# Patient Record
Sex: Male | Born: 1955
Health system: Southern US, Community
[De-identification: ages and names within clinical notes are randomized; demographics above are authoritative.]

## PROBLEM LIST (undated history)

## (undated) DIAGNOSIS — K269 Duodenal ulcer, unspecified as acute or chronic, without hemorrhage or perforation: Principal | ICD-10-CM

## (undated) DIAGNOSIS — H269 Unspecified cataract: Secondary | ICD-10-CM

## (undated) DIAGNOSIS — I2510 Atherosclerotic heart disease of native coronary artery without angina pectoris: Secondary | ICD-10-CM

## (undated) DIAGNOSIS — D126 Benign neoplasm of colon, unspecified: Secondary | ICD-10-CM

## (undated) DIAGNOSIS — K259 Gastric ulcer, unspecified as acute or chronic, without hemorrhage or perforation: Secondary | ICD-10-CM

## (undated) DIAGNOSIS — Z86010 Personal history of colonic polyps: Secondary | ICD-10-CM

## (undated) DIAGNOSIS — B9681 Helicobacter pylori [H. pylori] as the cause of diseases classified elsewhere: Secondary | ICD-10-CM

## (undated) DIAGNOSIS — B360 Pityriasis versicolor: Secondary | ICD-10-CM

## (undated) DIAGNOSIS — E785 Hyperlipidemia, unspecified: Secondary | ICD-10-CM

## (undated) DIAGNOSIS — I219 Acute myocardial infarction, unspecified: Secondary | ICD-10-CM

## (undated) HISTORY — DX: Gastric ulcer, unspecified as acute or chronic, without hemorrhage or perforation: K25.9

## (undated) HISTORY — DX: Personal history of colonic polyps: Z86.010

## (undated) HISTORY — DX: Benign neoplasm of colon, unspecified: D12.6

## (undated) HISTORY — PX: COLONOSCOPY W/ POLYPECTOMY: SHX1380

## (undated) HISTORY — PX: POLYPECTOMY: SHX149

## (undated) HISTORY — DX: Unspecified cataract: H26.9

## (undated) HISTORY — DX: Pityriasis versicolor: B36.0

## (undated) HISTORY — DX: Hyperlipidemia, unspecified: E78.5

## (undated) HISTORY — PX: COLONOSCOPY: SHX174

## (undated) HISTORY — DX: Duodenal ulcer, unspecified as acute or chronic, without hemorrhage or perforation: K26.9

## (undated) HISTORY — PX: CORONARY STENT PLACEMENT: SHX1402

## (undated) HISTORY — DX: Helicobacter pylori (H. pylori) as the cause of diseases classified elsewhere: B96.81

## (undated) HISTORY — DX: Atherosclerotic heart disease of native coronary artery without angina pectoris: I25.10

## (undated) HISTORY — PX: OTHER SURGICAL HISTORY: SHX169

---
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
## 2007-11-05 ENCOUNTER — Ambulatory Visit: Payer: Self-pay | Admitting: Internal Medicine

## 2007-11-05 DIAGNOSIS — B360 Pityriasis versicolor: Secondary | ICD-10-CM | POA: Insufficient documentation

## 2007-11-05 DIAGNOSIS — E785 Hyperlipidemia, unspecified: Secondary | ICD-10-CM

## 2007-11-08 LAB — CONVERTED CEMR LAB
Cholesterol: 238 mg/dL (ref 0–200)
HDL: 31.6 mg/dL — ABNORMAL LOW (ref >39.0)
Total CHOL/HDL Ratio: 7.5
Triglycerides: 355 mg/dL (ref 0–149)

## 2008-08-03 ENCOUNTER — Ambulatory Visit: Payer: Self-pay | Admitting: Family Medicine

## 2008-08-09 ENCOUNTER — Ambulatory Visit: Payer: Self-pay | Admitting: Family Medicine

## 2008-11-24 DIAGNOSIS — D126 Benign neoplasm of colon, unspecified: Secondary | ICD-10-CM

## 2008-11-24 HISTORY — DX: Benign neoplasm of colon, unspecified: D12.6

## 2008-12-22 ENCOUNTER — Ambulatory Visit: Payer: Self-pay | Admitting: Internal Medicine

## 2008-12-28 ENCOUNTER — Ambulatory Visit: Payer: Self-pay | Admitting: Internal Medicine

## 2008-12-28 LAB — CONVERTED CEMR LAB: Fecal Occult Bld: POSITIVE — AB

## 2009-01-15 ENCOUNTER — Ambulatory Visit: Payer: Self-pay | Admitting: Gastroenterology

## 2009-01-26 ENCOUNTER — Encounter: Payer: Self-pay | Admitting: Gastroenterology

## 2009-01-26 ENCOUNTER — Ambulatory Visit: Payer: Self-pay | Admitting: Gastroenterology

## 2009-01-26 DIAGNOSIS — Z86010 Personal history of colonic polyps: Secondary | ICD-10-CM

## 2009-01-26 DIAGNOSIS — Z860101 Personal history of adenomatous and serrated colon polyps: Secondary | ICD-10-CM | POA: Insufficient documentation

## 2009-01-26 HISTORY — DX: Personal history of adenomatous and serrated colon polyps: Z86.0101

## 2009-01-26 HISTORY — DX: Personal history of colonic polyps: Z86.010

## 2009-01-26 LAB — HM COLONOSCOPY

## 2009-02-01 ENCOUNTER — Encounter: Payer: Self-pay | Admitting: Gastroenterology

## 2009-08-03 ENCOUNTER — Telehealth: Payer: Self-pay | Admitting: Internal Medicine

## 2009-11-02 ENCOUNTER — Ambulatory Visit: Payer: Self-pay | Admitting: Family Medicine

## 2009-11-12 ENCOUNTER — Ambulatory Visit: Payer: Self-pay | Admitting: Family Medicine

## 2010-01-14 ENCOUNTER — Ambulatory Visit: Payer: Self-pay | Admitting: Internal Medicine

## 2010-01-16 LAB — CONVERTED CEMR LAB: Glucose, Bld: 80 mg/dL (ref 70–99)

## 2010-03-05 ENCOUNTER — Telehealth: Payer: Self-pay | Admitting: Internal Medicine

## 2010-03-05 ENCOUNTER — Encounter: Payer: Self-pay | Admitting: Internal Medicine

## 2010-03-11 ENCOUNTER — Ambulatory Visit: Payer: Self-pay | Admitting: Internal Medicine

## 2010-03-11 ENCOUNTER — Encounter (INDEPENDENT_AMBULATORY_CARE_PROVIDER_SITE_OTHER): Payer: Self-pay | Admitting: *Deleted

## 2010-03-12 ENCOUNTER — Encounter: Payer: Self-pay | Admitting: Internal Medicine

## 2010-03-12 LAB — CONVERTED CEMR LAB: Fecal Occult Bld: NEGATIVE

## 2010-08-28 ENCOUNTER — Telehealth: Payer: Self-pay | Admitting: Gastroenterology

## 2010-12-05 ENCOUNTER — Encounter (INDEPENDENT_AMBULATORY_CARE_PROVIDER_SITE_OTHER): Payer: Self-pay | Admitting: *Deleted

## 2010-12-17 ENCOUNTER — Encounter (INDEPENDENT_AMBULATORY_CARE_PROVIDER_SITE_OTHER): Payer: Self-pay | Admitting: *Deleted

## 2010-12-19 ENCOUNTER — Ambulatory Visit
Admission: RE | Admit: 2010-12-19 | Discharge: 2010-12-19 | Payer: Self-pay | Source: Home / Self Care | Attending: Gastroenterology | Admitting: Gastroenterology

## 2010-12-24 NOTE — Letter (Signed)
Summary: Results Follow up Letter  [place] at [facility]  [address] [place], [place] [address]   Phone: [phone]  Fax: [phone]    03/12/2010 MRN: [id number]  James [name] [address] [place], [place]  [address]  Dear Mr. ATTIG,  The following are the results of your recent test(s):  Test         Result    Pap Smear:        Normal _____  Not Normal _____ Comments: ______________________________________________________ Cholesterol: LDL(Bad cholesterol):         Your goal is less than:         HDL (Good cholesterol):       Your goal is more than: Comments:  ______________________________________________________ Mammogram:        Normal _____  Not Normal _____ Comments:  ___________________________________________________________________ Hemoccult:        Normal __X___  Not normal _______ Comments: stool test negative for blood, will plan to repeat this next year   _____________________________________________________________________ Other Tests:    We routinely do not discuss normal results over the telephone.  If you desire a copy of the results, or you have any questions about this information we can discuss them at your next office visit.   Sincerely,      Tillman Abide, MD

## 2010-12-24 NOTE — Progress Notes (Signed)
Summary: Stool test request  Phone Note Call from Patient Call back at Home Phone [phone]   Caller: Spouse Call For: [name] MD Summary of Call: Dr. Alphonsus Sias:  My husband, James [name], has yet to make an appointment for his second colonoscopy that was recommended by his GI doctor.  It took him taking that screening test last year and finding blood for him to have the first one.  So could you order a screening test for him again, so we can see if he is still passing blood.  Because if he is, this will move him to have the second colonoscopy.  Thank you in advance for all your help. Initial call taken by: Carin Primrose,  March 05, 2010 10:49 AM Caller: Carin Primrose  Follow-up for Phone Call        order done Follow-up by: [name] MD,  March 05, 2010 11:06 AM

## 2010-12-24 NOTE — Progress Notes (Signed)
Summary: Schedule Colonoscopy  Phone Note Outgoing Call Call back at [facility] Phone [phone]   Call placed by: Harlow Mares CMA Duncan Dull),  August 28, 2010 11:22 AM Call placed to: Patient Summary of Call: Left a message on patients machine to call back. patient is due for her colonoscopy Initial call taken by: Harlow Mares CMA Duncan Dull),  August 28, 2010 11:22 AM  Follow-up for Phone Call        Left a message on the patient machine to call back and schedule a previsit and procedure with our office. A letter will be mailed to the patient.   Follow-up by: Harlow Mares CMA Duncan Dull),  September 06, 2010 11:23 AM

## 2010-12-24 NOTE — Letter (Signed)
Summary: [facility]: Immunoassay Fecal Occult Blood (iFOB) Order Form  [place] at [facility]  [address] [place], [place] [address]   Phone: [phone]  Fax: [phone]      [facility]: Immunoassay Fecal Occult Blood (iFOB) Order Form   March 05, 2010 MRN: [id number]   BLAYDE BACIGALUPI 10-15-1956   Physicican Name:________[place]_________________  Diagnosis Code:________V76.51__________________      [name] MD

## 2010-12-24 NOTE — Assessment & Plan Note (Signed)
Summary: CPX / LFW   Vital Signs:  Patient profile:   55 year old male Weight:      200 pounds BMI:     28.39 Temp:     98.3 degrees F oral Pulse rate:   72 / minute Pulse rhythm:   regular BP sitting:   120 / 70  (left arm) Cuff size:   large  Vitals Entered By: Mervin Hack CMA Duncan Dull) (January 14, 2010 3:03 PM) CC: adult physical   History of Present Illness: doing okay Due for another colonoscopy this year due to multiple polyps  Allergies: No Known Drug Allergies  Past History:  Past medical, surgical, family and social histories (including risk factors) reviewed for relevance to current acute and chronic problems.  Past Medical History: Reviewed history from 12/22/2008 and no changes required. Hyperlipidemia Tinea Versicolor  Past Surgical History: Reviewed history from 11/05/2007 and no changes required. 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 Mom and Dad in good health 4 sisters No CAD, HTN, DM No colon or prostate cancer  Social History: Reviewed history from 11/05/2007 and no changes required. 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 Eyes:  Denies double vision, eye irritation, and vision loss-1 eye; stye did resolve. ENT:  Denies decreased hearing and ringing in ears; teeth okay--regular with dentist. CV:  Denies chest pain or discomfort, difficulty breathing at night, difficulty breathing while lying down, fainting, lightheadness, palpitations, and shortness of breath with exertion. Resp:  Denies cough and shortness of breath. GI:  Complains of change in bowel habits; denies abdominal pain, bloody stools, dark tarry stools, indigestion,  nausea, and vomiting; bowels took a long time to come back to normal after the colonoscopy. GU:  Complains of nocturia; denies erectile dysfunction, urinary frequency, and urinary hesitancy; only occ nocturia. MS:  Denies joint pain and joint swelling; some knee cracking  over the last 3 years . Derm:  Denies lesion(s) and rash; lump on left calf a while ago--then it resolved. Neuro:  Denies headaches, numbness, tingling, and weakness. Psych:  Denies anxiety and depression. Heme:  Denies abnormal bruising and enlarge lymph nodes. Allergy:  Denies seasonal allergies and sneezing; occ congestion since moving to [place].  Physical Exam  General:  alert and normal appearance.   Eyes:  pupils equal, pupils round, pupils reactive to light, and no optic disk abnormalities.   Ears:  R ear normal and L ear normal.   Mouth:  no erythema and no lesions.   Neck:  supple, no masses, no thyromegaly, no carotid bruits, and no cervical lymphadenopathy.   Lungs:  normal respiratory effort and normal breath sounds.   Heart:  normal rate, regular rhythm, no murmur, and no gallop.   Abdomen:  soft, non-tender, and no masses.   Rectal:  deferred--due for repeat colonoscopy Msk:  no joint tenderness and no joint swelling.   Pulses:  1+ in feet Extremities:  no edema Neurologic:  alert & oriented X3, strength normal in all extremities, and gait normal.   Skin:  no rashes and no suspicious lesions.   Axillary Nodes:  No palpable lymphadenopathy Psych:  normally interactive, good eye contact, not anxious appearing, and not depressed appearing.     Impression & Recommendations:  Problem #  1:  EXAMINATION, ROUTINE MEDICAL (ICD-V70.0) Assessment Comment Only  doing well discussed fitness will check PSA and glucose  Orders: TLB-Glucose, QUANT (82947-GLU)  Problem # 2:  HYPERLIPIDEMIA (ICD-272.4) Assessment: Comment Only discussed lifestyle measures no meds for now    HDL:31.6 (11/05/2007)   LDL:DEL (11/05/2007)  Chol:238 (11/05/2007)  Trig:355 (11/05/2007)  Other Orders: Venipuncture ([address]) TLB-PSA (Prostate Specific Antigen) (84153-PSA)  Patient Instructions: 1)  Please schedule a follow-up appointment in 1-2  years.   Prior Medications: Current Allergies (reviewed today): No known allergies

## 2010-12-24 NOTE — Letter (Signed)
Summary: Colonoscopy Letter  [facility]  [address] [place], [place] [address]   Phone: [phone]  Fax: [phone]      March 11, 2010 MRN: [id number]   James [name] [address] [place], [place]  [address]   Dear [name],   According to your medical record, it is time for you to schedule a Colonoscopy. The American Cancer Society recommends this procedure as a method to detect early colon cancer. Patients with a family history of colon cancer, or a personal history of colon polyps or inflammatory bowel disease are at increased risk.  This letter has beeen generated based on the recommendations made at the time of your procedure. If you feel that in your particular situation this may no longer apply, please contact our office.  Please call our office at [phone] to schedule this appointment or to update your records at your earliest convenience.  Thank you for cooperating with [place] to provide you with the very best care possible.   Sincerely,  Barbette Hair. Arlyce Dice, M.D.  [facility] [phone]

## 2010-12-26 ENCOUNTER — Encounter: Payer: Self-pay | Admitting: Internal Medicine

## 2010-12-26 NOTE — Letter (Signed)
Summary: Pre Visit Letter Revised  [facility]  [address] [place], [place] [address]   Phone: [phone]  Fax: [phone]        12/05/2010 MRN: [id number] THEORDORE CISNERO [address] [place], [place]  [address]             Procedure Date:  01-02-11   Welcome to the Gastroenterology Division at [facility].    You are scheduled to see a nurse for your pre-procedure visit on 12-19-10 at 8:00a.m. on the 3rd floor at [facility], [address]  We ask that you try to arrive at our office 15 minutes prior to your appointment time to allow for check-in.  Please take a minute to review the attached form.  If you answer "Yes" to one or more of the questions on the first page, we ask that you call the person listed at your earliest opportunity.  If you answer "No" to all of the questions, please complete the rest of the form and bring it to your appointment.    Your nurse visit will consist of discussing your medical and surgical history, your immediate family medical history, and your medications.   If you are unable to list all of your medications on the form, please bring the medication bottles to your appointment and we will list them.  We will need to be aware of both prescribed and over the counter drugs.  We will need to know exact dosage information as well.    Please be prepared to read and sign documents such as consent forms, a financial agreement, and acknowledgement forms.  If necessary, and with your consent, a friend or relative is welcome to sit-in on the nurse visit with you.  Please bring your insurance card so that we may make a copy of it.  If your insurance requires a referral to see a specialist, please bring your referral form from your primary care physician.  No co-pay is required for this nurse visit.     If you cannot keep your appointment, please call [phone] to cancel or reschedule prior to your appointment date.  This allows  [place] the opportunity to schedule an appointment for another patient in need of care.    Thank you for choosing [facility] for your medical needs.  We appreciate the opportunity to care for you.  Please visit [place] at our website  to learn more about our practice.  Sincerely, The Gastroenterology Division

## 2010-12-26 NOTE — Letter (Signed)
Summary: [facility] Instructions  [facility]  [address] [place], [place] [address]   Phone: [phone]  Fax: [phone]       James [name]    [birth year]    MRN: [id number]        Procedure Day /Date:  Thursday 01/02/2011      Arrival Time:  9:00 am      Procedure Time: 10:00 am     Location of Procedure:                    _x _  [facility] (4th Floor)                        PREPARATION FOR COLONOSCOPY WITH MOVIPREP   Starting 5 days prior to your procedure Saturday 2/4 do not eat nuts, seeds, popcorn, corn, beans, peas,  salads, or any raw vegetables.  Do not take any fiber supplements (e.g. Metamucil, Citrucel, and Benefiber).  THE DAY BEFORE YOUR PROCEDURE         DATE: Wednesday 2/8  1.  Drink clear liquids the entire day-NO SOLID FOOD  2.  Do not drink anything colored red or purple.  Avoid juices with pulp.  No orange juice.  3.  Drink at least 64 oz. (8 glasses) of fluid/clear liquids during the day to prevent dehydration and help the prep work efficiently.  CLEAR LIQUIDS INCLUDE: Water Jello Ice Popsicles Tea (sugar ok, no milk/cream) Powdered fruit flavored drinks Coffee (sugar ok, no milk/cream) Gatorade Juice: apple, white grape, white cranberry  Lemonade Clear bullion, consomm, broth Carbonated beverages (any kind) Strained chicken noodle soup Hard Candy                             4.  In the morning, mix first dose of MoviPrep solution:    Empty 1 Pouch A and 1 Pouch B into the disposable container    Add lukewarm drinking water to the top line of the container. Mix to dissolve    Refrigerate (mixed solution should be used within 24 hrs)  5.  Begin drinking the prep at 5:00 p.m. The MoviPrep container is divided by 4 marks.   Every 15 minutes drink the solution down to the next mark (approximately 8 oz) until the full liter is complete.   6.  Follow completed prep with 16 oz of clear liquid of your choice  (Nothing red or purple).  Continue to drink clear liquids until bedtime.  7.  Before going to bed, mix second dose of MoviPrep solution:    Empty 1 Pouch A and 1 Pouch B into the disposable container    Add lukewarm drinking water to the top line of the container. Mix to dissolve    Refrigerate  THE DAY OF YOUR PROCEDURE      DATE: Wednesday 2/8  Beginning at 5:00 a.m. (5 hours before procedure):         1. Every 15 minutes, drink the solution down to the next mark (approx 8 oz) until the full liter is complete.  2. Follow completed prep with 16 oz. of clear liquid of your choice.    3. You may drink clear liquids until 8:00 am (2 HOURS BEFORE PROCEDURE).   MEDICATION INSTRUCTIONS  Unless otherwise instructed, you should take regular prescription medications with a small sip of water   as early as possible the  morning of your procedure.           OTHER INSTRUCTIONS  You will need a responsible adult at least 57 years of age to accompany you and drive you home.   This person must remain in the waiting room during your procedure.  Wear loose fitting clothing that is easily removed.  Leave jewelry and other valuables at home.  However, you may wish to bring a book to read or  an iPod/MP3 player to listen to music as you wait for your procedure to start.  Remove all body piercing jewelry and leave at home.  Total time from sign-in until discharge is approximately 2-3 hours.  You should go home directly after your procedure and rest.  You can resume normal activities the  day after your procedure.  The day of your procedure you should not:   Drive   Make legal decisions   Operate machinery   Drink alcohol   Return to work  You will receive specific instructions about eating, activities and medications before you leave.    The above instructions have been reviewed and explained to me by   Ezra Sites RN  December 19, 2010 8:08 AM     I fully understand  and can verbalize these instructions _____________________________ Date _________

## 2010-12-26 NOTE — Miscellaneous (Signed)
Summary: [facility]  Clinical Lists Changes  Medications: Added new medication of MOVIPREP 100 GM  SOLR (PEG-KCL-NACL-NASULF-NA ASC-C) As per prep instructions. - Signed Rx of MOVIPREP 100 GM  SOLR (PEG-KCL-NACL-NASULF-NA ASC-C) As per prep instructions.;  #1 x 0;  Signed;  Entered by: Ezra Sites RN;  Authorized by: Louis Meckel MD;  Method used: Electronically to [facility] [address], [place], [place], [place]  [address], Ph: [phone], Fax: [phone] Observations: Added new observation of NKA: T (12/19/2010 7:55)    Prescriptions: MOVIPREP 100 GM  SOLR (PEG-KCL-NACL-NASULF-NA ASC-C) As per prep instructions.  #1 x 0   Entered by:   Ezra Sites RN   Authorized by:   Louis Meckel MD   Signed by:   Ezra Sites RN on 12/19/2010   Method used:   Electronically to        [facility] (retail)       [address], [address]       [place], [place]  [address]       Ph: [phone]       Fax: [phone]   RxID:   [id number]

## 2011-01-02 ENCOUNTER — Other Ambulatory Visit: Payer: Self-pay | Admitting: Gastroenterology

## 2011-01-02 ENCOUNTER — Other Ambulatory Visit (AMBULATORY_SURGERY_CENTER): Payer: BC Managed Care – PPO | Admitting: Gastroenterology

## 2011-01-02 DIAGNOSIS — D126 Benign neoplasm of colon, unspecified: Secondary | ICD-10-CM

## 2011-01-02 DIAGNOSIS — Z1211 Encounter for screening for malignant neoplasm of colon: Secondary | ICD-10-CM

## 2011-01-02 DIAGNOSIS — Z86010 Personal history of colonic polyps: Secondary | ICD-10-CM

## 2011-01-09 ENCOUNTER — Encounter: Payer: Self-pay | Admitting: Gastroenterology

## 2011-01-09 NOTE — Procedures (Addendum)
Summary: Colonoscopy  Patient: James [name] Note: All result statuses are Final unless otherwise noted.  Tests: (1) Colonoscopy (COL)   COL Colonoscopy           DONE     [facility]     [address]     [place], [place]  [address]           COLONOSCOPY PROCEDURE REPORT           PATIENT:  James [name]  MR#:  [id number]     BIRTHDATE:  1956/02/28, 54 yrs. old  GENDER:  male           ENDOSCOPIST:  Barbette Hair. Arlyce Dice, MD     Referred by:           PROCEDURE DATE:  01/02/2011     PROCEDURE:  Colonoscopy with polypectomy and submucosal injection,     Colonoscopy with snare polypectomy     ASA CLASS:  Class II     INDICATIONS:  1) screening  2) history of pre-cancerous     (adenomatous) colon polyps Index polypectomy 2010           MEDICATIONS:   Fentanyl 50 mcg IV, Versed 8 mg IV           DESCRIPTION OF PROCEDURE:   After the risks benefits and     alternatives of the procedure were thoroughly explained, informed     consent was obtained.  Digital rectal exam was performed and     revealed no abnormalities.   The LB 180AL [id number] endoscope was     introduced through the anus and advanced to the cecum, which was     identified by both the appendix and ileocecal valve, without     limitations.  The quality of the prep was Moviprep fair.  The     instrument was then slowly withdrawn as the colon was fully     examined.<<PROCEDUREIMAGES>>           FINDINGS:  A sessile polyp was found in the descending colon. It     was 2.5 cm in size. submucosal injection 8cc NS injected, followed     by piecemeal polypectomy with hot polypectomy snare (see image10     and image14).  A sessile polyp was found in the sigmoid colon. It     was 4 mm in size. It was found 15 cm from the point of entry.     Polyp was snared without cautery. Retrieval was successful (see     image18). snare polyp  This was otherwise a normal examination of     the colon (see image3, image5, image7,  image8, image17, and     image19).   Retroflexed views in the rectum revealed no     abnormalities.    The time to cecum =  6.50  minutes. The scope     was then withdrawn (time =  20.0  min) from the patient and the     procedure completed.           COMPLICATIONS:  None           ENDOSCOPIC IMPRESSION:     1) 2.5 cm sessile polyp in the descending colon     2) Otherwise normal examination     RECOMMENDATIONS:     1) If the polyp(s) removed today are proven to be adenomatous     (pre-cancerous) polyps, you will need a repeat colonoscopy in  3     years. Otherwise you should repeat colonoscopy in 7 years.           REPEAT EXAM:   You will receive a letter from Dr. Arlyce Dice in 1-2     weeks, after reviewing the final pathology, with followup     recommendations.           ______________________________     Barbette Hair Arlyce Dice, MD           CC:  Karie Schwalbe, MD           n.     Rosalie Doctor:   Barbette Hair. Kaplan at 01/02/2011 10:50 AM           Cornelious Bryant, [phone]  Note: An exclamation mark (!) indicates a result that was not dispersed into the flowsheet. Document Creation Date: 01/02/2011 10:50 AM _______________________________________________________________________  (1) Order result status: Final Collection or observation date-time: 01/02/2011 10:39 Requested date-time:  Receipt date-time:  Reported date-time:  Referring Physician:   Ordering Physician: Melvia Heaps [phone]) Specimen Source:  Source: Launa Grill Order Number: [id number] Lab site:   Appended Document: Colonoscopy     Procedures Next Due Date:    Colonoscopy: 12/2013

## 2011-01-15 NOTE — Letter (Signed)
Summary: Patient Notice- Polyp Results  [facility]  [address] [place], [place] [address]   Phone: [phone]  Fax: [phone]        January 09, 2011 MRN: [id number]    ZARION OLIFF [address] [place], [place]  [address]    Dear Mr. BASSO,  I am pleased to inform you that the colon polyp(s) removed during your recent colonoscopy was (were) found to be benign (no cancer detected) upon pathologic examination.  I recommend you have a repeat colonoscopy examination in 3_ years to look for recurrent polyps, as having colon polyps increases your risk for having recurrent polyps or even colon cancer in the future.  Should you develop new or worsening symptoms of abdominal pain, bowel habit changes or bleeding from the rectum or bowels, please schedule an evaluation with either your primary care physician or with me.  Additional information/recommendations:  __ No further action with gastroenterology is needed at this time. Please      follow-up with your primary care physician for your other healthcare      needs.  __ Please call [phone] to schedule a return visit to review your      situation.  __ Please keep your follow-up visit as already scheduled.  _x_ Continue treatment plan as outlined the day of your exam.  Please call us if you are having persistent problems or have questions about your condition that have not been fully answered at this time.  Sincerely,  Louis Meckel MD  This letter has been electronically signed by your physician.  Appended Document: Patient Notice- Polyp Results letter mailed

## 2011-02-12 ENCOUNTER — Encounter: Payer: Self-pay | Admitting: Internal Medicine

## 2011-03-14 ENCOUNTER — Other Ambulatory Visit: Payer: Self-pay | Admitting: Internal Medicine

## 2011-09-27 ENCOUNTER — Other Ambulatory Visit: Payer: Self-pay | Admitting: Internal Medicine

## 2012-07-29 ENCOUNTER — Encounter: Payer: BC Managed Care – PPO | Admitting: Internal Medicine

## 2012-08-03 ENCOUNTER — Encounter: Payer: Self-pay | Admitting: Internal Medicine

## 2012-08-03 ENCOUNTER — Ambulatory Visit (INDEPENDENT_AMBULATORY_CARE_PROVIDER_SITE_OTHER): Payer: BC Managed Care – PPO | Admitting: Internal Medicine

## 2012-08-03 VITALS — BP 102/68 | HR 57 | Temp 97.8°F | Ht 70.0 in | Wt 192.0 lb

## 2012-08-03 DIAGNOSIS — B360 Pityriasis versicolor: Secondary | ICD-10-CM

## 2012-08-03 DIAGNOSIS — Z0000 Encounter for general adult medical examination without abnormal findings: Secondary | ICD-10-CM | POA: Insufficient documentation

## 2012-08-03 DIAGNOSIS — E785 Hyperlipidemia, unspecified: Secondary | ICD-10-CM

## 2012-08-03 LAB — CBC WITH DIFFERENTIAL/PLATELET
Basophils Relative: 0.9 % (ref 0.0–3.0)
Eosinophils Absolute: 0.1 10*3/uL (ref 0.0–0.7)
Eosinophils Relative: 1.6 % (ref 0.0–5.0)
HCT: 45.9 % (ref 39.0–52.0)
Hemoglobin: 14.5 g/dL (ref 13.0–17.0)
MCHC: 31.7 g/dL (ref 30.0–36.0)
MCV: 90.2 fl (ref 78.0–100.0)
Monocytes Absolute: 0.5 10*3/uL (ref 0.1–1.0)
Neutro Abs: 1.7 10*3/uL (ref 1.4–7.7)
RBC: 5.08 Mil/uL (ref 4.22–5.81)
WBC: 4.4 10*3/uL — ABNORMAL LOW (ref 4.5–10.5)

## 2012-08-03 LAB — HEPATIC FUNCTION PANEL: Albumin: 4 g/dL (ref 3.5–5.2)

## 2012-08-03 LAB — LIPID PANEL
Cholesterol: 204 mg/dL — ABNORMAL HIGH (ref 0–200)
Total CHOL/HDL Ratio: 4

## 2012-08-03 LAB — BASIC METABOLIC PANEL
CO2: 27 mEq/L (ref 19–32)
Chloride: 108 mEq/L (ref 96–112)
Potassium: 4.3 mEq/L (ref 3.5–5.1)
Sodium: 142 mEq/L (ref 135–145)

## 2012-08-03 LAB — LDL CHOLESTEROL, DIRECT: Direct LDL: 138 mg/dL

## 2012-08-03 MED ORDER — SELENIUM SULFIDE 2.5 % EX LOTN: TOPICAL_LOTION | Freq: Every day | CUTANEOUS | Status: DC | PRN

## 2012-08-03 NOTE — Assessment & Plan Note (Signed)
Discussed primary prevention Will hold off 

## 2012-08-03 NOTE — Assessment & Plan Note (Signed)
Continue selenium prn

## 2012-08-03 NOTE — Progress Notes (Signed)
Subjective:    Patient ID: James [name], male    DOB: 1956/08/19, 56 y.o.   MRN: [id number]  HPI Here for physical Wife is here He is concerned about hearing--some muffling. Wouldn't consider hearing aides at this point. No problems at work.  No current outpatient prescriptions on file prior to visit.    No Known Allergies  Past Medical History  Diagnosis Date  . Hyperlipidemia   . Tinea versicolor     No past surgical history on file.  No family history on file.  History   Social History  . Marital Status: Married    Spouse Name: N/A    Number of Children: 2  . Years of Education: N/A   Occupational History  . Wellsite geologist    Social History Main Topics  . Smoking status: Former Smoker    Quit date: 11/24/1978  . Smokeless tobacco: Never Used  . Alcohol Use: Yes  . Drug Use: Not on file  . Sexually Active: Not on file   Other Topics Concern  . Not on file   Social History Narrative   Former minor league shortstop   Review of Systems  Constitutional: Negative for fatigue and unexpected weight change.       Has been working out some more Down 8# since last visit  HENT: Positive for hearing loss. Negative for congestion, rhinorrhea, dental problem and tinnitus.        Regular with dentist  Eyes: Negative for visual disturbance.       No diplopia or unilateral vision loss  Respiratory: Negative for cough, chest tightness and shortness of breath.   Cardiovascular: Negative for chest pain, palpitations and leg swelling.  Gastrointestinal: Positive for constipation. Negative for nausea, vomiting, abdominal pain and blood in stool.       No heartburn Got constipated after the colonoscopy---now back to normal again  Genitourinary: Negative for urgency, frequency and difficulty urinating.       No sexual problems  Musculoskeletal: Negative for back pain, joint swelling and arthralgias.  Skin: Positive for rash.       Same rash--uses selenium    Neurological: Negative for dizziness, syncope, weakness, light-headedness, numbness and headaches.  Hematological: Negative for adenopathy. Does not bruise/bleed easily.  Psychiatric/Behavioral: Negative for disturbed wake/sleep cycle and dysphoric mood. The patient is not nervous/anxious.        Objective:   Physical Exam  Constitutional: He is oriented to person, place, and time. He appears well-developed and well-nourished. No distress.  HENT:  Head: Normocephalic and atraumatic.  Right Ear: External ear normal.  Left Ear: External ear normal.  Mouth/Throat: Oropharynx is clear and moist. No oropharyngeal exudate.  Eyes: Conjunctivae and EOM are normal. Pupils are equal, round, and reactive to light.  Neck: Normal range of motion. Neck supple. No thyromegaly present.  Cardiovascular: Normal rate, regular rhythm, normal heart sounds and intact distal pulses.  Exam reveals no gallop.   No murmur heard. Pulmonary/Chest: Effort normal and breath sounds normal. No respiratory distress. He has no wheezes. He has no rales.  Abdominal: Soft. There is no tenderness.  Musculoskeletal: Normal range of motion. He exhibits no edema and no tenderness.  Lymphadenopathy:    He has no cervical adenopathy.  Neurological: He is alert and oriented to person, place, and time.  Skin: No rash noted. No erythema.  Psychiatric: He has a normal mood and affect. His behavior is normal. Thought content normal.  Assessment & Plan:

## 2012-08-03 NOTE — Assessment & Plan Note (Signed)
Healthy Discussed PSA---will check Monitor hearing---ENT referral if he worsens

## 2012-08-05 ENCOUNTER — Encounter: Payer: Self-pay | Admitting: *Deleted

## 2012-12-03 ENCOUNTER — Encounter: Payer: BC Managed Care – PPO | Admitting: Internal Medicine

## 2013-07-20 ENCOUNTER — Encounter: Payer: Self-pay | Admitting: Family Medicine

## 2013-07-20 ENCOUNTER — Ambulatory Visit (INDEPENDENT_AMBULATORY_CARE_PROVIDER_SITE_OTHER)
Admission: RE | Admit: 2013-07-20 | Discharge: 2013-07-20 | Disposition: A | Discharge status: Home / Self Care | Payer: BC Managed Care – PPO | Source: Ambulatory Visit | Attending: Family Medicine | Admitting: Family Medicine

## 2013-07-20 ENCOUNTER — Ambulatory Visit (INDEPENDENT_AMBULATORY_CARE_PROVIDER_SITE_OTHER): Payer: BC Managed Care – PPO | Admitting: Family Medicine

## 2013-07-20 VITALS — BP 118/70 | HR 84 | Temp 98.4°F | Wt 199.0 lb

## 2013-07-20 DIAGNOSIS — S6980XA Other specified injuries of unspecified wrist, hand and finger(s), initial encounter: Secondary | ICD-10-CM

## 2013-07-20 DIAGNOSIS — S6990XA Unspecified injury of unspecified wrist, hand and finger(s), initial encounter: Secondary | ICD-10-CM | POA: Insufficient documentation

## 2013-07-20 DIAGNOSIS — S6991XA Unspecified injury of right wrist, hand and finger(s), initial encounter: Secondary | ICD-10-CM

## 2013-07-20 NOTE — Patient Instructions (Addendum)
Keep the splint on and ask about seeing Shirlee Limerick about your referral before you leave today.

## 2013-07-20 NOTE — Assessment & Plan Note (Signed)
Likely extensor tendon deficit, no fx seen.   Splinted, advised to keep on all the time.   Tolerated well.  If has to remove, then keep finger in extension.  F/u with ortho.  He agrees.  No neurovascular deficit.

## 2013-07-20 NOTE — Progress Notes (Signed)
Jammed R 3rd finger today.  Puffy.  No AROM extension of the R 3rd DIP. No other finder pain.    Meds, vitals, and allergies reviewed.   ROS: See HPI.  Otherwise, noncontributory.  nad R hand with normal inspection except for distal swelling on the 3rd finger.   NV intact but unable to extend the R3rd DIP.  No tendon deficit o/w in the hand for flex or ext.   xrays reviewed.

## 2013-09-29 ENCOUNTER — Other Ambulatory Visit: Payer: Self-pay

## 2013-11-07 ENCOUNTER — Encounter: Payer: Self-pay | Admitting: *Deleted

## 2013-11-09 ENCOUNTER — Encounter: Payer: Self-pay | Admitting: Gastroenterology

## 2014-01-11 ENCOUNTER — Encounter: Payer: Self-pay | Admitting: Gastroenterology

## 2014-03-03 ENCOUNTER — Ambulatory Visit (AMBULATORY_SURGERY_CENTER): Payer: Self-pay | Admitting: *Deleted

## 2014-03-03 VITALS — Ht 70.0 in | Wt 207.0 lb

## 2014-03-03 DIAGNOSIS — Z86010 Personal history of colonic polyps: Secondary | ICD-10-CM

## 2014-03-03 MED ORDER — NA SULFATE-K SULFATE-MG SULF 17.5-3.13-1.6 GM/177ML PO SOLN: 1.0000 | Freq: Once | ORAL | Status: DC

## 2014-03-03 NOTE — Progress Notes (Signed)
No allergies to eggs or soy. No problems with anesthesia.  Pt given Emmi instructions for colonoscopy  

## 2014-03-17 ENCOUNTER — Encounter: Payer: Self-pay | Admitting: Gastroenterology

## 2014-03-17 ENCOUNTER — Ambulatory Visit (AMBULATORY_SURGERY_CENTER): Payer: BC Managed Care – PPO | Admitting: Gastroenterology

## 2014-03-17 VITALS — BP 126/76 | HR 46 | Temp 95.8°F | Resp 18 | Ht 70.0 in | Wt 207.0 lb

## 2014-03-17 DIAGNOSIS — Z86010 Personal history of colonic polyps: Secondary | ICD-10-CM

## 2014-03-17 DIAGNOSIS — K5730 Diverticulosis of large intestine without perforation or abscess without bleeding: Secondary | ICD-10-CM

## 2014-03-17 DIAGNOSIS — D126 Benign neoplasm of colon, unspecified: Secondary | ICD-10-CM

## 2014-03-17 MED ORDER — SODIUM CHLORIDE 0.9 % IV SOLN
500.0000 mL | INTRAVENOUS | Status: DC
Start: 2014-03-17 — End: 2014-03-20

## 2014-03-17 NOTE — Progress Notes (Signed)
Called to room to assist during endoscopic procedure.  Patient ID and intended procedure confirmed with present staff. Received instructions for my participation in the procedure from the performing physician.  

## 2014-03-17 NOTE — Progress Notes (Signed)
  [facility] Post-op Note  Patient: James [name]  Procedure(s) Performed: colonoscopy  Patient Location: [facility] - Recovery Area  Anesthesia Type: Deep Sedation/Propofol  Level of Consciousness: awake, oriented and patient cooperative  Airway and Oxygen Therapy: Patient Spontanous Breathing  Post-op Pain: none  Post-op Assessment:  Post-op Vital signs reviewed, Patient's Cardiovascular Status Stable, Respiratory Function Stable, Patent Airway, No signs of Nausea or vomiting and Pain level controlled  Post-op Vital Signs: Reviewed and stable  Complications: No apparent anesthesia complications  Trampas Stettner E Stryder Poitra 2:44 PM

## 2014-03-17 NOTE — Patient Instructions (Signed)
Information sheet given on polyps and diverticulosis.  YOU HAD AN ENDOSCOPIC PROCEDURE TODAY AT [facility]: Refer to the procedure report that was given to you for any specific questions about what was found during the examination.  If the procedure report does not answer your questions, please call your gastroenterologist to clarify.  If you requested that your care partner not be given the details of your procedure findings, then the procedure report has been included in a sealed envelope for you to review at your convenience later.  YOU SHOULD EXPECT: Some feelings of bloating in the abdomen. Passage of more gas than usual.  Walking can help get rid of the air that was put into your GI tract during the procedure and reduce the bloating. If you had a lower endoscopy (such as a colonoscopy or flexible sigmoidoscopy) you may notice spotting of blood in your stool or on the toilet paper. If you underwent a bowel prep for your procedure, then you may not have a normal bowel movement for a few days.  DIET: Your first meal following the procedure should be a light meal and then it is ok to progress to your normal diet.  A half-sandwich or bowl of soup is an example of a good first meal.  Heavy or fried foods are harder to digest and may make you feel nauseous or bloated.  Likewise meals heavy in dairy and vegetables can cause extra gas to form and this can also increase the bloating.  Drink plenty of fluids but you should avoid alcoholic beverages for 24 hours.  ACTIVITY: Your care partner should take you home directly after the procedure.  You should plan to take it easy, moving slowly for the rest of the day.  You can resume normal activity the day after the procedure however you should NOT DRIVE or use heavy machinery for 24 hours (because of the sedation medicines used during the test).    SYMPTOMS TO REPORT IMMEDIATELY: A gastroenterologist can be reached at any hour.  During normal  business hours, 8:30 AM to 5:00 PM Monday through Friday, call [phone].  After hours and on weekends, please call the GI answering service at [phone] who will take a message and have the physician on call contact you.   Following lower endoscopy (colonoscopy or flexible sigmoidoscopy):  Excessive amounts of blood in the stool  Significant tenderness or worsening of abdominal pains  Swelling of the abdomen that is new, acute   FOLLOW UP: If any biopsies were taken you will be contacted by phone or by letter within the next 1-3 weeks.  Call your gastroenterologist if you have not heard about the biopsies in 3 weeks.  Our staff will call the home number listed on your records the next business day following your procedure to check on you and address any questions or concerns that you may have at that time regarding the information given to you following your procedure. This is a courtesy call and so if there is no answer at the home number and we have not heard from you through the emergency physician on call, we will assume that you have returned to your regular daily activities without incident.  SIGNATURES/CONFIDENTIALITY: You and/or your care partner have signed paperwork which will be entered into your electronic medical record.  These signatures attest to the fact that that the information above on your After Visit Summary has been reviewed and is understood.  Full responsibility of the  confidentiality of this discharge information lies with you and/or your care-partner. 

## 2014-03-17 NOTE — Op Note (Signed)
[place]  [address] [place], [id number]   COLONOSCOPY PROCEDURE REPORT  PATIENT: Eugune, Sine  MR#: [id number] BIRTHDATE: 1956/01/01 , 19  yrs. old GENDER: Male ENDOSCOPIST: Inda Castle, MD REFERRED BY: PROCEDURE DATE:  03/17/2014 PROCEDURE:   Colonoscopy with snare polypectomy and Colonoscopy with cold biopsy polypectomy First Screening Colonoscopy - Avg.  risk and is 50 yrs.  old or older - No.  Prior Negative Screening - Now for repeat screening. N/A  History of Adenoma - Now for follow-up colonoscopy & has been > or = to 3 yrs.  Yes hx of adenoma.  Has been 3 or more years since last colonoscopy.  Polyps Removed Today? Yes. ASA CLASS:   Class II INDICATIONS:Patient's personal history of colon polyps.2010, 2012 MEDICATIONS: MAC sedation, administered by CRNA and propofol (Diprivan) 350mg  IV  DESCRIPTION OF PROCEDURE:   After the risks benefits and alternatives of the procedure were thoroughly explained, informed consent was obtained.  A digital rectal exam revealed no abnormalities of the rectum.   The LB [id number]  endoscope was introduced through the anus and advanced to the cecum, which was identified by both the appendix and ileocecal valve. No adverse events experienced.   The quality of the prep was Suprep good  The instrument was then slowly withdrawn as the colon was fully examined.      COLON FINDINGS: Two sessile polyps ranging between 5-64mm in size were found in the proximal transverse colon.  A polypectomy was performed.  A polypectomy was performed with a cold snare.  The resection was complete and the polyp tissue was completely retrieved.   A sessile polyp measuring 1 mm in size was found at the cecum.  A polypectomy was performed with cold forceps.   A sessile polyp measuring 5 mm in size was found in the proximal descending colon.  A polypectomy was performed with a cold snare. The resection was complete and the  polyp tissue was completely retrieved.   Moderate diverticulosis was noted in the sigmoid colon.  Retroflexed views revealed no abnormalities. The time to cecum=9 minutes 31 seconds.  Withdrawal time=14 minutes 15 seconds. The scope was withdrawn and the procedure completed. COMPLICATIONS: There were no complications.  ENDOSCOPIC IMPRESSION: 1.   Two sessile polyps ranging between 5-76mm in size were found in the proximal transverse colon; polypectomy was performed; polypectomy was performed with a cold snare 2.   Sessile polyp measuring 1 mm in size was found at the cecum; polypectomy was performed with cold forceps 3.   Sessile polyp measuring 5 mm in size was found in the proximal descending colon; polypectomy was performed with a cold snare 4.   Moderate diverticulosis was noted in the sigmoid colon  RECOMMENDATIONS: Colonoscopy 3 years   eSigned:  Inda Castle, MD 03/17/2014 2:47 PM   cc: Venia Carbon, MD and Marylynn Pearson MD   PATIENT NAME:  Lochlin, Eppinger MR#: [id number]

## 2014-03-20 ENCOUNTER — Telehealth: Payer: Self-pay

## 2014-03-20 NOTE — Telephone Encounter (Signed)
Left a message on recorder at [phone] for the pt to call if any questions or concerns. Maw

## 2014-03-22 ENCOUNTER — Encounter: Payer: Self-pay | Admitting: Gastroenterology

## 2014-03-24 ENCOUNTER — Encounter: Payer: Self-pay | Admitting: Internal Medicine

## 2014-03-24 ENCOUNTER — Ambulatory Visit (INDEPENDENT_AMBULATORY_CARE_PROVIDER_SITE_OTHER): Payer: BC Managed Care – PPO | Admitting: Internal Medicine

## 2014-03-24 VITALS — BP 108/70 | HR 61 | Temp 98.2°F | Wt 204.0 lb

## 2014-03-24 DIAGNOSIS — IMO0002 Reserved for concepts with insufficient information to code with codable children: Secondary | ICD-10-CM

## 2014-03-24 DIAGNOSIS — L988 Other specified disorders of the skin and subcutaneous tissue: Secondary | ICD-10-CM

## 2014-03-24 NOTE — Progress Notes (Signed)
Pre visit review using our clinic review tool, if applicable. No additional management support is needed unless otherwise documented below in the visit note. 

## 2014-03-24 NOTE — Assessment & Plan Note (Signed)
Not part of bone or on joint Reassured---seems benign Would send to hand surgeon for removal for symptoms

## 2014-03-24 NOTE — Progress Notes (Signed)
   Subjective:    Patient ID: James [name], male    DOB: 1956-10-25, 58 y.o.   MRN: [id number]  HPI Injured right middle finger-- ~2 months ago That improved   But then noticed a lump in extensor side of 4th middle phalanx No pain but irritating Usually can bend it fully  No current outpatient prescriptions on file prior to visit.   No current facility-administered medications on file prior to visit.    No Known Allergies  Past Medical History  Diagnosis Date  . Hyperlipidemia   . Tinea versicolor   . Colon polyps     Past Surgical History  Procedure Laterality Date  . Jaw tumor Left   . Tonsillectomy  1962  . Mouth tumor      Family History  Problem Relation Age of Onset  . Colon cancer Neg Hx   . Stomach cancer Neg Hx     History   Social History  . Marital Status: Married    Spouse Name: N/A    Number of Children: 2  . Years of Education: N/A   Occupational History  . Hotel manager    Social History Main Topics  . Smoking status: Former Smoker    Quit date: 11/24/1978  . Smokeless tobacco: Never Used  . Alcohol Use: Yes     Comment: 1 to 2 beers a month  . Drug Use: No  . Sexual Activity: Not on file   Other Topics Concern  . Not on file   Social History Narrative   Former minor league shortstop   Review of Systems No numbness or tingling No fever--feels well    Objective:   Physical Exam  Constitutional: He appears well-developed and well-nourished. No distress.  Musculoskeletal:  Moveable subcutaneous mass (~5-59mm) just distal to PIP in extensor right 4th finger No inflammation or redness Full ROM of finger          Assessment & Plan:

## 2014-04-10 ENCOUNTER — Encounter: Payer: Self-pay | Admitting: Internal Medicine

## 2014-04-10 ENCOUNTER — Ambulatory Visit (INDEPENDENT_AMBULATORY_CARE_PROVIDER_SITE_OTHER): Payer: BC Managed Care – PPO | Admitting: Internal Medicine

## 2014-04-10 VITALS — BP 100/62 | HR 67 | Temp 97.8°F | Wt 202.0 lb

## 2014-04-10 DIAGNOSIS — S83419A Sprain of medial collateral ligament of unspecified knee, initial encounter: Secondary | ICD-10-CM

## 2014-04-10 NOTE — Assessment & Plan Note (Signed)
No internal derangement of the knee No signs of arthritis  Suggested NSAIDs and elastic support Dr Lorelei Pont if persists

## 2014-04-10 NOTE — Patient Instructions (Signed)
Please try an elastic brace for your knee. Take aleve (naproxen 220mg ) 2 tabs twice a day for the next 1-2 weeks. If your knee is not any better, set up an appointment with Dr Lorelei Pont.

## 2014-04-10 NOTE — Progress Notes (Signed)
Pre visit review using our clinic review tool, if applicable. No additional management support is needed unless otherwise documented below in the visit note. 

## 2014-04-10 NOTE — Progress Notes (Signed)
   Subjective:    Patient ID: James [name], male    DOB: [birth year], 58 y.o.   MRN: [id number]  HPI Pain in right knee "crippling me when I walk" Occasionally hurts with standing or twisting the knee Pain over MCL or medial tibia Feels heavy there when he is walking  May have some swelling---not apparent  Started a couple of weeks but much worse in past couple of days Has been limping when first getting out of bed  Has tried massaging this--no help No meds  No injury  No current outpatient prescriptions on file prior to visit.   No current facility-administered medications on file prior to visit.    No Known Allergies  Past Medical History  Diagnosis Date  . Hyperlipidemia   . Tinea versicolor   . Colon polyps     Past Surgical History  Procedure Laterality Date  . Jaw tumor Left   . Tonsillectomy  1962  . Mouth tumor      Family History  Problem Relation Age of Onset  . Colon cancer Neg Hx   . Stomach cancer Neg Hx     History   Social History  . Marital Status: Married    Spouse Name: N/A    Number of Children: 2  . Years of Education: N/A   Occupational History  . Hotel manager    Social History Main Topics  . Smoking status: Former Smoker    Quit date: 11/24/1978  . Smokeless tobacco: Never Used  . Alcohol Use: Yes     Comment: 1 to 2 beers a month  . Drug Use: No  . Sexual Activity: Not on file   Other Topics Concern  . Not on file   Social History Narrative   Former minor league shortstop   Review of Systems No fever Has felt okay otherwise     Objective:   Physical Exam  Musculoskeletal: He exhibits no edema.  ?slight right knee effusion No ACL/PCL instability Macmurray's is negative Pain is reproduced with MCL stress Normal passive ROM          Assessment & Plan:

## 2014-07-03 ENCOUNTER — Encounter: Payer: Self-pay | Admitting: Family Medicine

## 2014-07-03 ENCOUNTER — Telehealth: Payer: Self-pay | Admitting: Internal Medicine

## 2014-07-03 ENCOUNTER — Ambulatory Visit (INDEPENDENT_AMBULATORY_CARE_PROVIDER_SITE_OTHER): Payer: BC Managed Care – PPO | Admitting: Family Medicine

## 2014-07-03 ENCOUNTER — Ambulatory Visit (INDEPENDENT_AMBULATORY_CARE_PROVIDER_SITE_OTHER)
Admission: RE | Admit: 2014-07-03 | Discharge: 2014-07-03 | Disposition: A | Discharge status: Home / Self Care | Payer: BC Managed Care – PPO | Source: Ambulatory Visit | Attending: Family Medicine | Admitting: Family Medicine

## 2014-07-03 VITALS — BP 100/68 | HR 65 | Temp 98.5°F | Ht 70.0 in | Wt 201.5 lb

## 2014-07-03 DIAGNOSIS — M25561 Pain in right knee: Secondary | ICD-10-CM

## 2014-07-03 DIAGNOSIS — S83411A Sprain of medial collateral ligament of right knee, initial encounter: Secondary | ICD-10-CM

## 2014-07-03 DIAGNOSIS — M25569 Pain in unspecified knee: Secondary | ICD-10-CM

## 2014-07-03 NOTE — Telephone Encounter (Signed)
Pt's spouse called, would like referral to [facility] orthopaedic for right knee pain prior to MRI.  Best number to call pt to schedule is [phone]

## 2014-07-03 NOTE — Progress Notes (Signed)
Pre visit review using our clinic review tool, if applicable. No additional management support is needed unless otherwise documented below in the visit note. 

## 2014-07-03 NOTE — Progress Notes (Signed)
[place] [address]                Phone: [phone]                  Fax: [phone] 07/03/2014  ID: James [name]   MRN: [id number]  DOB: 1956/01/20  Primary Physician:  Viviana Simpler, MD  Chief Complaint: Knee Pain  Subjective:   History of Present Illness:  This 58 y.o. male patient presents with 5 month h/o R sided knee pain after no clear injury. No audible pop was heard. The patient has not had an effusion. No symptomatic giving-way. No mechanical clicking. Joint has not locked up. Patient has been able to walk but is limping. The patient does have pain going up and down stairs or rising from a seated position.   Best night in 6 days, will stand up and get some pretty excruciating pain. Then will wax and feel normal. Medially jointly.   Pain location: "deep" and medial Current physical activity: active Prior Knee Surgery: none Current pain meds: rare nsaids Bracing: none  The PMH, PSH, Social History, Family History, Medications, and allergies have been reviewed in [facility], and have been updated if relevant.  ROS: no acute illness or fever MSK: above GI: tol po intake without nausea or vomitting. Neuro: no numbness, tingling, or radiculopathy O/w per hpi  Objective:   PHYSICAL EXAM  Blood pressure 100/68, pulse 65, temperature 98.5 F (36.9 C), temperature source Oral, height 5\' 10"  (1.778 m), weight 201 lb 8 oz (91.4 kg).  GEN: Well-developed,well-nourished,in no acute distress; alert,appropriate and cooperative throughout examination HEENT: Normocephalic and atraumatic without obvious abnormalities. Ears, externally no deformities PULM: Breathing comfortably in no respiratory distress EXT: No clubbing, cyanosis, or edema PSYCH: Normally interactive. Cooperative during the interview. Pleasant. Friendly and conversant. Not anxious or depressed appearing. Normal, full affect.  Gait: Normal heel toe pattern ROM:  0-110 Effusion: neg Echymosis or edema: none Patellar tendon NT Painful PLICA: neg Patellar grind: negative Medial and lateral patellar facet loading: negative medial and lateral joint lines: TTP medially Mcmurray's pos Flexion-pinch pos Varus and valgus stress: stable Lachman: neg Ant and Post drawer: neg Hip abduction, IR, ER: WNL Hip flexion str: 5/5 Hip abd: 5/5 Quad: 5/5 VMO atrophy:No Hamstring concentric and eccentric: 5/5  Radiology: Dg Knee Ap/lat W/sunrise Right  07/03/2014   CLINICAL DATA:  Knee pain for 1 week.  No known injury.  EXAM: DG KNEE - 3 VIEWS  COMPARISON:  None.  FINDINGS: The mineralization and alignment are normal. There is no evidence of acute fracture or dislocation. The joint spaces are relatively maintained. There is mild patellar spurring. The lateral view demonstrates a possible small joint effusion.  IMPRESSION: No acute osseous findings.  Possible small joint effusion.   Electronically Signed   By: Camie Patience M.D.   On: 07/03/2014 08:34    Assessment & Plan:   Right knee pain - Plan: DG Knee AP/LAT W/Sunrise Right, CANCELED: MR Knee Right Wo Contrast  >25 minutes spent in face to face time with patient, >50% spent in counselling or coordination of care: I suspect internal derangement, most likely meniscal tear. Failure 5 months of conservative management. Discussed potentially injecting knee and recheck in 3-4 weeks, patient with no interest.   Recommended MRI R knee to eval for meniscal pathology,  patient ultimately cancelled this order after leaving my office. I have given him names of Ortho Surgeons in the area, and he made his own appointment with Dr. Marlou Sa, which is certainly reasonable.   Orders Placed This Encounter  Procedures  . DG Knee AP/LAT W/Sunrise Right   Follow-up: No Follow-up on file. Unless noted above, the patient is to follow-up if symptoms worsen. Red flags were reviewed with the patient.  Signed,  [name]. Vonda Harth,  MD, [facility]  Current Medications at Discharge:   Medication List    Notice As of 07/03/2014 11:59 PM   You have not been prescribed any medications.

## 2014-07-03 NOTE — Patient Instructions (Addendum)
[facility]: Dr. Carter Kitten Dr. Nilsa Nutting  [facility]: Dr. Stefani Dama  [facility]: Dr. Hillery Aldo Dr. Netta Cedars  [facility]: Dr. Doristine Mango: [facility]: Dr. Thornton Park  [facility]): Dr. Nilsa Nutting

## 2014-07-24 ENCOUNTER — Other Ambulatory Visit: Payer: Self-pay | Admitting: Orthopedic Surgery

## 2014-07-24 DIAGNOSIS — M25561 Pain in right knee: Secondary | ICD-10-CM

## 2014-08-01 ENCOUNTER — Ambulatory Visit
Admission: RE | Admit: 2014-08-01 | Discharge: 2014-08-01 | Disposition: A | Discharge status: Home / Self Care | Payer: BC Managed Care – PPO | Source: Ambulatory Visit | Attending: Orthopedic Surgery | Admitting: Orthopedic Surgery

## 2014-08-01 DIAGNOSIS — M25561 Pain in right knee: Secondary | ICD-10-CM

## 2014-08-04 ENCOUNTER — Ambulatory Visit (INDEPENDENT_AMBULATORY_CARE_PROVIDER_SITE_OTHER): Payer: BC Managed Care – PPO | Admitting: Internal Medicine

## 2014-08-04 ENCOUNTER — Encounter: Payer: Self-pay | Admitting: Internal Medicine

## 2014-08-04 VITALS — BP 118/70 | HR 75 | Temp 97.9°F | Ht 70.0 in | Wt 200.0 lb

## 2014-08-04 DIAGNOSIS — Z0000 Encounter for general adult medical examination without abnormal findings: Secondary | ICD-10-CM

## 2014-08-04 DIAGNOSIS — R7989 Other specified abnormal findings of blood chemistry: Secondary | ICD-10-CM

## 2014-08-04 DIAGNOSIS — B360 Pityriasis versicolor: Secondary | ICD-10-CM

## 2014-08-04 DIAGNOSIS — Z125 Encounter for screening for malignant neoplasm of prostate: Secondary | ICD-10-CM

## 2014-08-04 DIAGNOSIS — E785 Hyperlipidemia, unspecified: Secondary | ICD-10-CM

## 2014-08-04 LAB — COMPREHENSIVE METABOLIC PANEL
ALK PHOS: 73 U/L (ref 39–117)
ALT: 20 U/L (ref 0–53)
AST: 22 U/L (ref 0–37)
Albumin: 3.9 g/dL (ref 3.5–5.2)
BILIRUBIN TOTAL: 0.6 mg/dL (ref 0.2–1.2)
BUN: 16 mg/dL (ref 6–23)
CO2: 26 mEq/L (ref 19–32)
CREATININE: 1.2 mg/dL (ref 0.4–1.5)
Calcium: 9.6 mg/dL (ref 8.4–10.5)
Chloride: 107 mEq/L (ref 96–112)
GFR: 80.14 mL/min (ref >60.00)
Glucose, Bld: 87 mg/dL (ref 70–99)
Potassium: 4.2 mEq/L (ref 3.5–5.1)
Sodium: 140 mEq/L (ref 135–145)
Total Protein: 6.6 g/dL (ref 6.0–8.3)

## 2014-08-04 LAB — LIPID PANEL
CHOL/HDL RATIO: 7
CHOLESTEROL: 206 mg/dL — AB (ref 0–200)
HDL: 30.1 mg/dL — ABNORMAL LOW (ref >39.00)
NonHDL: 175.9
Triglycerides: 203 mg/dL — ABNORMAL HIGH (ref 0.0–149.0)
VLDL: 40.6 mg/dL — AB (ref 0.0–40.0)

## 2014-08-04 LAB — CBC WITH DIFFERENTIAL/PLATELET
BASOS ABS: 0 10*3/uL (ref 0.0–0.1)
Basophils Relative: 0.9 % (ref 0.0–3.0)
EOS ABS: 0.1 10*3/uL (ref 0.0–0.7)
Eosinophils Relative: 1.5 % (ref 0.0–5.0)
HEMATOCRIT: 44.2 % (ref 39.0–52.0)
HEMOGLOBIN: 14.3 g/dL (ref 13.0–17.0)
LYMPHS ABS: 2.3 10*3/uL (ref 0.7–4.0)
Lymphocytes Relative: 44 % (ref 12.0–46.0)
MCHC: 32.5 g/dL (ref 30.0–36.0)
MCV: 88.7 fl (ref 78.0–100.0)
Monocytes Absolute: 0.6 10*3/uL (ref 0.1–1.0)
Monocytes Relative: 11.7 % (ref 3.0–12.0)
NEUTROS ABS: 2.2 10*3/uL (ref 1.4–7.7)
Neutrophils Relative %: 41.9 % — ABNORMAL LOW (ref 43.0–77.0)
Platelets: 228 10*3/uL (ref 150.0–400.0)
RBC: 4.98 Mil/uL (ref 4.22–5.81)
RDW: 13.3 % (ref 11.5–15.5)
WBC: 5.2 10*3/uL (ref 4.0–10.5)

## 2014-08-04 LAB — LDL CHOLESTEROL, DIRECT: Direct LDL: 136.7 mg/dL

## 2014-08-04 LAB — PSA: PSA: 1.13 ng/mL (ref 0.10–4.00)

## 2014-08-04 LAB — T4, FREE: Free T4: 0.78 ng/dL (ref 0.60–1.60)

## 2014-08-04 MED ORDER — ZOSTER VACCINE LIVE 19400 UNT/0.65ML ~~LOC~~ SOLR: 0.6500 mL | Freq: Once | SUBCUTANEOUS | Status: DC

## 2014-08-04 MED ORDER — FLUCONAZOLE 150 MG PO TABS: 300.0000 mg | ORAL_TABLET | Freq: Once | ORAL | Status: DC

## 2014-08-04 NOTE — Assessment & Plan Note (Signed)
Healthy Discussed improving fitness Doesn't want immunizations but he will check the zostavax No flu Check PSA after discussion

## 2014-08-04 NOTE — Assessment & Plan Note (Signed)
Will try oral Rx

## 2014-08-04 NOTE — Progress Notes (Signed)
Subjective:    Patient ID: James [name], male    DOB: 1956-03-17, 58 y.o.   MRN: [id number]  HPI Here for physical Wife is here  Ongoing knee problems Just had MRI--does have small meniscal tear  No other concerns, other than ongoing hearing problems Discussed reasons for further evaluation  No current outpatient prescriptions on file prior to visit.   No current facility-administered medications on file prior to visit.    No Known Allergies  Past Medical History  Diagnosis Date  . Hyperlipidemia   . Tinea versicolor   . Colon polyps     Past Surgical History  Procedure Laterality Date  . Jaw tumor Left   . Tonsillectomy  1962  . Mouth tumor      Family History  Problem Relation Age of Onset  . Colon cancer Neg Hx   . Stomach cancer Neg Hx     History   Social History  . Marital Status: Married    Spouse Name: N/A    Number of Children: 2  . Years of Education: N/A   Occupational History  . Hotel manager    Social History Main Topics  . Smoking status: Former Smoker    Quit date: 11/24/1978  . Smokeless tobacco: Never Used  . Alcohol Use: Yes     Comment: 1 to 2 beers a month  . Drug Use: No  . Sexual Activity: Not on file   Other Topics Concern  . Not on file   Social History Narrative   Former minor league shortstop   Review of Systems  Constitutional: Negative for fatigue and unexpected weight change.       Wears seat belt Some exercise--walking  HENT: Positive for hearing loss. Negative for dental problem and tinnitus.        Regular with dentist  Eyes: Negative for visual disturbance.       No diplopia or unilateral vision loss  Respiratory: Negative for cough, chest tightness and shortness of breath.   Cardiovascular: Negative for chest pain, palpitations and leg swelling.  Gastrointestinal: Negative for nausea, vomiting, abdominal pain, constipation and blood in stool.       No heartburn  Endocrine: Negative for  polydipsia and polyuria.  Genitourinary: Negative for urgency, frequency and difficulty urinating.       No sexual problems  Musculoskeletal: Positive for arthralgias. Negative for back pain and joint swelling.  Skin: Positive for rash.       Ongoing tinea versicolor--- still uses the selsun but is frustrated  Allergic/Immunologic: Negative for environmental allergies and immunocompromised state.  Neurological: Negative for dizziness, syncope, weakness, light-headedness, numbness and headaches.  Hematological: Negative for adenopathy. Does not bruise/bleed easily.  Psychiatric/Behavioral: Negative for sleep disturbance and dysphoric mood. The patient is not nervous/anxious.        Objective:   Physical Exam  Constitutional: He is oriented to person, place, and time. He appears well-developed and well-nourished. No distress.  HENT:  Head: Normocephalic and atraumatic.  Right Ear: External ear normal.  Left Ear: External ear normal.  Mouth/Throat: Oropharynx is clear and moist. No oropharyngeal exudate.  Eyes: Conjunctivae and EOM are normal. Pupils are equal, round, and reactive to light.  Neck: Normal range of motion. Neck supple. No thyromegaly present.  Cardiovascular: Normal rate, regular rhythm, normal heart sounds and intact distal pulses.  Exam reveals no gallop.   No murmur heard. Pulmonary/Chest: Effort normal and breath sounds normal. No respiratory distress. He has no wheezes.  He has no rales.  Abdominal: Soft. There is no tenderness.  Musculoskeletal: He exhibits no edema and no tenderness.  Lymphadenopathy:    He has no cervical adenopathy.  Neurological: He is alert and oriented to person, place, and time.  Skin: No erythema.  Psychiatric: He has a normal mood and affect. His behavior is normal.          Assessment & Plan:

## 2014-08-04 NOTE — Progress Notes (Signed)
Pre visit review using our clinic review tool, if applicable. No additional management support is needed unless otherwise documented below in the visit note. 

## 2014-08-04 NOTE — Assessment & Plan Note (Signed)
No primary prevention after discussion Will check levels

## 2015-01-04 ENCOUNTER — Ambulatory Visit: Payer: Self-pay | Admitting: Family Medicine

## 2015-01-04 ENCOUNTER — Ambulatory Visit (INDEPENDENT_AMBULATORY_CARE_PROVIDER_SITE_OTHER): Payer: BLUE CROSS/BLUE SHIELD | Admitting: Internal Medicine

## 2015-01-04 ENCOUNTER — Encounter: Payer: Self-pay | Admitting: Internal Medicine

## 2015-01-04 VITALS — BP 120/72 | HR 64 | Temp 98.4°F | Wt 199.8 lb

## 2015-01-04 DIAGNOSIS — H00036 Abscess of eyelid left eye, unspecified eyelid: Secondary | ICD-10-CM | POA: Insufficient documentation

## 2015-01-04 MED ORDER — CEPHALEXIN 500 MG PO CAPS: 500.0000 mg | ORAL_CAPSULE | Freq: Four times a day (QID) | ORAL | Status: DC

## 2015-01-04 NOTE — Progress Notes (Signed)
   Subjective:    Patient ID: James [name], male    DOB: 24-Oct-1956, 59 y.o.   MRN: [id number]  HPI Here due to left eye swelling  Noted some irritation in left eye about 5-6 days ago Then started swelling and got worse Tried OTC stye Rx---no help  Now having pain Some matter in eye No redness in eye  Some relief with warm compress  Current Outpatient Prescriptions on File Prior to Visit  Medication Sig Dispense Refill  . fluconazole (DIFLUCAN) 150 MG tablet Take 2 tablets (300 mg total) by mouth once. (Patient not taking: Reported on 01/04/2015) 2 tablet 1  . zoster vaccine live, PF, (ZOSTAVAX) 92426 UNT/0.65ML injection Inject 19,400 Units into the skin once. (Patient not taking: Reported on 01/04/2015) 1 each 0   No current facility-administered medications on file prior to visit.    No Known Allergies  Past Medical History  Diagnosis Date  . Hyperlipidemia   . Tinea versicolor   . Colon polyps     Past Surgical History  Procedure Laterality Date  . Jaw tumor Left   . Tonsillectomy  1962  . Mouth tumor      Family History  Problem Relation Age of Onset  . Colon cancer Neg Hx   . Stomach cancer Neg Hx     History   Social History  . Marital Status: Married    Spouse Name: N/A  . Number of Children: 2  . Years of Education: N/A   Occupational History  . Hotel manager    Social History Main Topics  . Smoking status: Former Smoker    Quit date: 11/24/1978  . Smokeless tobacco: Never Used  . Alcohol Use: Yes     Comment: 1 to 2 beers a month  . Drug Use: No  . Sexual Activity: Not on file   Other Topics Concern  . Not on file   Social History Narrative   Former minor league shortstop   Review of Systems No fever Doesn't feel sick No change in vision    Objective:   Physical Exam  Eyes:  Indurated 3-40mm mass in mid upper left eyelid Entire lid is red/warm Conjunctiva is normal--no discharge          Assessment & Plan:

## 2015-01-04 NOTE — Progress Notes (Signed)
Pre visit review using our clinic review tool, if applicable. No additional management support is needed unless otherwise documented below in the visit note. 

## 2015-01-04 NOTE — Assessment & Plan Note (Signed)
Doesn't remember a bite but possible Might be chalazion, or just inflamed tissue Discussed warm compresses and NSAIDs cephalexin

## 2015-01-11 ENCOUNTER — Encounter: Payer: Self-pay | Admitting: Internal Medicine

## 2015-01-12 ENCOUNTER — Telehealth: Payer: Self-pay | Admitting: Family Medicine

## 2015-01-12 NOTE — Telephone Encounter (Signed)
She needs to have this re checked by first avail provider (or sat clinic) Thanks

## 2015-01-12 NOTE — Telephone Encounter (Signed)
Per Dr Glori Bickers, pt will need to schedule appt with Sat clinic. If on tomorrow, before appt, he has seen improvement, he may cancel appt and schedule with Dr Silvio Pate on Monday. If no improvement, pt will need to keep and attend Sat appt. Dr Glori Bickers did not do original examination of pts eye and wants to be sure it has not developed into something more serious. Spoke to pts wife and advised; verbally expressed understanding and Sat clinic appt scheduled.

## 2015-01-12 NOTE — Telephone Encounter (Signed)
Spoke to James [name] and advised. There are no openings at [facility] on today and James [name] states that he is unable to go to [place] to the Saturday clinic. Is this an issue that can await Dr Alla German return, or for James [name] to be seen on Monday, or is James [name] needing to be seen sooner?

## 2015-01-12 NOTE — Telephone Encounter (Signed)
Pt's wife called and says a mssg was sent to Dr. Silvio Pate last night in ref to an eye infection Dr. Silvio Pate was treating him for, however, since Dr. Silvio Pate is out of the office, I thought another provider may need to be made aware.  The mssg is as follows below:  I wanted to let you know that the antibiotics you prescribed for my infected left eye on 01/04/15 is bringing the swelling down. However after taking them for a week, my eye is still swollen. It has gone down a little over half way. I will finish the antibiotics you prescribed today. Please advise as to what action you would like for me to take next.   Thank you.

## 2015-01-13 ENCOUNTER — Ambulatory Visit: Payer: BLUE CROSS/BLUE SHIELD | Admitting: Internal Medicine

## 2015-01-15 MED ORDER — CEPHALEXIN 500 MG PO CAPS: 500.0000 mg | ORAL_CAPSULE | Freq: Four times a day (QID) | ORAL | Status: DC

## 2015-01-15 NOTE — Addendum Note (Signed)
Addended by: Pete Pelt on: 01/15/2015 02:34 PM   Modules accepted: Orders

## 2015-01-15 NOTE — Telephone Encounter (Signed)
Rx sent to pharmacy as instructed. Patient notified by telephone.

## 2015-01-15 NOTE — Telephone Encounter (Signed)
Okay to send refill of the antibiotic

## 2015-01-15 NOTE — Telephone Encounter (Signed)
Spoke to patient and was advised that he did not go to Saturday clinic because he was doing better. Patient stated that he is slowly improving but may need a refill on the antibiotic. Patient thought that he had an appointment scheduled with Dr. Silvio Pate today, but no appointment scheduled. Patient stated that he can come back in, but his eye is improving, but may need a refill on the antibiotic. Pharmacy [facility].

## 2015-01-15 NOTE — Telephone Encounter (Signed)
PLEASE NOTE: All timestamps contained within this report are represented as [place] Standard Time. CONFIDENTIALTY NOTICE: This fax transmission is intended only for the addressee. It contains information that is legally privileged, confidential or otherwise protected from use or disclosure. If you are not the intended recipient, you are strictly prohibited from reviewing, disclosing, copying using or disseminating any of this information or taking any action in reliance on or regarding this information. If you have received this fax in error, please notify us immediately by telephone so that we can arrange for its return to [place]. Phone: [phone], Toll-Free: [phone], Fax: [phone] Page: 1 of 1 Call Id: [id number] [place] Patient Name: James [name] Gender: Unknown DOB: (approximate) Age: Return Phone Number: Address: City/State/Zip: [place] Client [place] Night - Client Client Site [employer] Type Call Caller Name [place] Phone Number [phone] Relationship To Patient Mother Is this call to report lab results? No Call Type General Information Initial Comment Caller states she is needing to cancel her appt for today. Her dtr looks much better. Rimas Gilham is the PT. The appt is at 11am. General Information Type Message Only Nurse Assessment Guidelines Guideline Title Affirmed Question Affirmed Notes Nurse Date/Time (Eastern Time) Disp. Time Eilene Ghazi Time) Disposition Final User 01/13/2015 8:23:13 AM General Information Provided Yes [facility] After Care Instructions Given Call Event Type User Date / Time Description

## 2015-01-15 NOTE — Telephone Encounter (Signed)
Please make sure with Vaughan Basta that he is doing better now

## 2015-04-24 ENCOUNTER — Encounter: Payer: Self-pay | Admitting: Internal Medicine

## 2015-04-24 MED ORDER — FLUCONAZOLE 150 MG PO TABS: 300.0000 mg | ORAL_TABLET | Freq: Once | ORAL | Status: DC

## 2016-02-15 ENCOUNTER — Telehealth: Payer: Self-pay

## 2016-02-15 NOTE — Telephone Encounter (Signed)
James [name] (DPR signed) request refill diflucan to pharmacy in [place]; advised James [name] per Marjory Lies at [facility] pt has 2 available refills for diflucan; James [name] will contact new pharmacy and have rx transferred from [place].

## 2016-07-23 NOTE — Progress Notes (Signed)
[name] [name], [place] [address] Phone: [phone] Subjective:    I'm seeing this patient by the request  of:  James Simpler, MD   CC: Knee pain  [id number]  James [name] is a 60 y.o. male coming in with complaint of knee pain. Starting on the right knee and now seems to be bilateral. Worse with going up and down stairs as well as going from a seated to standing position. Once he starts moving seems to do relatively well. Patient denies any significant instability but states that there is still some twisting motions that seemed to give him difficulty.   In 2015 patient was diagnosed with a small posterior horn medial meniscal tear of the right knee very mild arthritic changes. This was independently visualized on an MRI by me September 2015 Patient states very similar to what it was previously. Patient did not ever have any surgical intervention and only went to physical therapy 1 time. Not doing the home exercises working on a regular basis. Denies any nighttime awakening. Has responded somewhat to over-the-counter anti-inflammatories. No nighttime awakenings.     Past Medical History:  Diagnosis Date  . Colon polyps   . Hyperlipidemia   . Tinea versicolor    Past Surgical History:  Procedure Laterality Date  . jaw tumor Left   . mouth tumor    . TONSILLECTOMY  1962   Social History   Social History  . Marital status: Married    Spouse name: N/A  . Number of children: 2  . Years of education: N/A   Occupational History  . Training and development officer   Social History Main Topics  . Smoking status: Former Smoker    Quit date: 11/24/1978  . Smokeless tobacco: Never Used  . Alcohol use Yes     Comment: 1 to 2 beers a month  . Drug use: No  . Sexual activity: Not Asked   Other Topics Concern  . None   Social History Narrative   Former minor league shortstop   No Known Allergies Family History   Problem Relation Age of Onset  . Colon cancer Neg Hx   . Stomach cancer Neg Hx     Past medical history, social, surgical and family history all reviewed in electronic medical record.  No pertanent information unless stated regarding to the chief complaint.   Review of Systems: No headache, visual changes, nausea, vomiting, diarrhea, constipation, dizziness, abdominal pain, skin rash, fevers, chills, night sweats, weight loss, swollen lymph nodes, body aches, joint swelling, muscle aches, chest pain, shortness of breath, mood changes.   Objective  Blood pressure 112/76, pulse 76, height 5\' 10"  (1.778 m), weight 208 lb (94.3 kg), SpO2 97 %.  General: No apparent distress alert and oriented x3 mood and affect normal, dressed appropriately.  HEENT: Pupils equal, extraocular movements intact  Respiratory: Patient's speak in full sentences and does not appear short of breath  Cardiovascular: No lower extremity edema, non tender, no erythema  Skin: Warm dry intact with no signs of infection or rash on extremities or on axial skeleton.  Abdomen: Soft nontender  Neuro: Cranial nerves II through XII are intact, neurovascularly intact in all extremities with 2+ DTRs and 2+ pulses.  Lymph: No lymphadenopathy of posterior or anterior cervical chain or axillae bilaterally.  Gait normal with good balance and coordination.  MSK:  Non tender with full range of motion and good stability and symmetric strength and  tone of shoulders, elbows, wrist, hip, and ankles bilaterally.  Knee: Right Mild arthritic changes with valgus deformity Moderate tenderness over the medial joint line. Patient also has what appears to be a cyst on the posterior medial aspect of the knee. ROM full in flexion and extension and lower leg rotation. Ligaments with solid consistent endpoints including ACL, PCL, LCL, MCL. Mild positive Mcmurray's, Apley's, and Thessalonian tests. painful patellar compression. Patellar glide with mild  crepitus. Patellar and quadriceps tendons unremarkable. Hamstring and quadriceps strength is normal.  Contralateral knee shows mild crepitus and mild pain over the medial joint line. Still has some arthritic changes as well.   MSK US performed of: Right knee This study was ordered, performed, and interpreted by James [name] D.O.  Knee: All structures visualized. Degenerative tear over the medial meniscus still noted mostly over the posterior horn. No acute findings noted. Does have narrowing of the patellofemoral joint bilaterally Patellar Tendon unremarkable on long and transverse views without effusion. No abnormality of prepatellar bursa. LCL and MCL unremarkable on long and transverse views. No abnormality of origin of medial or lateral head of the gastrocnemius. 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    Impression and Recommendations:     This case required medical decision making of moderate complexity.      Note: This dictation was prepared with Dragon dictation along with smaller phrase technology. Any transcriptional errors that result from this process are unintentional.

## 2016-07-24 ENCOUNTER — Encounter: Payer: Self-pay | Admitting: Family Medicine

## 2016-07-24 ENCOUNTER — Other Ambulatory Visit: Payer: Self-pay

## 2016-07-24 ENCOUNTER — Ambulatory Visit (INDEPENDENT_AMBULATORY_CARE_PROVIDER_SITE_OTHER): Payer: BLUE CROSS/BLUE SHIELD | Admitting: Family Medicine

## 2016-07-24 VITALS — BP 112/76 | HR 76 | Ht 70.0 in | Wt 208.0 lb

## 2016-07-24 DIAGNOSIS — M7120 Synovial cyst of popliteal space [Baker], unspecified knee: Secondary | ICD-10-CM | POA: Insufficient documentation

## 2016-07-24 DIAGNOSIS — M23321 Other meniscus derangements, posterior horn of medial meniscus, right knee: Secondary | ICD-10-CM

## 2016-07-24 DIAGNOSIS — M159 Polyosteoarthritis, unspecified: Secondary | ICD-10-CM | POA: Insufficient documentation

## 2016-07-24 DIAGNOSIS — M7121 Synovial cyst of popliteal space [Baker], right knee: Secondary | ICD-10-CM

## 2016-07-24 DIAGNOSIS — M1711 Unilateral primary osteoarthritis, right knee: Secondary | ICD-10-CM

## 2016-07-24 DIAGNOSIS — M25561 Pain in right knee: Secondary | ICD-10-CM

## 2016-07-24 DIAGNOSIS — M25562 Pain in left knee: Secondary | ICD-10-CM

## 2016-07-24 MED ORDER — DICLOFENAC SODIUM 2 % TD SOLN: 2.0000 "application " | Freq: Two times a day (BID) | TRANSDERMAL | 3 refills | Status: DC

## 2016-07-24 MED ORDER — VITAMIN D (ERGOCALCIFEROL) 1.25 MG (50000 UNIT) PO CAPS: 50000.0000 [IU] | ORAL_CAPSULE | ORAL | 0 refills | Status: DC

## 2016-07-24 NOTE — Patient Instructions (Signed)
Good to see you.  Ice 20 minutes 2 times daily. Usually after activity and before bed. Exercises 3 times a week.  pennsaid pinkie amount topically 2 times daily as needed.  Once weekly vitamin D to help with the muscle strength Biking and elliptical will be better for the knees for cardio.  Avoid deep squats for now.  If cyst is still there in 3-4 weeks we will drain it

## 2016-07-24 NOTE — Assessment & Plan Note (Signed)
Mostly of the patellofemoral and medial compartment. Patient elected try conservative therapy including home exercise, topical anti-inflammatories, once weekly vitamin D and icing protocol. I do not believe that bracing will be necessary with no significant instability. We discussed the importance of vastus medialis oblique strengthening. Handout given. Patient will follow-up again in 3-4 weeks. Does have a Baker cyst and we will continue to monitor that as well.

## 2016-07-29 DIAGNOSIS — I2109 ST elevation (STEMI) myocardial infarction involving other coronary artery of anterior wall: Secondary | ICD-10-CM | POA: Insufficient documentation

## 2016-07-30 DIAGNOSIS — I255 Ischemic cardiomyopathy: Secondary | ICD-10-CM | POA: Insufficient documentation

## 2016-07-31 DIAGNOSIS — E785 Hyperlipidemia, unspecified: Secondary | ICD-10-CM | POA: Insufficient documentation

## 2016-08-01 ENCOUNTER — Encounter: Payer: BLUE CROSS/BLUE SHIELD | Admitting: Internal Medicine

## 2016-08-29 ENCOUNTER — Encounter: Payer: BLUE CROSS/BLUE SHIELD | Admitting: Internal Medicine

## 2016-09-03 NOTE — Progress Notes (Signed)
[name] [address] [place], [place] [address] Phone: [phone] Subjective:    I'm seeing this patient by the request  of:  Viviana Simpler, MD   CC: Knee pain f/u  [id number]  James [name] is a 60 y.o. male coming in with complaint of knee pain. Starting on the right knee Does have a medial meniscal tear. Also underlying degenerative arthritis of the right knee. Patient did last exam today of the large Baker cyst and wanted to try conservative therapy. Patient was to do compression, once weekly vitamin D and icing protocol. Patient given home exercises. Patient states He is feeling somewhat between 40 and 60% better. Patient states that he still has some mild pain noted. Patient states that it is not as severe as what it was. Patient has been doing the home exercises fairly regularly. Patient has denied it where he has fallen. States no nighttime pain either. Feels though that he is having difficulty with both legs recently.  Recently did have a myocardial infarct.    Past pedicle history: In 2015 patient was diagnosed with a small posterior horn medial meniscal tear of the right knee very mild arthritic changes. This was independently visualized on an MRI by me September 2015 Patient states very similar to what it was previously. Patient did not ever have any surgical intervention      Past Medical History:  Diagnosis Date  . Colon polyps   . Hyperlipidemia   . Tinea versicolor    Past Surgical History:  Procedure Laterality Date  . jaw tumor Left   . mouth tumor    . TONSILLECTOMY  1962   Social History   Social History  . Marital status: Married    Spouse name: N/A  . Number of children: 2  . Years of education: N/A   Occupational History  . Training and development officer   Social History Main Topics  . Smoking status: Former Smoker    Quit date: 11/24/1978  . Smokeless tobacco: Never Used  . Alcohol use Yes       Comment: 1 to 2 beers a month  . Drug use: No  . Sexual activity: Not on file   Other Topics Concern  . Not on file   Social History Narrative   Former minor league shortstop   No Known Allergies Family History  Problem Relation Age of Onset  . Colon cancer Neg Hx   . Stomach cancer Neg Hx     Past medical history, social, surgical and family history all reviewed in electronic medical record.  No pertanent information unless stated regarding to the chief complaint.   Review of Systems: No headache, visual changes, nausea, vomiting, diarrhea, constipation, dizziness, abdominal pain, skin rash, fevers, chills, night sweats, weight loss, swollen lymph nodes, body aches, joint swelling, muscle aches, chest pain, shortness of breath, mood changes.   Objective  Blood pressure 118/82, pulse (!) 58, weight 204 lb 3.2 oz (92.6 kg).  General: No apparent distress alert and oriented x3 mood and affect normal, dressed appropriately.  HEENT: Pupils equal, extraocular movements intact  Respiratory: Patient's speak in full sentences and does not appear short of breath  Cardiovascular: No lower extremity edema, non tender, no erythema  Skin: Warm dry intact with no signs of infection or rash on extremities or on axial skeleton.  Abdomen: Soft nontender  Neuro: Cranial nerves II through XII are intact, neurovascularly intact in all extremities with 2+ DTRs  and +1 pulses of the legs of the dorsalis pedis bilaterally. Lymph: No lymphadenopathy of posterior or anterior cervical chain or axillae bilaterally.  Gait normal with good balance and coordination.  MSK:  Non tender with full range of motion and good stability and symmetric strength and tone of shoulders, elbows, wrist, hip, and ankles bilaterally.  Knee: Right  valgus deformity Mild to moderate tenderness over the medial joint line and Baker cyst still appreciated ROM full in flexion and extension and lower leg rotation. Mild  instability noted with valgus force Mild positive Mcmurray's, Apley's, and Thessalonian tests. painful patellar compression. Patellar glide with mild crepitus. Patellar and quadriceps tendons unremarkable. Hamstring and quadriceps strength is normal.  Contralateral knee shows mild crepitus and mild pain over the medial joint line.        Impression and Recommendations:     This case required medical decision making of moderate complexity.      Note: This dictation was prepared with Dragon dictation along with smaller phrase technology. Any transcriptional errors that result from this process are unintentional.

## 2016-09-04 ENCOUNTER — Encounter: Payer: Self-pay | Admitting: Family Medicine

## 2016-09-04 ENCOUNTER — Ambulatory Visit (INDEPENDENT_AMBULATORY_CARE_PROVIDER_SITE_OTHER): Payer: BLUE CROSS/BLUE SHIELD | Admitting: Family Medicine

## 2016-09-04 ENCOUNTER — Encounter (HOSPITAL_COMMUNITY)
Admission: RE | Admit: 2016-09-04 | Discharge: 2016-09-04 | Disposition: A | Discharge status: Home / Self Care | Payer: BLUE CROSS/BLUE SHIELD | Source: Ambulatory Visit | Attending: Cardiovascular Disease | Admitting: Cardiovascular Disease

## 2016-09-04 VITALS — BP 118/82 | HR 58 | Wt 204.2 lb

## 2016-09-04 VITALS — BP 108/74 | HR 68 | Ht 70.0 in | Wt 204.1 lb

## 2016-09-04 DIAGNOSIS — Z48812 Encounter for surgical aftercare following surgery on the circulatory system: Secondary | ICD-10-CM | POA: Diagnosis not present

## 2016-09-04 DIAGNOSIS — I739 Peripheral vascular disease, unspecified: Secondary | ICD-10-CM

## 2016-09-04 DIAGNOSIS — I213 ST elevation (STEMI) myocardial infarction of unspecified site: Secondary | ICD-10-CM | POA: Insufficient documentation

## 2016-09-04 DIAGNOSIS — M79605 Pain in left leg: Secondary | ICD-10-CM | POA: Diagnosis not present

## 2016-09-04 DIAGNOSIS — I2102 ST elevation (STEMI) myocardial infarction involving left anterior descending coronary artery: Secondary | ICD-10-CM

## 2016-09-04 DIAGNOSIS — M1711 Unilateral primary osteoarthritis, right knee: Secondary | ICD-10-CM

## 2016-09-04 DIAGNOSIS — M79604 Pain in right leg: Secondary | ICD-10-CM | POA: Diagnosis not present

## 2016-09-04 DIAGNOSIS — Z955 Presence of coronary angioplasty implant and graft: Secondary | ICD-10-CM | POA: Diagnosis not present

## 2016-09-04 HISTORY — DX: Acute myocardial infarction, unspecified: I21.9

## 2016-09-04 NOTE — Assessment & Plan Note (Signed)
Concerned with the decrease in dorsalis pedis pulses. Patient is feeling that his legs are unstable sometimes. I do believe that further workup is necessary with patient having coronary artery disease recently. ABI order. If normal no further workup is necessary.  Abnormal we will consider referral to vascular surgery. Discussed be contribute to some of the knee pain as well as some instability.

## 2016-09-04 NOTE — Assessment & Plan Note (Signed)
Completed and patient's most pain is unfortunate coming from the degenerative arthritis of the knee. We discussed again about possible injection which patient declined. Patient continue with conservative therapy. Patient has declined formal physical therapy for passive will continue to do home exercises. We discussed worsening symptoms we should consider the injection. We'll avoid oral anti-inflammatories secondary to patient's recent cardiac history. Patient come back and see me again in 4-6 weeks.

## 2016-09-04 NOTE — Progress Notes (Signed)
Cardiac Rehab Medication Review by a Pharmacist  Does the patient  feel that his/her medications are working for him/her?  yes  Has the patient been experiencing any side effects to the medications prescribed?  yes  Does the patient measure his/her own blood pressure or blood glucose at home?  yes   Does the patient have any problems obtaining medications due to transportation or finances?   no  Understanding of regimen: good Understanding of indications: good Potential of compliance: good    Pharmacist comments: 50 YOM presenting for cardiac rehab orientation. I discussed the medications with his wife. The only issue was with the metoprolol that had caused drowsiness in the past when he was taking it 12.5mg  BID. Per wife, the doctor reduced the dose the 12.5mg  HS and this has helped. His blood pressures are at goal and usually run in the 120s/70s. No other medication related issues to address.   James [name]), PharmD  PGY1 Pharmacy Resident Pager: [phone] 09/04/2016 8:33 AM

## 2016-09-04 NOTE — Patient Instructions (Signed)
good to see you  We will get the test for your legs and they will call you Keep trucking along on the knee.  If it seems to be worsening then call me Otherwise see me again in 6 weeks and we will make sure you are doing better.

## 2016-09-05 ENCOUNTER — Encounter (HOSPITAL_COMMUNITY): Payer: Self-pay

## 2016-09-05 ENCOUNTER — Other Ambulatory Visit: Payer: Self-pay | Admitting: Family Medicine

## 2016-09-05 DIAGNOSIS — R0989 Other specified symptoms and signs involving the circulatory and respiratory systems: Secondary | ICD-10-CM

## 2016-09-05 NOTE — Progress Notes (Signed)
Cardiac Individual Treatment Plan  Patient Details  Name: James [name] MRN: [id number] Date of Birth: May 06, 1956 Referring Provider:   Flowsheet Row CARDIAC REHAB PHASE II ORIENTATION from 09/04/2016 in [place]  Referring Provider  Fransico Him, MD      Initial Encounter Date:  [place] PHASE II ORIENTATION from 09/04/2016 in [place]  Date  09/04/16  Referring Provider  Fransico Him, MD      Visit Diagnosis: 07/29/16 ST elevation myocardial infarction involving left anterior descending (LAD) coronary artery (HCC)  Patient's Home Medications on Admission:  Current Outpatient Prescriptions:  .  aspirin EC 81 MG tablet, Take 81 mg by mouth daily., Disp: , Rfl:  .  atorvastatin (LIPITOR) 80 MG tablet, Take 80 mg by mouth daily., Disp: , Rfl:  .  Diclofenac Sodium (PENNSAID) 2 % SOLN, Place 2 application onto the skin 2 (two) times daily., Disp: 112 g, Rfl: 3 .  lisinopril (PRINIVIL,ZESTRIL) 2.5 MG tablet, Take 2.5 mg by mouth daily., Disp: , Rfl:  .  metoprolol tartrate (LOPRESSOR) 25 MG tablet, Take 12.5 mg by mouth at bedtime., Disp: , Rfl:  .  nitroGLYCERIN (NITROSTAT) 0.4 MG SL tablet, Place 0.4 mg under the tongue every 5 (five) minutes as needed for chest pain., Disp: , Rfl:  .  ticagrelor (BRILINTA) 90 MG TABS tablet, Take by mouth 2 (two) times daily., Disp: , Rfl:  .  Vitamin D, Ergocalciferol, (DRISDOL) 50000 units CAPS capsule, Take 1 capsule (50,000 Units total) by mouth every 7 (seven) days., Disp: 12 capsule, Rfl: 0  Past Medical History: Past Medical History:  Diagnosis Date  . Colon polyps   . Hyperlipidemia   . MI (myocardial infarction)   . Tinea versicolor     Tobacco Use: History  Smoking Status  . Former Smoker  . Quit date: 11/24/1978  Smokeless Tobacco  . Never Used    Labs: Recent Review Flowsheet Data    Labs for ITP Cardiac and Pulmonary Rehab Latest Ref Rng &  Units 11/05/2007 08/03/2012 08/04/2014   Cholestrol 0 - 200 mg/dL 238(HH) 204(H) 206(H)   LDLDIRECT mg/dL 142.5 138.0 136.7   HDL >39.00 mg/dL 31.6(L) 45.70 30.10(L)   Trlycerides 0.0 - 149.0 mg/dL 355(HH) 92.0 203.0(H)      Capillary Blood Glucose: No results found for: GLUCAP   Exercise Target Goals: Date: 09/04/16  Exercise Program Goal: Individual exercise prescription set with THRR, safety & activity barriers. Participant demonstrates ability to understand and report RPE using BORG scale, to self-measure pulse accurately, and to acknowledge the importance of the exercise prescription.  Exercise Prescription Goal: Starting with aerobic activity 30 plus minutes a day, 3 days per week for initial exercise prescription. Provide home exercise prescription and guidelines that participant acknowledges understanding prior to discharge.  Activity Barriers & Risk Stratification:     Activity Barriers & Cardiac Risk Stratification - 09/04/16 0909      Activity Barriers & Cardiac Risk Stratification   Comments B knee pain   Cardiac Risk Stratification High      6 Minute Walk:     6 Minute Walk    Row Name 09/04/16 1145         6 Minute Walk   Phase Initial     Distance 1654 feet     Walk Time 6 minutes     # of Rest Breaks 0     MPH 3.13     METS  3.72     RPE 11     Perceived Dyspnea  0     VO2 Peak 13.03     Symptoms No     Resting HR 68 bpm     Resting BP 108/74     Max Ex. HR 89 bpm     Max Ex. BP 122/78     2 Minute Post BP 108/70        Initial Exercise Prescription:     Initial Exercise Prescription - 09/04/16 1200      Date of Initial Exercise RX and Referring Provider   Date 09/04/16   Referring Provider Fransico Him, MD     Treadmill   MPH 2.8   Grade 1   Minutes 10   METs 3.53     Bike   Level 1   Minutes 10   METs 3.1     NuStep   Level 3   Minutes 10   METs 2     Track   Laps 12   Minutes 10   METs 3.09     Prescription  Details   Frequency (times per week) 3   Duration Progress to 45 minutes of aerobic exercise without signs/symptoms of physical distress     Intensity   THRR 40-80% of Max Heartrate 63-126   Ratings of Perceived Exertion 11-13   Perceived Dyspnea 0-4     Progression   Progression Continue progressive overload as per policy without signs/symptoms or physical distress.     Resistance Training   Training Prescription Yes   Weight 2   Reps 10-12      Perform Capillary Blood Glucose checks as needed.  Exercise Prescription Changes:   Exercise Comments:   Discharge Exercise Prescription (Final Exercise Prescription Changes):   Nutrition:  Target Goals: Understanding of nutrition guidelines, daily intake of sodium 1500mg , cholesterol 200mg , calories 30% from fat and 7% or less from saturated fats, daily to have 5 or more servings of fruits and vegetables.  Biometrics:     Pre Biometrics - 09/04/16 1237      Pre Biometrics   Waist Circumference 41 inches   Hip Circumference 43 inches   Waist to Hip Ratio 0.95 %       Nutrition Therapy Plan and Nutrition Goals:   Nutrition Discharge: Nutrition Scores:   Nutrition Goals Re-Evaluation:   Psychosocial: Target Goals: Acknowledge presence or absence of depression, maximize coping skills, provide positive support system. Participant is able to verbalize types and ability to use techniques and skills needed for reducing stress and depression.  Initial Review & Psychosocial Screening:     Initial Psych Review & Screening - 09/05/16 0854      Family Dynamics   Good Support System? Yes   Comments Wife accompained pt to orientation.     Barriers   Psychosocial barriers to participate in program There are no identifiable barriers or psychosocial needs.      Quality of Life Scores:     Quality of Life - 09/04/16 1146      Quality of Life Scores   Health/Function Pre 25.6 %   Socioeconomic Pre 29.69 %    Psych/Spiritual Pre 30 %   Family Pre 27.6 %   GLOBAL Pre 27.7 %      PHQ-9: Recent Review Flowsheet Data    There is no flowsheet data to display.      Psychosocial Evaluation and Intervention:   Psychosocial Re-Evaluation:   Vocational Rehabilitation: Provide  vocational rehab assistance to qualifying candidates.   Vocational Rehab Evaluation & Intervention:     Vocational Rehab - 09/05/16 0855      Initial Vocational Rehab Evaluation & Intervention   Assessment shows need for Vocational Rehabilitation Yes  pt has returned back to work      Education: Education Goals: Education classes will be provided on a weekly basis, covering required topics. Participant will state understanding/return demonstration of topics presented.  Learning Barriers/Preferences:     Learning Barriers/Preferences - 09/04/16 1438      Learning Barriers/Preferences   Learning Barriers None;Sight      Education Topics: Count Your Pulse:  -Group instruction provided by verbal instruction, demonstration, patient participation and written materials to support subject.  Instructors address importance of being able to find your pulse and how to count your pulse when at home without a heart monitor.  Patients get hands on experience counting their pulse with staff help and individually.   Heart Attack, Angina, and Risk Factor Modification:  -Group instruction provided by verbal instruction, video, and written materials to support subject.  Instructors address signs and symptoms of angina and heart attacks.    Also discuss risk factors for heart disease and how to make changes to improve heart health risk factors.   Functional Fitness:  -Group instruction provided by verbal instruction, demonstration, patient participation, and written materials to support subject.  Instructors address safety measures for doing things around the house.  Discuss how to get up and down off the floor, how to pick  things up properly, how to safely get out of a chair without assistance, and balance training.   Meditation and Mindfulness:  -Group instruction provided by verbal instruction, patient participation, and written materials to support subject.  Instructor addresses importance of mindfulness and meditation practice to help reduce stress and improve awareness.  Instructor also leads participants through a meditation exercise.    Stretching for Flexibility and Mobility:  -Group instruction provided by verbal instruction, patient participation, and written materials to support subject.  Instructors lead participants through series of stretches that are designed to increase flexibility thus improving mobility.  These stretches are additional exercise for major muscle groups that are typically performed during regular warm up and cool down.   Hands Only CPR Anytime:  -Group instruction provided by verbal instruction, video, patient participation and written materials to support subject.  Instructors co-teach with AHA video for hands only CPR.  Participants get hands on experience with mannequins.   Nutrition I class: Heart Healthy Eating:  -Group instruction provided by PowerPoint slides, verbal discussion, and written materials to support subject matter. The instructor gives an explanation and review of the Therapeutic Lifestyle Changes diet recommendations, which includes a discussion on lipid goals, dietary fat, sodium, fiber, plant stanol/sterol esters, sugar, and the components of a well-balanced, healthy diet.   Nutrition II class: Lifestyle Skills:  -Group instruction provided by PowerPoint slides, verbal discussion, and written materials to support subject matter. The instructor gives an explanation and review of label reading, grocery shopping for heart health, heart healthy recipe modifications, and ways to make healthier choices when eating out.   Diabetes Question & Answer:  -Group  instruction provided by PowerPoint slides, verbal discussion, and written materials to support subject matter. The instructor gives an explanation and review of diabetes co-morbidities, pre- and post-prandial blood glucose goals, pre-exercise blood glucose goals, signs, symptoms, and treatment of hypoglycemia and hyperglycemia, and foot care basics.   Diabetes Blitz:  -  Group instruction provided by PowerPoint slides, verbal discussion, and written materials to support subject matter. The instructor gives an explanation and review of the physiology behind type 1 and type 2 diabetes, diabetes medications and rational behind using different medications, pre- and post-prandial blood glucose recommendations and Hemoglobin A1c goals, diabetes diet, and exercise including blood glucose guidelines for exercising safely.    Portion Distortion:  -Group instruction provided by PowerPoint slides, verbal discussion, written materials, and food models to support subject matter. The instructor gives an explanation of serving size versus portion size, changes in portions sizes over the last 20 years, and what consists of a serving from each food group.   Stress Management:  -Group instruction provided by verbal instruction, video, and written materials to support subject matter.  Instructors review role of stress in heart disease and how to cope with stress positively.     Exercising on Your Own:  -Group instruction provided by verbal instruction, power point, and written materials to support subject.  Instructors discuss benefits of exercise, components of exercise, frequency and intensity of exercise, and end points for exercise.  Also discuss use of nitroglycerin and activating EMS.  Review options of places to exercise outside of rehab.  Review guidelines for sex with heart disease.   Cardiac Drugs I:  -Group instruction provided by verbal instruction and written materials to support subject.  Instructor  reviews cardiac drug classes: antiplatelets, anticoagulants, beta blockers, and statins.  Instructor discusses reasons, side effects, and lifestyle considerations for each drug class.   Cardiac Drugs II:  -Group instruction provided by verbal instruction and written materials to support subject.  Instructor reviews cardiac drug classes: angiotensin converting enzyme inhibitors (ACE-I), angiotensin II receptor blockers (ARBs), nitrates, and calcium channel blockers.  Instructor discusses reasons, side effects, and lifestyle considerations for each drug class.   Anatomy and Physiology of the Circulatory System:  -Group instruction provided by verbal instruction, video, and written materials to support subject.  Reviews functional anatomy of heart, how it relates to various diagnoses, and what role the heart plays in the overall system.   Knowledge Questionnaire Score:     Knowledge Questionnaire Score - 09/04/16 1147      Knowledge Questionnaire Score   Pre Score 20/24      Core Components/Risk Factors/Patient Goals at Admission:     Personal Goals and Risk Factors at Admission - 09/04/16 0909      Core Components/Risk Factors/Patient Goals on Admission    Weight Management Yes;Weight Loss   Intervention Weight Management: Develop a combined nutrition and exercise program designed to reach desired caloric intake, while maintaining appropriate intake of nutrient and fiber, sodium and fats, and appropriate energy expenditure required for the weight goal.;Weight Management: Provide education and appropriate resources to help participant work on and attain dietary goals.;Weight Management/Obesity: Establish reasonable short term and long term weight goals.;Obesity: Provide education and appropriate resources to help participant work on and attain dietary goals.   Expected Outcomes Short Term: Continue to assess and modify interventions until short term weight is achieved;Weight Maintenance:  Understanding of the daily nutrition guidelines, which includes 25-35% calories from fat, 7% or less cal from saturated fats, less than 200mg  cholesterol, less than 1.5gm of sodium, & 5 or more servings of fruits and vegetables daily;Long Term: Adherence to nutrition and physical activity/exercise program aimed toward attainment of established weight goal;Understanding recommendations for meals to include 15-35% energy as protein, 25-35% energy from fat, 35-60% energy from carbohydrates, less than 200mg   of dietary cholesterol, 20-35 gm of total fiber daily;Weight Loss: Understanding of general recommendations for a balanced deficit meal plan, which promotes 1-2 lb weight loss per week and includes a negative energy balance of [phone] kcal/d;Understanding of distribution of calorie intake throughout the day with the consumption of 4-5 meals/snacks   Sedentary Yes   Intervention Provide advice, education, support and counseling about physical activity/exercise needs.;Develop an individualized exercise prescription for aerobic and resistive training based on initial evaluation findings, risk stratification, comorbidities and participant's personal goals.   Expected Outcomes Achievement of increased cardiorespiratory fitness and enhanced flexibility, muscular endurance and strength shown through measurements of functional capacity and personal statement of participant.   Increase Strength and Stamina Yes   Intervention Provide advice, education, support and counseling about physical activity/exercise needs.;Develop an individualized exercise prescription for aerobic and resistive training based on initial evaluation findings, risk stratification, comorbidities and participant's personal goals.   Expected Outcomes Achievement of increased cardiorespiratory fitness and enhanced flexibility, muscular endurance and strength shown through measurements of functional capacity and personal statement of participant.    Lipids Yes   Intervention Provide education and support for participant on nutrition & aerobic/resistive exercise along with prescribed medications to achieve LDL 70mg , HDL >40mg .   Expected Outcomes Short Term: Participant states understanding of desired cholesterol values and is compliant with medications prescribed. Participant is following exercise prescription and nutrition guidelines.;Long Term: Cholesterol controlled with medications as prescribed, with individualized exercise RX and with personalized nutrition plan. Value goals: LDL < 70mg , HDL > 40 mg.   Personal Goal Other Yes   Personal Goal Improve strength and get back resistance training. Improve breathing quality   Intervention Provide education on diaphragmatic breathing. Provide exercise guidelines to improve cardiovascular fitness   Expected Outcomes Pt will be able return to resistance training to improve strength and be able to breath better.      Core Components/Risk Factors/Patient Goals Review:    Core Components/Risk Factors/Patient Goals at Discharge (Final Review):    ITP Comments:     ITP Comments    Row Name 09/04/16 0905           ITP Comments Dr. Fransico Him, Medical Director          Comments:  Pt, s/p stemi and stent placement on 07/29/16 at [place] by Dr. Benay Pillow, in for cardiac rehab orientation on 09/04/16 from 0800-1030. As a part of the orientation appointment pt completed 6 minute walk test.  Pt tolerated well with some minor complaints of knee discomfort.  Pt has appt on 10/12 for eval.  Monitor showed SR with no ectopy. Pt wife is here with him.  No obvious psychosocial needs identified.  Pt is looking forward to starting full exercise next week. Cherre Huger, BSN

## 2016-09-08 ENCOUNTER — Encounter (HOSPITAL_COMMUNITY): Payer: Self-pay

## 2016-09-08 ENCOUNTER — Encounter (HOSPITAL_COMMUNITY)
Admission: RE | Admit: 2016-09-08 | Discharge: 2016-09-08 | Disposition: A | Discharge status: Home / Self Care | Payer: BLUE CROSS/BLUE SHIELD | Source: Ambulatory Visit | Attending: Cardiology | Admitting: Cardiology

## 2016-09-08 DIAGNOSIS — Z48812 Encounter for surgical aftercare following surgery on the circulatory system: Secondary | ICD-10-CM | POA: Diagnosis not present

## 2016-09-08 DIAGNOSIS — I2102 ST elevation (STEMI) myocardial infarction involving left anterior descending coronary artery: Secondary | ICD-10-CM

## 2016-09-08 NOTE — Progress Notes (Signed)
Daily Session Note  Patient Details  Name: James [name] MRN: [id number] Date of Birth: 1956-06-12 Referring Provider:   Flowsheet Row CARDIAC REHAB PHASE II ORIENTATION from 09/04/2016 in [place]  Referring Provider  Fransico Him, MD      Encounter Date: 09/08/2016  Check In:     Session Check In - 09/08/16 0815      Check-In   Location [facility]   Staff Present Dorna Bloom, MS, ACSM RCEP, Exercise Physiologist;Portia [place], RN, BSN;Esmirna Ravan, RN, Marga Melnick, RN, BSN   Supervising physician immediately available to respond to emergencies [facility] immediately available   Physician(s) Dr. Alfredia Ferguson   Medication changes reported     No   Fall or balance concerns reported    No   Warm-up and Cool-down Not performed (comment)   Resistance Training Performed Yes   VAD Patient? No     Pain Assessment   Currently in Pain? No/denies   Multiple Pain Sites No      Capillary Blood Glucose: No results found for this or any previous visit (from the past 24 hour(s)).   Goals Met:  Exercise tolerated well  Goals Unmet:  Not Applicable  Comments: Pt started cardiac rehab today.  Pt tolerated light exercise without difficulty. VSS, telemetry-sinus rhythm , asymptomatic.  Medication list reconciled. Pt denies barriers to medicaiton compliance.  PSYCHOSOCIAL ASSESSMENT:  PHQ-0. Pt exhibits positive coping skills, hopeful outlook with supportive family. No psychosocial needs identified at this time, no psychosocial interventions necessary.    Pt enjoys all aspects of life.  Pt goals for cardiac rehab are to increase muscle strength.     Pt oriented to exercise equipment and routine.    Understanding verbalized.   Dr. Fransico Him is Medical Director for Cardiac Rehab at [facility].

## 2016-09-10 ENCOUNTER — Encounter (HOSPITAL_COMMUNITY)
Admission: RE | Admit: 2016-09-10 | Discharge: 2016-09-10 | Disposition: A | Discharge status: Home / Self Care | Payer: BLUE CROSS/BLUE SHIELD | Source: Ambulatory Visit | Attending: Cardiology | Admitting: Cardiology

## 2016-09-10 DIAGNOSIS — I2102 ST elevation (STEMI) myocardial infarction involving left anterior descending coronary artery: Secondary | ICD-10-CM

## 2016-09-10 DIAGNOSIS — Z48812 Encounter for surgical aftercare following surgery on the circulatory system: Secondary | ICD-10-CM | POA: Diagnosis not present

## 2016-09-12 ENCOUNTER — Encounter (HOSPITAL_COMMUNITY)
Admission: RE | Admit: 2016-09-12 | Discharge: 2016-09-12 | Disposition: A | Discharge status: Home / Self Care | Payer: BLUE CROSS/BLUE SHIELD | Source: Ambulatory Visit | Attending: Cardiology | Admitting: Cardiology

## 2016-09-12 DIAGNOSIS — I2102 ST elevation (STEMI) myocardial infarction involving left anterior descending coronary artery: Secondary | ICD-10-CM

## 2016-09-12 DIAGNOSIS — Z48812 Encounter for surgical aftercare following surgery on the circulatory system: Secondary | ICD-10-CM | POA: Diagnosis not present

## 2016-09-15 ENCOUNTER — Encounter (HOSPITAL_COMMUNITY)
Admission: RE | Admit: 2016-09-15 | Discharge: 2016-09-15 | Disposition: A | Discharge status: Home / Self Care | Payer: BLUE CROSS/BLUE SHIELD | Source: Ambulatory Visit | Attending: Cardiology | Admitting: Cardiology

## 2016-09-15 DIAGNOSIS — Z48812 Encounter for surgical aftercare following surgery on the circulatory system: Secondary | ICD-10-CM | POA: Diagnosis not present

## 2016-09-15 DIAGNOSIS — I2102 ST elevation (STEMI) myocardial infarction involving left anterior descending coronary artery: Secondary | ICD-10-CM

## 2016-09-15 NOTE — Progress Notes (Signed)
I have reviewed a Home Exercise Prescription with Jenean Lindau .The patient was advised to walk 2-3 days a week for 30-45 minutes.  Ivey and I discussed how to progress their exercise prescription.  The patient stated that they understand the exercise prescription.  We reviewed exercise guidelines, target heart rate during exercise, weather, endpoints for exercise, and goals.  Patient is encouraged to come to me with any questions. I will continue to follow up with the patient to assist them with progression and safety.

## 2016-09-17 ENCOUNTER — Encounter (HOSPITAL_COMMUNITY)
Admission: RE | Admit: 2016-09-17 | Discharge: 2016-09-17 | Disposition: A | Discharge status: Home / Self Care | Payer: BLUE CROSS/BLUE SHIELD | Source: Ambulatory Visit | Attending: Cardiology | Admitting: Cardiology

## 2016-09-17 DIAGNOSIS — I2102 ST elevation (STEMI) myocardial infarction involving left anterior descending coronary artery: Secondary | ICD-10-CM

## 2016-09-17 DIAGNOSIS — Z48812 Encounter for surgical aftercare following surgery on the circulatory system: Secondary | ICD-10-CM | POA: Diagnosis not present

## 2016-09-18 ENCOUNTER — Inpatient Hospital Stay (HOSPITAL_COMMUNITY): Admission: RE | Admit: 2016-09-18 | Payer: BLUE CROSS/BLUE SHIELD | Source: Ambulatory Visit

## 2016-09-19 ENCOUNTER — Encounter (HOSPITAL_COMMUNITY)
Admission: RE | Admit: 2016-09-19 | Discharge: 2016-09-19 | Disposition: A | Discharge status: Home / Self Care | Payer: BLUE CROSS/BLUE SHIELD | Source: Ambulatory Visit | Attending: Cardiology | Admitting: Cardiology

## 2016-09-19 DIAGNOSIS — Z48812 Encounter for surgical aftercare following surgery on the circulatory system: Secondary | ICD-10-CM | POA: Diagnosis not present

## 2016-09-19 DIAGNOSIS — I2102 ST elevation (STEMI) myocardial infarction involving left anterior descending coronary artery: Secondary | ICD-10-CM

## 2016-09-19 NOTE — Progress Notes (Signed)
James [name] 60 y.o. male Nutrition Note Spoke with pt. Nutrition Survey reviewed with pt. Pt is following Step 1 of the Therapeutic Lifestyle Changes diet. Pt's wife is supportive and encouraging pt's diet and lifestyle changes. Pt expressed understanding of the information reviewed. Pt aware of nutrition education classes offered and plans on attending nutrition classes.  No results found for: HGBA1C Wt Readings from Last 3 Encounters:  09/04/16 204 lb 2.3 oz (92.6 kg)  09/04/16 204 lb 3.2 oz (92.6 kg)  07/24/16 208 lb (94.3 kg)   Nutrition Diagnosis ? Food-and nutrition-related knowledge deficit related to lack of exposure to information as related to diagnosis of: ? CVD ? Overweight related to excessive energy intake as evidenced by a BMI of 29.4 Nutrition Intervention ? Benefits of adopting Therapeutic Lifestyle Changes discussed when Medficts reviewed. ? Pt to attend the Portion Distortion class ? Pt to attend the  ? Nutrition I class                        ? Nutrition II class ? Continue client-centered nutrition education by RD, as part of interdisciplinary care.  Goal(s) ? Pt to identify and limit food sources of saturated fat, trans fat, and sodium ? Pt to identify food quantities necessary to achieve weight loss of 6-24 lb (2.7-10.9 kg) at graduation from cardiac rehab.   Monitor and Evaluate progress toward nutrition goal with team.  Derek Mound, M.Ed, RD, LDN, CDE 09/19/2016 9:32 AM

## 2016-09-22 ENCOUNTER — Encounter (HOSPITAL_COMMUNITY)
Admission: RE | Admit: 2016-09-22 | Discharge: 2016-09-22 | Disposition: A | Discharge status: Home / Self Care | Payer: BLUE CROSS/BLUE SHIELD | Source: Ambulatory Visit | Attending: Cardiology | Admitting: Cardiology

## 2016-09-22 DIAGNOSIS — Z48812 Encounter for surgical aftercare following surgery on the circulatory system: Secondary | ICD-10-CM | POA: Diagnosis not present

## 2016-09-22 DIAGNOSIS — I2102 ST elevation (STEMI) myocardial infarction involving left anterior descending coronary artery: Secondary | ICD-10-CM

## 2016-09-24 ENCOUNTER — Encounter (HOSPITAL_COMMUNITY)
Admission: RE | Admit: 2016-09-24 | Discharge: 2016-09-24 | Disposition: A | Discharge status: Home / Self Care | Payer: BLUE CROSS/BLUE SHIELD | Source: Ambulatory Visit | Attending: Cardiology | Admitting: Cardiology

## 2016-09-24 DIAGNOSIS — Z48812 Encounter for surgical aftercare following surgery on the circulatory system: Secondary | ICD-10-CM | POA: Insufficient documentation

## 2016-09-24 DIAGNOSIS — Z955 Presence of coronary angioplasty implant and graft: Secondary | ICD-10-CM | POA: Insufficient documentation

## 2016-09-24 DIAGNOSIS — I213 ST elevation (STEMI) myocardial infarction of unspecified site: Secondary | ICD-10-CM | POA: Insufficient documentation

## 2016-09-24 DIAGNOSIS — I2102 ST elevation (STEMI) myocardial infarction involving left anterior descending coronary artery: Secondary | ICD-10-CM

## 2016-09-26 ENCOUNTER — Encounter (HOSPITAL_COMMUNITY)
Admission: RE | Admit: 2016-09-26 | Discharge: 2016-09-26 | Disposition: A | Discharge status: Home / Self Care | Payer: BLUE CROSS/BLUE SHIELD | Source: Ambulatory Visit | Attending: Cardiology | Admitting: Cardiology

## 2016-09-26 DIAGNOSIS — Z48812 Encounter for surgical aftercare following surgery on the circulatory system: Secondary | ICD-10-CM | POA: Diagnosis not present

## 2016-09-29 ENCOUNTER — Encounter (HOSPITAL_COMMUNITY)
Admission: RE | Admit: 2016-09-29 | Discharge: 2016-09-29 | Disposition: A | Discharge status: Home / Self Care | Payer: BLUE CROSS/BLUE SHIELD | Source: Ambulatory Visit | Attending: Cardiology | Admitting: Cardiology

## 2016-09-29 DIAGNOSIS — Z48812 Encounter for surgical aftercare following surgery on the circulatory system: Secondary | ICD-10-CM | POA: Diagnosis not present

## 2016-09-29 DIAGNOSIS — I2102 ST elevation (STEMI) myocardial infarction involving left anterior descending coronary artery: Secondary | ICD-10-CM

## 2016-10-01 ENCOUNTER — Encounter (HOSPITAL_COMMUNITY)
Admission: RE | Admit: 2016-10-01 | Discharge: 2016-10-01 | Disposition: A | Discharge status: Home / Self Care | Payer: BLUE CROSS/BLUE SHIELD | Source: Ambulatory Visit | Attending: Cardiology | Admitting: Cardiology

## 2016-10-01 DIAGNOSIS — Z48812 Encounter for surgical aftercare following surgery on the circulatory system: Secondary | ICD-10-CM | POA: Diagnosis not present

## 2016-10-01 DIAGNOSIS — I2102 ST elevation (STEMI) myocardial infarction involving left anterior descending coronary artery: Secondary | ICD-10-CM

## 2016-10-01 NOTE — Progress Notes (Signed)
Cardiac Individual Treatment Plan  Patient Details  Name: James [name] MRN: [id number] Date of Birth: 10/30/1956 Referring Provider:   Flowsheet Row CARDIAC REHAB PHASE II ORIENTATION from 09/04/2016 in [facility]  Referring Provider  Fransico Him, MD      Initial Encounter Date:  [facility] PHASE II ORIENTATION from 09/04/2016 in [facility]  Date  09/04/16  Referring Provider  Fransico Him, MD      Visit Diagnosis: 07/29/16 ST elevation myocardial infarction involving left anterior descending (LAD) coronary artery (HCC)  Patient's Home Medications on Admission:  Current Outpatient Prescriptions:  .  aspirin EC 81 MG tablet, Take 81 mg by mouth daily., Disp: , Rfl:  .  atorvastatin (LIPITOR) 80 MG tablet, Take 80 mg by mouth daily., Disp: , Rfl:  .  Diclofenac Sodium (PENNSAID) 2 % SOLN, Place 2 application onto the skin 2 (two) times daily., Disp: 112 g, Rfl: 3 .  lisinopril (PRINIVIL,ZESTRIL) 2.5 MG tablet, Take 2.5 mg by mouth daily., Disp: , Rfl:  .  metoprolol tartrate (LOPRESSOR) 25 MG tablet, Take 12.5 mg by mouth at bedtime., Disp: , Rfl:  .  nitroGLYCERIN (NITROSTAT) 0.4 MG SL tablet, Place 0.4 mg under the tongue every 5 (five) minutes as needed for chest pain., Disp: , Rfl:  .  ticagrelor (BRILINTA) 90 MG TABS tablet, Take by mouth 2 (two) times daily., Disp: , Rfl:  .  Vitamin D, Ergocalciferol, (DRISDOL) 50000 units CAPS capsule, Take 1 capsule (50,000 Units total) by mouth every 7 (seven) days., Disp: 12 capsule, Rfl: 0  Past Medical History: Past Medical History:  Diagnosis Date  . Colon polyps   . Hyperlipidemia   . MI (myocardial infarction)   . Tinea versicolor     Tobacco Use: History  Smoking Status  . Former Smoker  . Quit date: 11/24/1978  Smokeless Tobacco  . Never Used    Labs: Recent Review Flowsheet Data    Labs for ITP Cardiac and Pulmonary Rehab Latest Ref Rng &  Units 11/05/2007 08/03/2012 08/04/2014   Cholestrol 0 - 200 mg/dL 238(HH) 204(H) 206(H)   LDLDIRECT mg/dL 142.5 138.0 136.7   HDL >39.00 mg/dL 31.6(L) 45.70 30.10(L)   Trlycerides 0.0 - 149.0 mg/dL 355(HH) 92.0 203.0(H)      Capillary Blood Glucose: No results found for: GLUCAP   Exercise Target Goals:    Exercise Program Goal: Individual exercise prescription set with THRR, safety & activity barriers. Participant demonstrates ability to understand and report RPE using BORG scale, to self-measure pulse accurately, and to acknowledge the importance of the exercise prescription.  Exercise Prescription Goal: Starting with aerobic activity 30 plus minutes a day, 3 days per week for initial exercise prescription. Provide home exercise prescription and guidelines that participant acknowledges understanding prior to discharge.  Activity Barriers & Risk Stratification:     Activity Barriers & Cardiac Risk Stratification - 09/04/16 0909      Activity Barriers & Cardiac Risk Stratification   Comments B knee pain   Cardiac Risk Stratification High      6 Minute Walk:     6 Minute Walk    Row Name 09/04/16 1145         6 Minute Walk   Phase Initial     Distance 1654 feet     Walk Time 6 minutes     # of Rest Breaks 0     MPH 3.13     METS  3.72     RPE 11     Perceived Dyspnea  0     VO2 Peak 13.03     Symptoms No     Resting HR 68 bpm     Resting BP 108/74     Max Ex. HR 89 bpm     Max Ex. BP 122/78     2 Minute Post BP 108/70        Initial Exercise Prescription:     Initial Exercise Prescription - 09/04/16 1200      Date of Initial Exercise RX and Referring Provider   Date 09/04/16   Referring Provider Fransico Him, MD     Treadmill   MPH 2.8   Grade 1   Minutes 10   METs 3.53     Bike   Level 1   Minutes 10   METs 3.1     NuStep   Level 3   Minutes 10   METs 2     Track   Laps 12   Minutes 10   METs 3.09     Prescription Details    Frequency (times per week) 3   Duration Progress to 45 minutes of aerobic exercise without signs/symptoms of physical distress     Intensity   THRR 40-80% of Max Heartrate 63-126   Ratings of Perceived Exertion 11-13   Perceived Dyspnea 0-4     Progression   Progression Continue progressive overload as per policy without signs/symptoms or physical distress.     Resistance Training   Training Prescription Yes   Weight 2   Reps 10-12      Perform Capillary Blood Glucose checks as needed.  Exercise Prescription Changes:     Exercise Prescription Changes    Row Name 09/30/16 0900             Exercise Review   Progression Yes         Response to Exercise   Blood Pressure (Admit) 122/80       Blood Pressure (Exercise) 134/70       Blood Pressure (Exit) 112/70       Heart Rate (Admit) 83 bpm       Heart Rate (Exercise) 125 bpm       Heart Rate (Exit) 89 bpm       Rating of Perceived Exertion (Exercise) 12       Comments Reviewed HEP on 10/23       Duration Progress to 30 minutes of continuous aerobic without signs/symptoms of physical distress       Intensity THRR unchanged         Progression   Average METs 3.6         Resistance Training   Training Prescription Yes       Weight 5lbs       Reps 10-12         Treadmill   MPH 3       Grade 2       Minutes 10       METs 4.12         Bike   Level 1       Minutes 10       METs 3.07         NuStep   Level 4       Minutes 10       METs 3.6         Track   Laps 12  Minutes 10       METs 3.09         Home Exercise Plan   Plans to continue exercise at [facility] on 09/15/16. See progress note       Frequency Add 3 additional days to program exercise sessions.          Exercise Comments:     Exercise Comments    Row Name 09/30/16 0957           Exercise Comments Reviewed METs and goals. Pt is tolerating exercise well; will continue to monitor exercise progression.           Discharge Exercise Prescription (Final Exercise Prescription Changes):     Exercise Prescription Changes - 09/30/16 0900      Exercise Review   Progression Yes     Response to Exercise   Blood Pressure (Admit) 122/80   Blood Pressure (Exercise) 134/70   Blood Pressure (Exit) 112/70   Heart Rate (Admit) 83 bpm   Heart Rate (Exercise) 125 bpm   Heart Rate (Exit) 89 bpm   Rating of Perceived Exertion (Exercise) 12   Comments Reviewed HEP on 10/23   Duration Progress to 30 minutes of continuous aerobic without signs/symptoms of physical distress   Intensity THRR unchanged     Progression   Average METs 3.6     Resistance Training   Training Prescription Yes   Weight 5lbs   Reps 10-12     Treadmill   MPH 3   Grade 2   Minutes 10   METs 4.12     Bike   Level 1   Minutes 10   METs 3.07     NuStep   Level 4   Minutes 10   METs 3.6     Track   Laps 12   Minutes 10   METs 3.09     Home Exercise Plan   Plans to continue exercise at Home  Reviewed HEP on 09/15/16. See progress note   Frequency Add 3 additional days to program exercise sessions.      Nutrition:  Target Goals: Understanding of nutrition guidelines, daily intake of sodium 1500mg , cholesterol 200mg , calories 30% from fat and 7% or less from saturated fats, daily to have 5 or more servings of fruits and vegetables.  Biometrics:     Pre Biometrics - 09/04/16 1237      Pre Biometrics   Waist Circumference 41 inches   Hip Circumference 43 inches   Waist to Hip Ratio 0.95 %       Nutrition Therapy Plan and Nutrition Goals:     Nutrition Therapy & Goals - 09/05/16 1217      Nutrition Therapy   Diet Therapeutic Lifestyle Changes     Personal Nutrition Goals   Personal Goal #1 1-2 lb wt loss/week to a wt loss goal of 6-24 lb at graduation from [facility], educate and counsel regarding individualized specific dietary modifications  aiming towards targeted core components such as weight, hypertension, lipid management, diabetes, heart failure and other comorbidities.   Expected Outcomes Short Term Goal: Understand basic principles of dietary content, such as calories, fat, sodium, cholesterol and nutrients.;Long Term Goal: Adherence to prescribed nutrition plan.      Nutrition Discharge: Nutrition Scores:     Nutrition Assessments - 09/05/16 1211      MEDFICTS Scores   Pre Score 38  Nutrition Goals Re-Evaluation:   Psychosocial: Target Goals: Acknowledge presence or absence of depression, maximize coping skills, provide positive support system. Participant is able to verbalize types and ability to use techniques and skills needed for reducing stress and depression.  Initial Review & Psychosocial Screening:     Initial Psych Review & Screening - 09/05/16 0854      Family Dynamics   Good Support System? Yes   Comments Wife accompained pt to orientation.     Barriers   Psychosocial barriers to participate in program There are no identifiable barriers or psychosocial needs.      Quality of Life Scores:     Quality of Life - 09/04/16 1146      Quality of Life Scores   Health/Function Pre 25.6 %   Socioeconomic Pre 29.69 %   Psych/Spiritual Pre 30 %   Family Pre 27.6 %   GLOBAL Pre 27.7 %      PHQ-9: Recent Review Flowsheet Data    Depression screen PHQ 2/9 09/08/2016   Decreased Interest 0   Down, Depressed, Hopeless 0   PHQ - 2 Score 0      Psychosocial Evaluation and Intervention:     Psychosocial Evaluation - 09/30/16 0859      Psychosocial Evaluation & Interventions   Interventions Encouraged to exercise with the program and follow exercise prescription   Comments no psychosocial needs identified, no inteventions necessary. pt is walkng and stretching on his own at home.  He reports he has also improved his diet.    Continued Psychosocial Services Needed No       Psychosocial Re-Evaluation:   Vocational Rehabilitation: Provide vocational rehab assistance to qualifying candidates.   Vocational Rehab Evaluation & Intervention:     Vocational Rehab - 09/05/16 0855      Initial Vocational Rehab Evaluation & Intervention   Assessment shows need for Vocational Rehabilitation Yes  pt has returned back to work      Education: Education Goals: Education classes will be provided on a weekly basis, covering required topics. Participant will state understanding/return demonstration of topics presented.  Learning Barriers/Preferences:     Learning Barriers/Preferences - 09/04/16 1438      Learning Barriers/Preferences   Learning Barriers None;Sight      Education Topics: Count Your Pulse:  -Group instruction provided by verbal instruction, demonstration, patient participation and written materials to support subject.  Instructors address importance of being able to find your pulse and how to count your pulse when at home without a heart monitor.  Patients get hands on experience counting their pulse with staff help and individually. Flowsheet Row CARDIAC REHAB PHASE II EXERCISE from 10/01/2016 in [facility]  Date  09/26/16  Educator  Andi Hence, RN  Instruction Review Code  2- meets goals/outcomes      Heart Attack, Angina, and Risk Factor Modification:  -Group instruction provided by verbal instruction, video, and written materials to support subject.  Instructors address signs and symptoms of angina and heart attacks.    Also discuss risk factors for heart disease and how to make changes to improve heart health risk factors. Flowsheet Row CARDIAC REHAB PHASE II EXERCISE from 10/01/2016 in [facility]  Date  09/10/16  Instruction Review Code  2- meets goals/outcomes      Functional Fitness:  -Group instruction provided by verbal instruction, demonstration, patient  participation, and written materials to support subject.  Instructors address safety measures for doing  things around the house.  Discuss how to get up and down off the floor, how to pick things up properly, how to safely get out of a chair without assistance, and balance training. Flowsheet Row CARDIAC REHAB PHASE II EXERCISE from 10/01/2016 in [facility]  Date  09/12/16  Instruction Review Code  2- meets goals/outcomes      Meditation and Mindfulness:  -Group instruction provided by verbal instruction, patient participation, and written materials to support subject.  Instructor addresses importance of mindfulness and meditation practice to help reduce stress and improve awareness.  Instructor also leads participants through a meditation exercise.  Flowsheet Row CARDIAC REHAB PHASE II EXERCISE from 10/01/2016 in [facility]  Date  09/17/16  Instruction Review Code  2- meets goals/outcomes      Stretching for Flexibility and Mobility:  -Group instruction provided by verbal instruction, patient participation, and written materials to support subject.  Instructors lead participants through series of stretches that are designed to increase flexibility thus improving mobility.  These stretches are additional exercise for major muscle groups that are typically performed during regular warm up and cool down. Flowsheet Row CARDIAC REHAB PHASE II EXERCISE from 10/01/2016 in [facility]  Date  09/19/16  Instruction Review Code  2- meets goals/outcomes      Hands Only CPR Anytime:  -Group instruction provided by verbal instruction, video, patient participation and written materials to support subject.  Instructors co-teach with AHA video for hands only CPR.  Participants get hands on experience with mannequins.   Nutrition I class: Heart Healthy Eating:  -Group instruction provided by PowerPoint slides,  verbal discussion, and written materials to support subject matter. The instructor gives an explanation and review of the Therapeutic Lifestyle Changes diet recommendations, which includes a discussion on lipid goals, dietary fat, sodium, fiber, plant stanol/sterol esters, sugar, and the components of a well-balanced, healthy diet. Flowsheet Row CARDIAC REHAB PHASE II EXERCISE from 10/01/2016 in [facility]  Date  09/30/16  Educator  RD  Instruction Review Code  2- meets goals/outcomes      Nutrition II class: Lifestyle Skills:  -Group instruction provided by PowerPoint slides, verbal discussion, and written materials to support subject matter. The instructor gives an explanation and review of label reading, grocery shopping for heart health, heart healthy recipe modifications, and ways to make healthier choices when eating out.   Diabetes Question & Answer:  -Group instruction provided by PowerPoint slides, verbal discussion, and written materials to support subject matter. The instructor gives an explanation and review of diabetes co-morbidities, pre- and post-prandial blood glucose goals, pre-exercise blood glucose goals, signs, symptoms, and treatment of hypoglycemia and hyperglycemia, and foot care basics.   Diabetes Blitz:  -Group instruction provided by PowerPoint slides, verbal discussion, and written materials to support subject matter. The instructor gives an explanation and review of the physiology behind type 1 and type 2 diabetes, diabetes medications and rational behind using different medications, pre- and post-prandial blood glucose recommendations and Hemoglobin A1c goals, diabetes diet, and exercise including blood glucose guidelines for exercising safely.    Portion Distortion:  -Group instruction provided by PowerPoint slides, verbal discussion, written materials, and food models to support subject matter. The instructor gives an explanation of  serving size versus portion size, changes in portions sizes over the last 20 years, and what consists of a serving from each food group.   Stress Management:  -  Group instruction provided by verbal instruction, video, and written materials to support subject matter.  Instructors review role of stress in heart disease and how to cope with stress positively.     Exercising on Your Own:  -Group instruction provided by verbal instruction, power point, and written materials to support subject.  Instructors discuss benefits of exercise, components of exercise, frequency and intensity of exercise, and end points for exercise.  Also discuss use of nitroglycerin and activating EMS.  Review options of places to exercise outside of rehab.  Review guidelines for sex with heart disease.   Cardiac Drugs I:  -Group instruction provided by verbal instruction and written materials to support subject.  Instructor reviews cardiac drug classes: antiplatelets, anticoagulants, beta blockers, and statins.  Instructor discusses reasons, side effects, and lifestyle considerations for each drug class.   Cardiac Drugs II:  -Group instruction provided by verbal instruction and written materials to support subject.  Instructor reviews cardiac drug classes: angiotensin converting enzyme inhibitors (ACE-I), angiotensin II receptor blockers (ARBs), nitrates, and calcium channel blockers.  Instructor discusses reasons, side effects, and lifestyle considerations for each drug class. Flowsheet Row CARDIAC REHAB PHASE II EXERCISE from 10/01/2016 in [facility]  Date  10/01/16  Instruction Review Code  2- meets goals/outcomes      Anatomy and Physiology of the Circulatory System:  -Group instruction provided by verbal instruction, video, and written materials to support subject.  Reviews functional anatomy of heart, how it relates to various diagnoses, and what role the heart plays in the overall  system. Flowsheet Row CARDIAC REHAB PHASE II EXERCISE from 10/01/2016 in [facility]  Date  09/24/16  Instruction Review Code  2- meets goals/outcomes      Knowledge Questionnaire Score:     Knowledge Questionnaire Score - 09/04/16 1147      Knowledge Questionnaire Score   Pre Score 20/24      Core Components/Risk Factors/Patient Goals at Admission:     Personal Goals and Risk Factors at Admission - 09/04/16 0909      Core Components/Risk Factors/Patient Goals on Admission    Weight Management Yes;Weight Loss   Intervention Weight Management: Develop a combined nutrition and exercise program designed to reach desired caloric intake, while maintaining appropriate intake of nutrient and fiber, sodium and fats, and appropriate energy expenditure required for the weight goal.;Weight Management: Provide education and appropriate resources to help participant work on and attain dietary goals.;Weight Management/Obesity: Establish reasonable short term and long term weight goals.;Obesity: Provide education and appropriate resources to help participant work on and attain dietary goals.   Expected Outcomes Short Term: Continue to assess and modify interventions until short term weight is achieved;Weight Maintenance: Understanding of the daily nutrition guidelines, which includes 25-35% calories from fat, 7% or less cal from saturated fats, less than 200mg  cholesterol, less than 1.5gm of sodium, & 5 or more servings of fruits and vegetables daily;Long Term: Adherence to nutrition and physical activity/exercise program aimed toward attainment of established weight goal;Understanding recommendations for meals to include 15-35% energy as protein, 25-35% energy from fat, 35-60% energy from carbohydrates, less than 200mg  of dietary cholesterol, 20-35 gm of total fiber daily;Weight Loss: Understanding of general recommendations for a balanced deficit meal plan, which promotes  1-2 lb weight loss per week and includes a negative energy balance of [phone] kcal/d;Understanding of distribution of calorie intake throughout the day with the consumption of 4-5 meals/snacks   Sedentary Yes  Intervention Provide advice, education, support and counseling about physical activity/exercise needs.;Develop an individualized exercise prescription for aerobic and resistive training based on initial evaluation findings, risk stratification, comorbidities and participant's personal goals.   Expected Outcomes Achievement of increased cardiorespiratory fitness and enhanced flexibility, muscular endurance and strength shown through measurements of functional capacity and personal statement of participant.   Increase Strength and Stamina Yes   Intervention Provide advice, education, support and counseling about physical activity/exercise needs.;Develop an individualized exercise prescription for aerobic and resistive training based on initial evaluation findings, risk stratification, comorbidities and participant's personal goals.   Expected Outcomes Achievement of increased cardiorespiratory fitness and enhanced flexibility, muscular endurance and strength shown through measurements of functional capacity and personal statement of participant.   Lipids Yes   Intervention Provide education and support for participant on nutrition & aerobic/resistive exercise along with prescribed medications to achieve LDL 70mg , HDL >40mg .   Expected Outcomes Short Term: Participant states understanding of desired cholesterol values and is compliant with medications prescribed. Participant is following exercise prescription and nutrition guidelines.;Long Term: Cholesterol controlled with medications as prescribed, with individualized exercise RX and with personalized nutrition plan. Value goals: LDL < 70mg , HDL > 40 mg.   Personal Goal Other Yes   Personal Goal Improve strength and get back resistance training.  Improve breathing quality   Intervention Provide education on diaphragmatic breathing. Provide exercise guidelines to improve cardiovascular fitness   Expected Outcomes Pt will be able return to resistance training to improve strength and be able to breath better.      Core Components/Risk Factors/Patient Goals Review:      Goals and Risk Factor Review    Row Name 09/30/16 0957             Core Components/Risk Factors/Patient Goals Review   Personal Goals Review Other;Increase Strength and Stamina       Review Pt has increase size in handheld weights and feels as though getting stronger. Pt breathing has also improved       Expected Outcomes Pt will continue to improve in breathing/aerobic capacity and build in strength.          Core Components/Risk Factors/Patient Goals at Discharge (Final Review):      Goals and Risk Factor Review - 09/30/16 0957      Core Components/Risk Factors/Patient Goals Review   Personal Goals Review Other;Increase Strength and Stamina   Review Pt has increase size in handheld weights and feels as though getting stronger. Pt breathing has also improved   Expected Outcomes Pt will continue to improve in breathing/aerobic capacity and build in strength.      ITP Comments:     ITP Comments    Row Name 09/04/16 0905           ITP Comments Dr. Fransico Him, Medical Director          Comments: Pt is making expected progress toward personal goals after completing 10 sessions. Recommend continued exercise and life style modification education including  stress management and relaxation techniques to decrease cardiac risk profile.

## 2016-10-02 ENCOUNTER — Ambulatory Visit (HOSPITAL_COMMUNITY)
Admission: RE | Admit: 2016-10-02 | Discharge: 2016-10-02 | Disposition: A | Discharge status: Home / Self Care | Payer: BLUE CROSS/BLUE SHIELD | Source: Ambulatory Visit | Attending: Cardiology | Admitting: Cardiology

## 2016-10-02 DIAGNOSIS — R0989 Other specified symptoms and signs involving the circulatory and respiratory systems: Secondary | ICD-10-CM

## 2016-10-02 DIAGNOSIS — M79605 Pain in left leg: Secondary | ICD-10-CM | POA: Diagnosis present

## 2016-10-02 DIAGNOSIS — M79604 Pain in right leg: Secondary | ICD-10-CM | POA: Insufficient documentation

## 2016-10-02 DIAGNOSIS — I739 Peripheral vascular disease, unspecified: Secondary | ICD-10-CM | POA: Diagnosis not present

## 2016-10-03 ENCOUNTER — Encounter (HOSPITAL_COMMUNITY)
Admission: RE | Admit: 2016-10-03 | Discharge: 2016-10-03 | Disposition: A | Discharge status: Home / Self Care | Payer: BLUE CROSS/BLUE SHIELD | Source: Ambulatory Visit | Attending: Cardiology | Admitting: Cardiology

## 2016-10-03 DIAGNOSIS — Z48812 Encounter for surgical aftercare following surgery on the circulatory system: Secondary | ICD-10-CM | POA: Diagnosis not present

## 2016-10-03 DIAGNOSIS — I2102 ST elevation (STEMI) myocardial infarction involving left anterior descending coronary artery: Secondary | ICD-10-CM

## 2016-10-06 ENCOUNTER — Encounter (HOSPITAL_COMMUNITY)
Admission: RE | Admit: 2016-10-06 | Discharge: 2016-10-06 | Disposition: A | Discharge status: Home / Self Care | Payer: BLUE CROSS/BLUE SHIELD | Source: Ambulatory Visit | Attending: Cardiology | Admitting: Cardiology

## 2016-10-06 DIAGNOSIS — Z48812 Encounter for surgical aftercare following surgery on the circulatory system: Secondary | ICD-10-CM | POA: Diagnosis not present

## 2016-10-06 DIAGNOSIS — I2102 ST elevation (STEMI) myocardial infarction involving left anterior descending coronary artery: Secondary | ICD-10-CM

## 2016-10-08 ENCOUNTER — Encounter (HOSPITAL_COMMUNITY)
Admission: RE | Admit: 2016-10-08 | Discharge: 2016-10-08 | Disposition: A | Discharge status: Home / Self Care | Payer: BLUE CROSS/BLUE SHIELD | Source: Ambulatory Visit | Attending: Cardiology | Admitting: Cardiology

## 2016-10-08 DIAGNOSIS — Z48812 Encounter for surgical aftercare following surgery on the circulatory system: Secondary | ICD-10-CM | POA: Diagnosis not present

## 2016-10-08 DIAGNOSIS — I2102 ST elevation (STEMI) myocardial infarction involving left anterior descending coronary artery: Secondary | ICD-10-CM

## 2016-10-10 ENCOUNTER — Encounter (HOSPITAL_COMMUNITY)
Admission: RE | Admit: 2016-10-10 | Discharge: 2016-10-10 | Disposition: A | Discharge status: Home / Self Care | Payer: BLUE CROSS/BLUE SHIELD | Source: Ambulatory Visit | Attending: Cardiology | Admitting: Cardiology

## 2016-10-10 DIAGNOSIS — Z48812 Encounter for surgical aftercare following surgery on the circulatory system: Secondary | ICD-10-CM | POA: Diagnosis not present

## 2016-10-10 DIAGNOSIS — I2102 ST elevation (STEMI) myocardial infarction involving left anterior descending coronary artery: Secondary | ICD-10-CM

## 2016-10-13 ENCOUNTER — Encounter (HOSPITAL_COMMUNITY)
Admission: RE | Admit: 2016-10-13 | Discharge: 2016-10-13 | Disposition: A | Discharge status: Home / Self Care | Payer: BLUE CROSS/BLUE SHIELD | Source: Ambulatory Visit | Attending: Cardiology | Admitting: Cardiology

## 2016-10-13 DIAGNOSIS — I2102 ST elevation (STEMI) myocardial infarction involving left anterior descending coronary artery: Secondary | ICD-10-CM

## 2016-10-13 DIAGNOSIS — Z48812 Encounter for surgical aftercare following surgery on the circulatory system: Secondary | ICD-10-CM | POA: Diagnosis not present

## 2016-10-15 ENCOUNTER — Encounter (HOSPITAL_COMMUNITY)
Admission: RE | Admit: 2016-10-15 | Discharge: 2016-10-15 | Disposition: A | Discharge status: Home / Self Care | Payer: BLUE CROSS/BLUE SHIELD | Source: Ambulatory Visit | Attending: Cardiology | Admitting: Cardiology

## 2016-10-15 DIAGNOSIS — Z48812 Encounter for surgical aftercare following surgery on the circulatory system: Secondary | ICD-10-CM | POA: Diagnosis not present

## 2016-10-15 DIAGNOSIS — I2102 ST elevation (STEMI) myocardial infarction involving left anterior descending coronary artery: Secondary | ICD-10-CM

## 2016-10-20 ENCOUNTER — Encounter (HOSPITAL_COMMUNITY)
Admission: RE | Admit: 2016-10-20 | Discharge: 2016-10-20 | Disposition: A | Discharge status: Home / Self Care | Payer: BLUE CROSS/BLUE SHIELD | Source: Ambulatory Visit | Attending: Cardiology | Admitting: Cardiology

## 2016-10-20 DIAGNOSIS — Z48812 Encounter for surgical aftercare following surgery on the circulatory system: Secondary | ICD-10-CM | POA: Diagnosis not present

## 2016-10-20 DIAGNOSIS — I2102 ST elevation (STEMI) myocardial infarction involving left anterior descending coronary artery: Secondary | ICD-10-CM

## 2016-10-22 ENCOUNTER — Encounter (HOSPITAL_COMMUNITY)
Admission: RE | Admit: 2016-10-22 | Discharge: 2016-10-22 | Disposition: A | Discharge status: Home / Self Care | Payer: BLUE CROSS/BLUE SHIELD | Source: Ambulatory Visit | Attending: Cardiology | Admitting: Cardiology

## 2016-10-22 DIAGNOSIS — I2102 ST elevation (STEMI) myocardial infarction involving left anterior descending coronary artery: Secondary | ICD-10-CM

## 2016-10-22 DIAGNOSIS — Z48812 Encounter for surgical aftercare following surgery on the circulatory system: Secondary | ICD-10-CM | POA: Diagnosis not present

## 2016-10-24 ENCOUNTER — Encounter (HOSPITAL_COMMUNITY)
Admission: RE | Admit: 2016-10-24 | Discharge: 2016-10-24 | Disposition: A | Discharge status: Home / Self Care | Payer: BLUE CROSS/BLUE SHIELD | Source: Ambulatory Visit | Attending: Cardiology | Admitting: Cardiology

## 2016-10-24 DIAGNOSIS — Z48812 Encounter for surgical aftercare following surgery on the circulatory system: Secondary | ICD-10-CM | POA: Insufficient documentation

## 2016-10-24 DIAGNOSIS — I213 ST elevation (STEMI) myocardial infarction of unspecified site: Secondary | ICD-10-CM | POA: Insufficient documentation

## 2016-10-24 DIAGNOSIS — Z955 Presence of coronary angioplasty implant and graft: Secondary | ICD-10-CM | POA: Insufficient documentation

## 2016-10-27 ENCOUNTER — Encounter (HOSPITAL_COMMUNITY)
Admission: RE | Admit: 2016-10-27 | Discharge: 2016-10-27 | Disposition: A | Discharge status: Home / Self Care | Payer: BLUE CROSS/BLUE SHIELD | Source: Ambulatory Visit | Attending: Cardiology | Admitting: Cardiology

## 2016-10-27 DIAGNOSIS — I213 ST elevation (STEMI) myocardial infarction of unspecified site: Secondary | ICD-10-CM | POA: Diagnosis not present

## 2016-10-27 DIAGNOSIS — I2102 ST elevation (STEMI) myocardial infarction involving left anterior descending coronary artery: Secondary | ICD-10-CM

## 2016-10-27 DIAGNOSIS — Z48812 Encounter for surgical aftercare following surgery on the circulatory system: Secondary | ICD-10-CM | POA: Diagnosis not present

## 2016-10-27 DIAGNOSIS — Z955 Presence of coronary angioplasty implant and graft: Secondary | ICD-10-CM | POA: Diagnosis not present

## 2016-10-29 ENCOUNTER — Encounter (HOSPITAL_COMMUNITY)
Admission: RE | Admit: 2016-10-29 | Discharge: 2016-10-29 | Disposition: A | Discharge status: Home / Self Care | Payer: BLUE CROSS/BLUE SHIELD | Source: Ambulatory Visit | Attending: Cardiology | Admitting: Cardiology

## 2016-10-29 DIAGNOSIS — Z48812 Encounter for surgical aftercare following surgery on the circulatory system: Secondary | ICD-10-CM | POA: Diagnosis not present

## 2016-10-29 DIAGNOSIS — I2102 ST elevation (STEMI) myocardial infarction involving left anterior descending coronary artery: Secondary | ICD-10-CM

## 2016-10-29 NOTE — Progress Notes (Signed)
Cardiac Individual Treatment Plan  Patient Details  Name: James [name] MRN: [id number] Date of Birth: July 08, 1956 Referring Provider:   Flowsheet Row CARDIAC REHAB PHASE II ORIENTATION from 09/04/2016 in [facility]  Referring Provider  Fransico Him, MD      Initial Encounter Date:  [place] PHASE II ORIENTATION from 09/04/2016 in [facility]  Date  09/04/16  Referring Provider  Fransico Him, MD      Visit Diagnosis: 07/29/16 ST elevation myocardial infarction involving left anterior descending (LAD) coronary artery (HCC)  Patient's Home Medications on Admission:  Current Outpatient Prescriptions:  .  aspirin EC 81 MG tablet, Take 81 mg by mouth daily., Disp: , Rfl:  .  atorvastatin (LIPITOR) 80 MG tablet, Take 80 mg by mouth daily., Disp: , Rfl:  .  Diclofenac Sodium (PENNSAID) 2 % SOLN, Place 2 application onto the skin 2 (two) times daily., Disp: 112 g, Rfl: 3 .  lisinopril (PRINIVIL,ZESTRIL) 2.5 MG tablet, Take 2.5 mg by mouth daily., Disp: , Rfl:  .  metoprolol tartrate (LOPRESSOR) 25 MG tablet, Take 12.5 mg by mouth at bedtime., Disp: , Rfl:  .  nitroGLYCERIN (NITROSTAT) 0.4 MG SL tablet, Place 0.4 mg under the tongue every 5 (five) minutes as needed for chest pain., Disp: , Rfl:  .  ticagrelor (BRILINTA) 90 MG TABS tablet, Take by mouth 2 (two) times daily., Disp: , Rfl:  .  Vitamin D, Ergocalciferol, (DRISDOL) 50000 units CAPS capsule, Take 1 capsule (50,000 Units total) by mouth every 7 (seven) days., Disp: 12 capsule, Rfl: 0  Past Medical History: Past Medical History:  Diagnosis Date  . Colon polyps   . Hyperlipidemia   . MI (myocardial infarction)   . Tinea versicolor     Tobacco Use: History  Smoking Status  . Former Smoker  . Quit date: 11/24/1978  Smokeless Tobacco  . Never Used    Labs: Recent Review Flowsheet Data    Labs for ITP Cardiac and Pulmonary Rehab Latest Ref Rng &  Units 11/05/2007 08/03/2012 08/04/2014   Cholestrol 0 - 200 mg/dL 238(HH) 204(H) 206(H)   LDLDIRECT mg/dL 142.5 138.0 136.7   HDL >39.00 mg/dL 31.6(L) 45.70 30.10(L)   Trlycerides 0.0 - 149.0 mg/dL 355(HH) 92.0 203.0(H)      Capillary Blood Glucose: No results found for: GLUCAP   Exercise Target Goals:    Exercise Program Goal: Individual exercise prescription set with THRR, safety & activity barriers. Participant demonstrates ability to understand and report RPE using BORG scale, to self-measure pulse accurately, and to acknowledge the importance of the exercise prescription.  Exercise Prescription Goal: Starting with aerobic activity 30 plus minutes a day, 3 days per week for initial exercise prescription. Provide home exercise prescription and guidelines that participant acknowledges understanding prior to discharge.  Activity Barriers & Risk Stratification:     Activity Barriers & Cardiac Risk Stratification - 09/04/16 0909      Activity Barriers & Cardiac Risk Stratification   Comments B knee pain   Cardiac Risk Stratification High      6 Minute Walk:     6 Minute Walk    Row Name 09/04/16 1145         6 Minute Walk   Phase Initial     Distance 1654 feet     Walk Time 6 minutes     # of Rest Breaks 0     MPH 3.13     METS  3.72     RPE 11     Perceived Dyspnea  0     VO2 Peak 13.03     Symptoms No     Resting HR 68 bpm     Resting BP 108/74     Max Ex. HR 89 bpm     Max Ex. BP 122/78     2 Minute Post BP 108/70        Initial Exercise Prescription:     Initial Exercise Prescription - 09/04/16 1200      Date of Initial Exercise RX and Referring Provider   Date 09/04/16   Referring Provider Fransico Him, MD     Treadmill   MPH 2.8   Grade 1   Minutes 10   METs 3.53     Bike   Level 1   Minutes 10   METs 3.1     NuStep   Level 3   Minutes 10   METs 2     Track   Laps 12   Minutes 10   METs 3.09     Prescription Details    Frequency (times per week) 3   Duration Progress to 45 minutes of aerobic exercise without signs/symptoms of physical distress     Intensity   THRR 40-80% of Max Heartrate 63-126   Ratings of Perceived Exertion 11-13   Perceived Dyspnea 0-4     Progression   Progression Continue progressive overload as per policy without signs/symptoms or physical distress.     Resistance Training   Training Prescription Yes   Weight 2   Reps 10-12      Perform Capillary Blood Glucose checks as needed.  Exercise Prescription Changes:     Exercise Prescription Changes    Row Name 09/30/16 0900 10/28/16 0900           Exercise Review   Progression Yes Yes        Response to Exercise   Blood Pressure (Admit) 122/80 104/68      Blood Pressure (Exercise) 134/70 160/80      Blood Pressure (Exit) 112/70 104/64      Heart Rate (Admit) 83 bpm 84 bpm      Heart Rate (Exercise) 125 bpm 118 bpm      Heart Rate (Exit) 89 bpm 84 bpm      Rating of Perceived Exertion (Exercise) 12 12      Symptoms  - none      Comments Reviewed HEP on 10/23 Reviewed HEP on 10/23      Duration Progress to 30 minutes of continuous aerobic without signs/symptoms of physical distress Progress to 30 minutes of continuous aerobic without signs/symptoms of physical distress      Intensity THRR unchanged THRR unchanged        Progression   Average METs 3.6 4.2        Resistance Training   Training Prescription Yes Yes      Weight 5lbs 5lbs      Reps 10-12 10-12        Treadmill   MPH 3 3      Grade 2 2      Minutes 10 10      METs 4.12 4.12        Bike   Level 1 1.5      Minutes 10 10      METs 3.07 4.14        NuStep   Level 4 5  Minutes 10 10      METs 3.6 4.2        Track   Laps 12 12      Minutes 10 10      METs 3.09 3.09        Home Exercise Plan   Plans to continue exercise at [facility] on 09/15/16. See progress note Home  Reviewed HEP on 09/15/16. See progress note       Frequency Add 3 additional days to program exercise sessions. Add 3 additional days to program exercise sessions.         Exercise Comments:     Exercise Comments    Row Name 09/30/16 0957 10/24/16 0811 10/28/16 0953       Exercise Comments Reviewed METs and goals. Pt is tolerating exercise well; will continue to monitor exercise progression. Reviewed METs and goals. Pt is tolerating exercise well; will continue to monitor exercise progression. Reviewed METs and goals. Pt is tolerating exercise well; will continue to monitor exercise progression.        Discharge Exercise Prescription (Final Exercise Prescription Changes):     Exercise Prescription Changes - 10/28/16 0900      Exercise Review   Progression Yes     Response to Exercise   Blood Pressure (Admit) 104/68   Blood Pressure (Exercise) 160/80   Blood Pressure (Exit) 104/64   Heart Rate (Admit) 84 bpm   Heart Rate (Exercise) 118 bpm   Heart Rate (Exit) 84 bpm   Rating of Perceived Exertion (Exercise) 12   Symptoms none   Comments Reviewed HEP on 10/23   Duration Progress to 30 minutes of continuous aerobic without signs/symptoms of physical distress   Intensity THRR unchanged     Progression   Average METs 4.2     Resistance Training   Training Prescription Yes   Weight 5lbs   Reps 10-12     Treadmill   MPH 3   Grade 2   Minutes 10   METs 4.12     Bike   Level 1.5   Minutes 10   METs 4.14     NuStep   Level 5   Minutes 10   METs 4.2     Track   Laps 12   Minutes 10   METs 3.09     Home Exercise Plan   Plans to continue exercise at Home  Reviewed HEP on 09/15/16. See progress note   Frequency Add 3 additional days to program exercise sessions.      Nutrition:  Target Goals: Understanding of nutrition guidelines, daily intake of sodium 1500mg , cholesterol 200mg , calories 30% from fat and 7% or less from saturated fats, daily to have 5 or more servings of fruits and  vegetables.  Biometrics:     Pre Biometrics - 09/04/16 1237      Pre Biometrics   Waist Circumference 41 inches   Hip Circumference 43 inches   Waist to Hip Ratio 0.95 %       Nutrition Therapy Plan and Nutrition Goals:     Nutrition Therapy & Goals - 09/05/16 1217      Nutrition Therapy   Diet Therapeutic Lifestyle Changes     Personal Nutrition Goals   Personal Goal #1 1-2 lb wt loss/week to a wt loss goal of 6-24 lb at graduation from [facility], educate and counsel regarding individualized specific dietary modifications aiming towards targeted  core components such as weight, hypertension, lipid management, diabetes, heart failure and other comorbidities.   Expected Outcomes Short Term Goal: Understand basic principles of dietary content, such as calories, fat, sodium, cholesterol and nutrients.;Long Term Goal: Adherence to prescribed nutrition plan.      Nutrition Discharge: Nutrition Scores:     Nutrition Assessments - 09/05/16 1211      MEDFICTS Scores   Pre Score 38      Nutrition Goals Re-Evaluation:   Psychosocial: Target Goals: Acknowledge presence or absence of depression, maximize coping skills, provide positive support system. Participant is able to verbalize types and ability to use techniques and skills needed for reducing stress and depression.  Initial Review & Psychosocial Screening:     Initial Psych Review & Screening - 09/05/16 0854      Family Dynamics   Good Support System? Yes   Comments Wife accompained pt to orientation.     Barriers   Psychosocial barriers to participate in program There are no identifiable barriers or psychosocial needs.      Quality of Life Scores:     Quality of Life - 09/04/16 1146      Quality of Life Scores   Health/Function Pre 25.6 %   Socioeconomic Pre 29.69 %   Psych/Spiritual Pre 30 %   Family Pre 27.6 %   GLOBAL Pre 27.7 %       PHQ-9: Recent Review Flowsheet Data    Depression screen PHQ 2/9 09/08/2016   Decreased Interest 0   Down, Depressed, Hopeless 0   PHQ - 2 Score 0      Psychosocial Evaluation and Intervention:     Psychosocial Evaluation - 09/30/16 0859      Psychosocial Evaluation & Interventions   Interventions Encouraged to exercise with the program and follow exercise prescription   Comments no psychosocial needs identified, no inteventions necessary. pt is walkng and stretching on his own at home.  He reports he has also improved his diet.    Continued Psychosocial Services Needed No      Psychosocial Re-Evaluation:     Psychosocial Re-Evaluation    [place] Name 10/27/16 [phone]             Psychosocial Re-Evaluation   Interventions Encouraged to attend Cardiac Rehabilitation for the exercise       Comments no psychosocial needs identified, no interventions necessary . pt is walking and stretching at home on his own.  pt is looking forward to being able to bowl and play golf again soon.       Continued Psychosocial Services Needed No          Vocational Rehabilitation: Provide vocational rehab assistance to qualifying candidates.   Vocational Rehab Evaluation & Intervention:     Vocational Rehab - 09/05/16 0855      Initial Vocational Rehab Evaluation & Intervention   Assessment shows need for Vocational Rehabilitation Yes  pt has returned back to work      Education: Education Goals: Education classes will be provided on a weekly basis, covering required topics. Participant will state understanding/return demonstration of topics presented.  Learning Barriers/Preferences:     Learning Barriers/Preferences - 09/04/16 1438      Learning Barriers/Preferences   Learning Barriers None;Sight      Education Topics: Count Your Pulse:  -Group instruction provided by verbal instruction, demonstration, patient participation and written materials to support subject.   Instructors address importance of being able to find your pulse and how to count  your pulse when at home without a heart monitor.  Patients get hands on experience counting their pulse with staff help and individually. Flowsheet Row CARDIAC REHAB PHASE II EXERCISE from 10/29/2016 in [facility]  Date  09/26/16  Educator  Andi Hence, RN  Instruction Review Code  2- meets goals/outcomes      Heart Attack, Angina, and Risk Factor Modification:  -Group instruction provided by verbal instruction, video, and written materials to support subject.  Instructors address signs and symptoms of angina and heart attacks.    Also discuss risk factors for heart disease and how to make changes to improve heart health risk factors. Flowsheet Row CARDIAC REHAB PHASE II EXERCISE from 10/29/2016 in [facility]  Date  09/10/16  Instruction Review Code  2- meets goals/outcomes      Functional Fitness:  -Group instruction provided by verbal instruction, demonstration, patient participation, and written materials to support subject.  Instructors address safety measures for doing things around the house.  Discuss how to get up and down off the floor, how to pick things up properly, how to safely get out of a chair without assistance, and balance training. Flowsheet Row CARDIAC REHAB PHASE II EXERCISE from 10/29/2016 in [facility]  Date  10/03/16  Instruction Review Code  2- meets goals/outcomes      Meditation and Mindfulness:  -Group instruction provided by verbal instruction, patient participation, and written materials to support subject.  Instructor addresses importance of mindfulness and meditation practice to help reduce stress and improve awareness.  Instructor also leads participants through a meditation exercise.  Flowsheet Row CARDIAC REHAB PHASE II EXERCISE from 10/29/2016 in [facility]  Date  09/17/16  Instruction Review Code  2- meets goals/outcomes      Stretching for Flexibility and Mobility:  -Group instruction provided by verbal instruction, patient participation, and written materials to support subject.  Instructors lead participants through series of stretches that are designed to increase flexibility thus improving mobility.  These stretches are additional exercise for major muscle groups that are typically performed during regular warm up and cool down. Flowsheet Row CARDIAC REHAB PHASE II EXERCISE from 10/29/2016 in [facility]  Date  10/29/16  Instruction Review Code  2- meets goals/outcomes      Hands Only CPR Anytime:  -Group instruction provided by verbal instruction, video, patient participation and written materials to support subject.  Instructors co-teach with [facility] video for hands only CPR.  Participants get hands on experience with mannequins.   Nutrition I class: Heart Healthy Eating:  -Group instruction provided by PowerPoint slides, verbal discussion, and written materials to support subject matter. The instructor gives an explanation and review of the Therapeutic Lifestyle Changes diet recommendations, which includes a discussion on lipid goals, dietary fat, sodium, fiber, plant stanol/sterol esters, sugar, and the components of a well-balanced, healthy diet. Flowsheet Row CARDIAC REHAB PHASE II EXERCISE from 10/29/2016 in [facility]  Date  09/30/16  Educator  RD  Instruction Review Code  2- meets goals/outcomes      Nutrition II class: Lifestyle Skills:  -Group instruction provided by PowerPoint slides, verbal discussion, and written materials to support subject matter. The instructor gives an explanation and review of label reading, grocery shopping for heart health, heart healthy recipe modifications, and ways to make healthier choices when eating out. [facility]  REHAB PHASE  II EXERCISE from 10/29/2016 in [facility]  Date  10/07/16  Educator  RD  Instruction Review Code  2- meets goals/outcomes      Diabetes Question & Answer:  -Group instruction provided by PowerPoint slides, verbal discussion, and written materials to support subject matter. The instructor gives an explanation and review of diabetes co-morbidities, pre- and post-prandial blood glucose goals, pre-exercise blood glucose goals, signs, symptoms, and treatment of hypoglycemia and hyperglycemia, and foot care basics.   Diabetes Blitz:  -Group instruction provided by PowerPoint slides, verbal discussion, and written materials to support subject matter. The instructor gives an explanation and review of the physiology behind type 1 and type 2 diabetes, diabetes medications and rational behind using different medications, pre- and post-prandial blood glucose recommendations and Hemoglobin A1c goals, diabetes diet, and exercise including blood glucose guidelines for exercising safely.    Portion Distortion:  -Group instruction provided by PowerPoint slides, verbal discussion, written materials, and food models to support subject matter. The instructor gives an explanation of serving size versus portion size, changes in portions sizes over the last 20 years, and what consists of a serving from each food group. Flowsheet Row CARDIAC REHAB PHASE II EXERCISE from 10/29/2016 in [facility]  Date  10/22/16 [Holiday Eating Survival Tips]  Educator  RD  Instruction Review Code  2- meets goals/outcomes      Stress Management:  -Group instruction provided by verbal instruction, video, and written materials to support subject matter.  Instructors review role of stress in heart disease and how to cope with stress positively.   Flowsheet Row CARDIAC REHAB PHASE II EXERCISE from 10/29/2016 in [facility]  Date   10/08/16  Instruction Review Code  2- meets goals/outcomes      Exercising on Your Own:  -Group instruction provided by verbal instruction, power point, and written materials to support subject.  Instructors discuss benefits of exercise, components of exercise, frequency and intensity of exercise, and end points for exercise.  Also discuss use of nitroglycerin and activating EMS.  Review options of places to exercise outside of rehab.  Review guidelines for sex with heart disease. Flowsheet Row CARDIAC REHAB PHASE II EXERCISE from 10/29/2016 in [facility]  Date  10/10/16  Instruction Review Code  2- meets goals/outcomes      Cardiac Drugs I:  -Group instruction provided by verbal instruction and written materials to support subject.  Instructor reviews cardiac drug classes: antiplatelets, anticoagulants, beta blockers, and statins.  Instructor discusses reasons, side effects, and lifestyle considerations for each drug class.   Cardiac Drugs II:  -Group instruction provided by verbal instruction and written materials to support subject.  Instructor reviews cardiac drug classes: angiotensin converting enzyme inhibitors (ACE-I), angiotensin II receptor blockers (ARBs), nitrates, and calcium channel blockers.  Instructor discusses reasons, side effects, and lifestyle considerations for each drug class. Flowsheet Row CARDIAC REHAB PHASE II EXERCISE from 10/29/2016 in [facility]  Date  10/01/16  Instruction Review Code  2- meets goals/outcomes      Anatomy and Physiology of the Circulatory System:  -Group instruction provided by verbal instruction, video, and written materials to support subject.  Reviews functional anatomy of heart, how it relates to various diagnoses, and what role the heart plays in the overall system. Flowsheet Row CARDIAC REHAB PHASE II EXERCISE from 10/29/2016 in [facility]  Date  09/24/16  Instruction Review Code  2- meets goals/outcomes      Knowledge Questionnaire Score:     Knowledge Questionnaire Score - 09/04/16 1147      Knowledge Questionnaire Score   Pre Score 20/24      Core Components/Risk Factors/Patient Goals at Admission:     Personal Goals and Risk Factors at Admission - 09/04/16 0909      Core Components/Risk Factors/Patient Goals on Admission    Weight Management Yes;Weight Loss   Intervention Weight Management: Develop a combined nutrition and exercise program designed to reach desired caloric intake, while maintaining appropriate intake of nutrient and fiber, sodium and fats, and appropriate energy expenditure required for the weight goal.;Weight Management: Provide education and appropriate resources to help participant work on and attain dietary goals.;Weight Management/Obesity: Establish reasonable short term and long term weight goals.;Obesity: Provide education and appropriate resources to help participant work on and attain dietary goals.   Expected Outcomes Short Term: Continue to assess and modify interventions until short term weight is achieved;Weight Maintenance: Understanding of the daily nutrition guidelines, which includes 25-35% calories from fat, 7% or less cal from saturated fats, less than 200mg  cholesterol, less than 1.5gm of sodium, & 5 or more servings of fruits and vegetables daily;Long Term: Adherence to nutrition and physical activity/exercise program aimed toward attainment of established weight goal;Understanding recommendations for meals to include 15-35% energy as protein, 25-35% energy from fat, 35-60% energy from carbohydrates, less than 200mg  of dietary cholesterol, 20-35 gm of total fiber daily;Weight Loss: Understanding of general recommendations for a balanced deficit meal plan, which promotes 1-2 lb weight loss per week and includes a negative energy balance of [phone] kcal/d;Understanding of distribution of calorie  intake throughout the day with the consumption of 4-5 meals/snacks   Sedentary Yes   Intervention Provide advice, education, support and counseling about physical activity/exercise needs.;Develop an individualized exercise prescription for aerobic and resistive training based on initial evaluation findings, risk stratification, comorbidities and participant's personal goals.   Expected Outcomes Achievement of increased cardiorespiratory fitness and enhanced flexibility, muscular endurance and strength shown through measurements of functional capacity and personal statement of participant.   Increase Strength and Stamina Yes   Intervention Provide advice, education, support and counseling about physical activity/exercise needs.;Develop an individualized exercise prescription for aerobic and resistive training based on initial evaluation findings, risk stratification, comorbidities and participant's personal goals.   Expected Outcomes Achievement of increased cardiorespiratory fitness and enhanced flexibility, muscular endurance and strength shown through measurements of functional capacity and personal statement of participant.   Lipids Yes   Intervention Provide education and support for participant on nutrition & aerobic/resistive exercise along with prescribed medications to achieve LDL 70mg , HDL >40mg .   Expected Outcomes Short Term: Participant states understanding of desired cholesterol values and is compliant with medications prescribed. Participant is following exercise prescription and nutrition guidelines.;Long Term: Cholesterol controlled with medications as prescribed, with individualized exercise RX and with personalized nutrition plan. Value goals: LDL < 70mg , HDL > 40 mg.   Personal Goal Other Yes   Personal Goal Improve strength and get back resistance training. Improve breathing quality   Intervention Provide education on diaphragmatic breathing. Provide exercise guidelines to improve  cardiovascular fitness   Expected Outcomes Pt will be able return to resistance training to improve strength and be able to breath better.      Core Components/Risk Factors/Patient Goals Review:      Goals and Risk Factor Review    Row Name 09/30/16  [id number] 10/23/16 1551           Core Components/Risk Factors/Patient Goals Review   Personal Goals Review Other;Increase Strength and Stamina Other;Increase Strength and Stamina      Review Pt has increase size in handheld weights and feels as though getting stronger. Pt breathing has also improved Pt is interested in strength training. A request was sent to cardiologist. received clearance and will initiate weight lifiting on Friday 10/25/16      Expected Outcomes Pt will continue to improve in breathing/aerobic capacity and build in strength. Pt will get stronger and build in lean muscle mass         Core Components/Risk Factors/Patient Goals at Discharge (Final Review):      Goals and Risk Factor Review - 10/23/16 1551      Core Components/Risk Factors/Patient Goals Review   Personal Goals Review Other;Increase Strength and Stamina   Review Pt is interested in strength training. A request was sent to cardiologist. received clearance and will initiate weight lifiting on Friday 10/25/16   Expected Outcomes Pt will get stronger and build in lean muscle mass      ITP Comments:     ITP Comments    Row Name 09/04/16 0905           ITP Comments Dr. Fransico Him, Medical Director          Comments: Pt is making expected progress toward personal goals after completing 22 sessions. Recommend continued exercise and life style modification education including  stress management and relaxation techniques to decrease cardiac risk profile.

## 2016-10-30 ENCOUNTER — Ambulatory Visit (INDEPENDENT_AMBULATORY_CARE_PROVIDER_SITE_OTHER): Payer: BLUE CROSS/BLUE SHIELD | Admitting: Internal Medicine

## 2016-10-30 ENCOUNTER — Encounter: Payer: Self-pay | Admitting: Internal Medicine

## 2016-10-30 VITALS — BP 104/62 | HR 55 | Temp 98.0°F | Ht 69.5 in | Wt 195.0 lb

## 2016-10-30 DIAGNOSIS — B360 Pityriasis versicolor: Secondary | ICD-10-CM

## 2016-10-30 DIAGNOSIS — Z0000 Encounter for general adult medical examination without abnormal findings: Secondary | ICD-10-CM

## 2016-10-30 DIAGNOSIS — I2510 Atherosclerotic heart disease of native coronary artery without angina pectoris: Secondary | ICD-10-CM | POA: Insufficient documentation

## 2016-10-30 LAB — CBC WITH DIFFERENTIAL/PLATELET
BASOS PCT: 0.6 % (ref 0.0–3.0)
Basophils Absolute: 0 10*3/uL (ref 0.0–0.1)
EOS ABS: 0.1 10*3/uL (ref 0.0–0.7)
Eosinophils Relative: 1.5 % (ref 0.0–5.0)
HEMATOCRIT: 41.4 % (ref 39.0–52.0)
Hemoglobin: 13.5 g/dL (ref 13.0–17.0)
LYMPHS ABS: 2.7 10*3/uL (ref 0.7–4.0)
LYMPHS PCT: 43.1 % (ref 12.0–46.0)
MCHC: 32.6 g/dL (ref 30.0–36.0)
MCV: 87.8 fl (ref 78.0–100.0)
MONOS PCT: 10.2 % (ref 3.0–12.0)
Monocytes Absolute: 0.6 10*3/uL (ref 0.1–1.0)
NEUTROS ABS: 2.8 10*3/uL (ref 1.4–7.7)
Neutrophils Relative %: 44.6 % (ref 43.0–77.0)
PLATELETS: 328 10*3/uL (ref 150.0–400.0)
RBC: 4.72 Mil/uL (ref 4.22–5.81)
RDW: 12.9 % (ref 11.5–15.5)
WBC: 6.3 10*3/uL (ref 4.0–10.5)

## 2016-10-30 LAB — LIPID PANEL
CHOL/HDL RATIO: 3
Cholesterol: 99 mg/dL (ref 0–200)
HDL: 31.9 mg/dL — ABNORMAL LOW (ref >39.00)
LDL CALC: 48 mg/dL (ref 0–99)
NonHDL: 66.65
Triglycerides: 91 mg/dL (ref 0.0–149.0)
VLDL: 18.2 mg/dL (ref 0.0–40.0)

## 2016-10-30 LAB — COMPREHENSIVE METABOLIC PANEL
ALT: 27 U/L (ref 0–53)
AST: 24 U/L (ref 0–37)
Albumin: 4.2 g/dL (ref 3.5–5.2)
Alkaline Phosphatase: 94 U/L (ref 39–117)
BUN: 14 mg/dL (ref 6–23)
CALCIUM: 9.7 mg/dL (ref 8.4–10.5)
CHLORIDE: 105 meq/L (ref 96–112)
CO2: 30 meq/L (ref 19–32)
CREATININE: 1.14 mg/dL (ref 0.40–1.50)
GFR: 83.58 mL/min (ref >60.00)
GLUCOSE: 90 mg/dL (ref 70–99)
Potassium: 4.1 mEq/L (ref 3.5–5.1)
SODIUM: 142 meq/L (ref 135–145)
Total Bilirubin: 0.5 mg/dL (ref 0.2–1.2)
Total Protein: 7 g/dL (ref 6.0–8.3)

## 2016-10-30 LAB — T4, FREE: Free T4: 0.83 ng/dL (ref 0.60–1.60)

## 2016-10-30 MED ORDER — FLUCONAZOLE 150 MG PO TABS: 300.0000 mg | ORAL_TABLET | Freq: Once | ORAL | 3 refills | Status: AC

## 2016-10-30 NOTE — Assessment & Plan Note (Signed)
Healthy but recent MI Discussed fitness and lifestyle Colon due 4/18---may want to defer for about 6 months (due to brilinta) Prefers no flu shot or prevnar, etc

## 2016-10-30 NOTE — Assessment & Plan Note (Signed)
Will treat with intermittent fluconazole

## 2016-10-30 NOTE — Assessment & Plan Note (Signed)
Recent MI and angioplasty Doing well now Still in cardiac rehab

## 2016-10-30 NOTE — Progress Notes (Signed)
Pre visit review using our clinic review tool, if applicable. No additional management support is needed unless otherwise documented below in the visit note. 

## 2016-10-30 NOTE — Progress Notes (Signed)
Subjective:    Patient ID: James [name], male    DOB: 1956-09-10, 60 y.o.   MRN: [id number]  HPI Here with wife for physical Reviewed MI, etc Still at cardiac rehab--now started with weights Has lost 5#  No chest pain No SOB No dizziness or syncope  Has recurrence of chest rash Both topical and oral have helped  Current Outpatient Prescriptions on File Prior to Visit  Medication Sig Dispense Refill  . aspirin EC 81 MG tablet Take 81 mg by mouth daily.    Marland Kitchen atorvastatin (LIPITOR) 80 MG tablet Take 80 mg by mouth daily.    . Diclofenac Sodium (PENNSAID) 2 % SOLN Place 2 application onto the skin 2 (two) times daily. 112 g 3  . lisinopril (PRINIVIL,ZESTRIL) 2.5 MG tablet Take 2.5 mg by mouth daily.    . metoprolol tartrate (LOPRESSOR) 25 MG tablet Take 12.5 mg by mouth at bedtime.    . nitroGLYCERIN (NITROSTAT) 0.4 MG SL tablet Place 0.4 mg under the tongue every 5 (five) minutes as needed for chest pain.    . ticagrelor (BRILINTA) 90 MG TABS tablet Take by mouth 2 (two) times daily.     No current facility-administered medications on file prior to visit.     No Known Allergies  Past Medical History:  Diagnosis Date  . Colon polyps   . Hyperlipidemia   . MI (myocardial infarction)   . Tinea versicolor     Past Surgical History:  Procedure Laterality Date  . CORONARY STENT PLACEMENT     at [facility]  . jaw tumor Left   . mouth tumor    . TONSILLECTOMY  1962    Family History  Problem Relation Age of Onset  . Colon cancer Neg Hx   . Stomach cancer Neg Hx     Social History   Social History  . Marital status: Married    Spouse name: N/A  . Number of children: 2  . Years of education: N/A   Occupational History  . Training and development officer   Social History Main Topics  . Smoking status: Former Smoker    Quit date: 11/24/1978  . Smokeless tobacco: Never Used  . Alcohol use Yes     Comment: 1 to 2 beers a month  . Drug use: No    . Sexual activity: Not on file   Other Topics Concern  . Not on file   Social History Narrative   Former minor league shortstop   Review of Systems  Constitutional: Negative for fatigue.       Wears seat belt  HENT: Negative for dental problem, hearing loss and tinnitus.        Keeps up with dentist  Eyes: Negative for visual disturbance.       No diplopia or unilateral vision loss  Respiratory: Negative for cough, chest tightness and shortness of breath.   Cardiovascular: Negative for chest pain, palpitations and leg swelling.  Gastrointestinal: Negative for abdominal pain, blood in stool, constipation, nausea and vomiting.  Endocrine: Negative for polydipsia and polyuria.  Genitourinary: Negative for difficulty urinating, frequency and urgency.       No sexual problems  Musculoskeletal: Negative for arthralgias, back pain and joint swelling.  Skin: Positive for rash.       No suspicious lesions  Allergic/Immunologic: Negative for environmental allergies and immunocompromised state.  Neurological: Negative for dizziness, syncope, weakness, light-headedness and headaches.  Hematological: Negative for adenopathy. Does not bruise/bleed easily.  Psychiatric/Behavioral: Negative for dysphoric mood and sleep disturbance. The patient is not nervous/anxious.        Objective:   Physical Exam  Constitutional: He is oriented to person, place, and time. He appears well-developed and well-nourished. No distress.  HENT:  Head: Normocephalic and atraumatic.  Right Ear: External ear normal.  Left Ear: External ear normal.  Mouth/Throat: Oropharynx is clear and moist. No oropharyngeal exudate.  Eyes: Conjunctivae are normal. Pupils are equal, round, and reactive to light.  Neck: Normal range of motion. Neck supple. No thyromegaly present.  Cardiovascular: Normal rate, regular rhythm and normal heart sounds.  Exam reveals no gallop.   No murmur heard. Faint pedal pulses   Pulmonary/Chest: Effort normal and breath sounds normal. No respiratory distress. He has no wheezes. He has no rales.  Abdominal: Soft. There is no tenderness.  Musculoskeletal: He exhibits no edema or tenderness.  Lymphadenopathy:    He has no cervical adenopathy.  Neurological: He is alert and oriented to person, place, and time.  Skin:  Rash on trunk  Psychiatric: He has a normal mood and affect. His behavior is normal.          Assessment & Plan:

## 2016-10-31 ENCOUNTER — Encounter (HOSPITAL_COMMUNITY)
Admission: RE | Admit: 2016-10-31 | Discharge: 2016-10-31 | Disposition: A | Discharge status: Home / Self Care | Payer: BLUE CROSS/BLUE SHIELD | Source: Ambulatory Visit | Attending: Cardiology | Admitting: Cardiology

## 2016-10-31 DIAGNOSIS — I2102 ST elevation (STEMI) myocardial infarction involving left anterior descending coronary artery: Secondary | ICD-10-CM

## 2016-10-31 DIAGNOSIS — Z48812 Encounter for surgical aftercare following surgery on the circulatory system: Secondary | ICD-10-CM | POA: Diagnosis not present

## 2016-11-03 ENCOUNTER — Encounter (HOSPITAL_COMMUNITY)
Admission: RE | Admit: 2016-11-03 | Discharge: 2016-11-03 | Disposition: A | Discharge status: Home / Self Care | Payer: BLUE CROSS/BLUE SHIELD | Source: Ambulatory Visit | Attending: Cardiology | Admitting: Cardiology

## 2016-11-03 DIAGNOSIS — Z48812 Encounter for surgical aftercare following surgery on the circulatory system: Secondary | ICD-10-CM | POA: Diagnosis not present

## 2016-11-03 DIAGNOSIS — I2102 ST elevation (STEMI) myocardial infarction involving left anterior descending coronary artery: Secondary | ICD-10-CM

## 2016-11-05 ENCOUNTER — Encounter (HOSPITAL_COMMUNITY)
Admission: RE | Admit: 2016-11-05 | Discharge: 2016-11-05 | Disposition: A | Discharge status: Home / Self Care | Payer: BLUE CROSS/BLUE SHIELD | Source: Ambulatory Visit | Attending: Cardiology | Admitting: Cardiology

## 2016-11-05 DIAGNOSIS — Z48812 Encounter for surgical aftercare following surgery on the circulatory system: Secondary | ICD-10-CM | POA: Diagnosis not present

## 2016-11-05 DIAGNOSIS — I2102 ST elevation (STEMI) myocardial infarction involving left anterior descending coronary artery: Secondary | ICD-10-CM

## 2016-11-07 ENCOUNTER — Encounter (HOSPITAL_COMMUNITY)
Admission: RE | Admit: 2016-11-07 | Discharge: 2016-11-07 | Disposition: A | Discharge status: Home / Self Care | Payer: BLUE CROSS/BLUE SHIELD | Source: Ambulatory Visit | Attending: Cardiology | Admitting: Cardiology

## 2016-11-07 DIAGNOSIS — Z48812 Encounter for surgical aftercare following surgery on the circulatory system: Secondary | ICD-10-CM | POA: Diagnosis not present

## 2016-11-07 DIAGNOSIS — I2102 ST elevation (STEMI) myocardial infarction involving left anterior descending coronary artery: Secondary | ICD-10-CM

## 2016-11-10 ENCOUNTER — Encounter (HOSPITAL_COMMUNITY)
Admission: RE | Admit: 2016-11-10 | Discharge: 2016-11-10 | Disposition: A | Discharge status: Home / Self Care | Payer: BLUE CROSS/BLUE SHIELD | Source: Ambulatory Visit | Attending: Cardiology | Admitting: Cardiology

## 2016-11-10 DIAGNOSIS — I2102 ST elevation (STEMI) myocardial infarction involving left anterior descending coronary artery: Secondary | ICD-10-CM

## 2016-11-10 DIAGNOSIS — Z48812 Encounter for surgical aftercare following surgery on the circulatory system: Secondary | ICD-10-CM | POA: Diagnosis not present

## 2016-11-12 ENCOUNTER — Encounter (HOSPITAL_COMMUNITY)
Admission: RE | Admit: 2016-11-12 | Discharge: 2016-11-12 | Disposition: A | Discharge status: Home / Self Care | Payer: BLUE CROSS/BLUE SHIELD | Source: Ambulatory Visit | Attending: Cardiology | Admitting: Cardiology

## 2016-11-12 DIAGNOSIS — Z48812 Encounter for surgical aftercare following surgery on the circulatory system: Secondary | ICD-10-CM | POA: Diagnosis not present

## 2016-11-12 DIAGNOSIS — I2102 ST elevation (STEMI) myocardial infarction involving left anterior descending coronary artery: Secondary | ICD-10-CM

## 2016-11-14 ENCOUNTER — Encounter (HOSPITAL_COMMUNITY)
Admission: RE | Admit: 2016-11-14 | Discharge: 2016-11-14 | Disposition: A | Discharge status: Home / Self Care | Payer: BLUE CROSS/BLUE SHIELD | Source: Ambulatory Visit | Attending: Cardiology | Admitting: Cardiology

## 2016-11-14 DIAGNOSIS — Z48812 Encounter for surgical aftercare following surgery on the circulatory system: Secondary | ICD-10-CM | POA: Diagnosis not present

## 2016-11-14 DIAGNOSIS — I2102 ST elevation (STEMI) myocardial infarction involving left anterior descending coronary artery: Secondary | ICD-10-CM

## 2016-11-19 ENCOUNTER — Encounter (HOSPITAL_COMMUNITY)
Admission: RE | Admit: 2016-11-19 | Discharge: 2016-11-19 | Disposition: A | Discharge status: Home / Self Care | Payer: BLUE CROSS/BLUE SHIELD | Source: Ambulatory Visit | Attending: Cardiology | Admitting: Cardiology

## 2016-11-19 DIAGNOSIS — I2102 ST elevation (STEMI) myocardial infarction involving left anterior descending coronary artery: Secondary | ICD-10-CM

## 2016-11-19 DIAGNOSIS — Z48812 Encounter for surgical aftercare following surgery on the circulatory system: Secondary | ICD-10-CM | POA: Diagnosis not present

## 2016-11-21 ENCOUNTER — Encounter (HOSPITAL_COMMUNITY)
Admission: RE | Admit: 2016-11-21 | Discharge: 2016-11-21 | Disposition: A | Discharge status: Home / Self Care | Payer: BLUE CROSS/BLUE SHIELD | Source: Ambulatory Visit | Attending: Cardiology | Admitting: Cardiology

## 2016-11-21 DIAGNOSIS — I2102 ST elevation (STEMI) myocardial infarction involving left anterior descending coronary artery: Secondary | ICD-10-CM

## 2016-11-21 DIAGNOSIS — Z48812 Encounter for surgical aftercare following surgery on the circulatory system: Secondary | ICD-10-CM | POA: Diagnosis not present

## 2016-11-25 NOTE — Addendum Note (Signed)
Encounter addended by: Nathanyl Andujo D Kyrene Longan on: 11/25/2016 10:56 AM<BR>    Actions taken: Flowsheet data copied forward, Flowsheet accepted

## 2016-11-26 ENCOUNTER — Encounter (HOSPITAL_COMMUNITY)
Admission: RE | Admit: 2016-11-26 | Discharge: 2016-11-26 | Disposition: A | Discharge status: Home / Self Care | Payer: BLUE CROSS/BLUE SHIELD | Source: Ambulatory Visit | Attending: Cardiology | Admitting: Cardiology

## 2016-11-26 VITALS — Ht 70.0 in | Wt 197.8 lb

## 2016-11-26 DIAGNOSIS — I2102 ST elevation (STEMI) myocardial infarction involving left anterior descending coronary artery: Secondary | ICD-10-CM

## 2016-11-26 DIAGNOSIS — Z955 Presence of coronary angioplasty implant and graft: Secondary | ICD-10-CM | POA: Diagnosis not present

## 2016-11-26 DIAGNOSIS — Z48812 Encounter for surgical aftercare following surgery on the circulatory system: Secondary | ICD-10-CM | POA: Diagnosis not present

## 2016-11-26 DIAGNOSIS — I213 ST elevation (STEMI) myocardial infarction of unspecified site: Secondary | ICD-10-CM | POA: Diagnosis not present

## 2016-11-26 NOTE — Progress Notes (Signed)
Cardiac Individual Treatment Plan  Patient Details  Name: Javaun Dimperio MRN: [id number] Date of Birth: May 23, 1956 Referring Provider:   Flowsheet Row CARDIAC REHAB PHASE II ORIENTATION from 09/04/2016 in [place]  Referring Provider  Fransico Him, MD      Initial Encounter Date:  [place] PHASE II ORIENTATION from 09/04/2016 in [facility]  Date  09/04/16  Referring Provider  Fransico Him, MD      Visit Diagnosis: 07/29/16 ST elevation myocardial infarction involving left anterior descending (LAD) coronary artery (HCC)  Patient's Home Medications on Admission:  Current Outpatient Prescriptions:  .  aspirin EC 81 MG tablet, Take 81 mg by mouth daily., Disp: , Rfl:  .  atorvastatin (LIPITOR) 80 MG tablet, Take 80 mg by mouth daily., Disp: , Rfl:  .  Diclofenac Sodium (PENNSAID) 2 % SOLN, Place 2 application onto the skin 2 (two) times daily., Disp: 112 g, Rfl: 3 .  lisinopril (PRINIVIL,ZESTRIL) 2.5 MG tablet, Take 2.5 mg by mouth daily., Disp: , Rfl:  .  metoprolol tartrate (LOPRESSOR) 25 MG tablet, Take 12.5 mg by mouth at bedtime., Disp: , Rfl:  .  nitroGLYCERIN (NITROSTAT) 0.4 MG SL tablet, Place 0.4 mg under the tongue every 5 (five) minutes as needed for chest pain., Disp: , Rfl:  .  ticagrelor (BRILINTA) 90 MG TABS tablet, Take by mouth 2 (two) times daily., Disp: , Rfl:   Past Medical History: Past Medical History:  Diagnosis Date  . Colon polyps   . Hyperlipidemia   . MI (myocardial infarction)   . Tinea versicolor     Tobacco Use: History  Smoking Status  . Former Smoker  . Quit date: 11/24/1978  Smokeless Tobacco  . Never Used    Labs: Recent Review Flowsheet Data    Labs for ITP Cardiac and Pulmonary Rehab Latest Ref Rng & Units 11/05/2007 08/03/2012 08/04/2014 10/30/2016   Cholestrol 0 - 200 mg/dL 238(HH) 204(H) 206(H) 99   LDLCALC 0 - 99 mg/dL - - - 48   LDLDIRECT mg/dL 142.5  138.0 136.7 -   HDL >39.00 mg/dL 31.6(L) 45.70 30.10(L) 31.90(L)   Trlycerides 0.0 - 149.0 mg/dL 355(HH) 92.0 203.0(H) 91.0      Capillary Blood Glucose: No results found for: GLUCAP   Exercise Target Goals:    Exercise Program Goal: Individual exercise prescription set with THRR, safety & activity barriers. Participant demonstrates ability to understand and report RPE using BORG scale, to self-measure pulse accurately, and to acknowledge the importance of the exercise prescription.  Exercise Prescription Goal: Starting with aerobic activity 30 plus minutes a day, 3 days per week for initial exercise prescription. Provide home exercise prescription and guidelines that participant acknowledges understanding prior to discharge.  Activity Barriers & Risk Stratification:     Activity Barriers & Cardiac Risk Stratification - 09/04/16 0909      Activity Barriers & Cardiac Risk Stratification   Comments B knee pain   Cardiac Risk Stratification High      6 Minute Walk:     6 Minute Walk    Row Name 09/04/16 1145         6 Minute Walk   Phase Initial     Distance 1654 feet     Walk Time 6 minutes     # of Rest Breaks 0     MPH 3.13     METS 3.72     RPE 11  Perceived Dyspnea  0     VO2 Peak 13.03     Symptoms No     Resting HR 68 bpm     Resting BP 108/74     Max Ex. HR 89 bpm     Max Ex. BP 122/78     2 Minute Post BP 108/70        Initial Exercise Prescription:     Initial Exercise Prescription - 09/04/16 1200      Date of Initial Exercise RX and Referring Provider   Date 09/04/16   Referring Provider Fransico Him, MD     Treadmill   MPH 2.8   Grade 1   Minutes 10   METs 3.53     Bike   Level 1   Minutes 10   METs 3.1     NuStep   Level 3   Minutes 10   METs 2     Track   Laps 12   Minutes 10   METs 3.09     Prescription Details   Frequency (times per week) 3   Duration Progress to 45 minutes of aerobic exercise without  signs/symptoms of physical distress     Intensity   THRR 40-80% of Max Heartrate 63-126   Ratings of Perceived Exertion 11-13   Perceived Dyspnea 0-4     Progression   Progression Continue progressive overload as per policy without signs/symptoms or physical distress.     Resistance Training   Training Prescription Yes   Weight 2   Reps 10-12      Perform Capillary Blood Glucose checks as needed.  Exercise Prescription Changes:      Exercise Prescription Changes    Row Name 09/30/16 0900 10/28/16 0900 11/13/16 1400 11/25/16 1000 11/26/16 0900     Exercise Review   Progression _0      Response to Exercise   Blood Pressure (Admit) 122/80 104/68 104/64 104/70 104/70   Blood Pressure (Exercise) 134/70 160/80 138/70 162/82 162/82   Blood Pressure (Exit) 112/70 104/64 104/70 124/80 124/80   Heart Rate (Admit) 83 bpm 84 bpm 74 bpm 73 bpm 73 bpm   Heart Rate (Exercise) 125 bpm 118 bpm 136 bpm 131 bpm 131 bpm   Heart Rate (Exit) 89 bpm 84 bpm 73 bpm 86 bpm 86 bpm   Rating of Perceived Exertion (Exercise) _1 Symptoms  - none pt is doing weight column machine for 1 station at 10 minutes pt is doing weight column machine for 1 station at 10 minutes pt is doing weight column machine for 1 station at 10 minutes   Comments Reviewed HEP on 10/23 Reviewed HEP on 10/23 pt is doing cable column for additional weight training for 1 station  pt is doing cable column for additional weight training for 1 station  pt is doing cable column for additional weight training for 1 station    Duration Progress to 30 minutes of continuous aerobic without signs/symptoms of physical distress Progress to 30 minutes of continuous aerobic without signs/symptoms of physical distress Progress to 30 minutes of continuous aerobic without signs/symptoms of physical distress Progress to 30 minutes of continuous aerobic without signs/symptoms of physical distress Progress to 30 minutes of  continuous aerobic without signs/symptoms of physical distress   Intensity _2      Progression   Average METs 3.6 4.2 4.8 5 5  Resistance Training   Training Prescription _0    Weight 5lbs 5lbs 6lbs 8lbs 8lbs   Reps 10-12 10-12 10-12 10-12 10-12     Treadmill   MPH 3 3 3.4 3.4 3.4   Grade _1 Minutes _2 METs 4.12 4.12 5.48 5.48 5.48     Bike   Level 1 1.5  -  -  -   Minutes 10 10  -  -  -   METs 3.07 4.14  -  -  -     NuStep   Level _3 Minutes _4 METs 3.6 4.2 4.2 4.6 4.6     Track   Laps _5 -  -   Minutes _6 -  -   METs 3.09 3.09 3.09  -  -     Home Exercise Plan   Plans to continue exercise at [place] on 09/15/16. See progress note Home  Reviewed HEP on 09/15/16. See progress note Home  Reviewed HEP on 09/15/16. See progress note Home  Reviewed HEP on 09/15/16. See progress note Home  Reviewed HEP on 09/15/16. See progress note   Frequency Add 3 additional days to program exercise sessions. Add 3 additional days to program exercise sessions. Add 3 additional days to program exercise sessions. Add 3 additional days to program exercise sessions. Add 3 additional days to program exercise sessions.      Exercise Comments:      Exercise Comments    Row Name 09/30/16 0957 10/24/16 0811 10/28/16 0953 11/13/16 1500     Exercise Comments Reviewed METs and goals. Pt is tolerating exercise well; will continue to monitor exercise progression. Reviewed METs and goals. Pt is tolerating exercise well; will continue to monitor exercise progression. Reviewed METs and goals. Pt is tolerating exercise well; will continue to monitor exercise progression. Reviewed METs and goals. Pt is tolerating exercise well; will continue to monitor exercise progression.       Discharge Exercise Prescription (Final Exercise Prescription  Changes):     Exercise Prescription Changes - 11/26/16 0900      Exercise Review   Progression Yes     Response to Exercise   Blood Pressure (Admit) 104/70   Blood Pressure (Exercise) 162/82   Blood Pressure (Exit) 124/80   Heart Rate (Admit) 73 bpm   Heart Rate (Exercise) 131 bpm   Heart Rate (Exit) 86 bpm   Rating of Perceived Exertion (Exercise) 12   Symptoms pt is doing weight column machine for 1 station at 10 minutes   Comments pt is doing cable column for additional weight training for 1 station    Duration Progress to 30 minutes of continuous aerobic without signs/symptoms of physical distress   Intensity THRR unchanged     Progression   Average METs 5     Resistance Training   Training Prescription Yes   Weight 8lbs   Reps 10-12     Treadmill   MPH 3.4   Grade 4   Minutes 10   METs 5.48     NuStep   Level 5   Minutes 10   METs 4.6     Home Exercise Plan   Plans to continue exercise at Home  Reviewed HEP on 09/15/16. See progress note   Frequency Add 3 additional days to program  exercise sessions.      Nutrition:  Target Goals: Understanding of nutrition guidelines, daily intake of sodium <157m, cholesterol <2075m calories 30% from fat and 7% or less from saturated fats, daily to have 5 or more servings of fruits and vegetables.  Biometrics:     Pre Biometrics - 09/04/16 1237      Pre Biometrics   Waist Circumference 41 inches   Hip Circumference 43 inches   Waist to Hip Ratio 0.95 %       Nutrition Therapy Plan and Nutrition Goals:     Nutrition Therapy & Goals - 09/05/16 1217      Nutrition Therapy   Diet Therapeutic Lifestyle Changes     Personal Nutrition Goals   Personal Goal #1 1-2 lb wt loss/week to a wt loss goal of 6-24 lb at graduation from [facility] and counsel regarding individualized specific dietary modifications aiming towards targeted core components such  as weight, hypertension, lipid management, diabetes, heart failure and other comorbidities.   Expected Outcomes Short Term Goal: Understand basic principles of dietary content, such as calories, fat, sodium, cholesterol and nutrients.;Long Term Goal: Adherence to prescribed nutrition plan.      Nutrition Discharge: Nutrition Scores:     Nutrition Assessments - 09/05/16 1211      MEDFICTS Scores   Pre Score 38      Nutrition Goals Re-Evaluation:   Psychosocial: Target Goals: Acknowledge presence or absence of depression, maximize coping skills, provide positive support system. Participant is able to verbalize types and ability to use techniques and skills needed for reducing stress and depression.  Initial Review & Psychosocial Screening:     Initial Psych Review & Screening - 09/05/16 0854      Family Dynamics   Good Support System? Yes   Comments Wife accompained pt to orientation.     Barriers   Psychosocial barriers to participate in program There are no identifiable barriers or psychosocial needs.      Quality of Life Scores:     Quality of Life - 09/04/16 1146      Quality of Life Scores   Health/Function Pre 25.6 %   Socioeconomic Pre 29.69 %   Psych/Spiritual Pre 30 %   Family Pre 27.6 %   GLOBAL Pre 27.7 %      PHQ-9: Recent Review Flowsheet Data    Depression screen PHQ 2/9 09/08/2016   Decreased Interest 0   Down, Depressed, Hopeless 0   PHQ - 2 Score 0      Psychosocial Evaluation and Intervention:     Psychosocial Evaluation - 09/30/16 0859      Psychosocial Evaluation & Interventions   Interventions Encouraged to exercise with the program and follow exercise prescription   Comments no psychosocial needs identified, no inteventions necessary. pt is walkng and stretching on his own at home.  He reports he has also improved his diet.    Continued Psychosocial Services Needed No      Psychosocial Re-Evaluation:     Psychosocial  Re-Evaluation    [place] 10/27/16 0744 11/25/16 0810           Psychosocial Re-Evaluation   Interventions Encouraged to attend Cardiac Rehabilitation for the exercise Encouraged to attend Cardiac Rehabilitation for the exercise      Comments no psychosocial needs identified, no interventions necessary . pt is walking and stretching at home on his own.  pt is looking forward  to being able to bowl and play golf again soon. no psychosocial needs identified, no interventions necessary . pt is walking and stretching at home on his own.        Continued Psychosocial Services Needed No No         Vocational Rehabilitation: Provide vocational rehab assistance to qualifying candidates.   Vocational Rehab Evaluation & Intervention:     Vocational Rehab - 09/05/16 0855      Initial Vocational Rehab Evaluation & Intervention   Assessment shows need for Vocational Rehabilitation Yes  pt has returned back to work      Education: Education Goals: Education classes will be provided on a weekly basis, covering required topics. Participant will state understanding/return demonstration of topics presented.  Learning Barriers/Preferences:     Learning Barriers/Preferences - 09/04/16 1438      Learning Barriers/Preferences   Learning Barriers None;Sight      Education Topics: Count Your Pulse:  -Group instruction provided by verbal instruction, demonstration, patient participation and written materials to support subject.  Instructors address importance of being able to find your pulse and how to count your pulse when at home without a heart monitor.  Patients get hands on experience counting their pulse with staff help and individually. Flowsheet Row CARDIAC REHAB PHASE II EXERCISE from 11/21/2016 in [place] [facility]  Date  09/26/16  Educator  Andi Hence, RN  Instruction Review Code  2- meets goals/outcomes      Heart Attack, Angina, and Risk Factor  Modification:  -Group instruction provided by verbal instruction, video, and written materials to support subject.  Instructors address signs and symptoms of angina and heart attacks.    Also discuss risk factors for heart disease and how to make changes to improve heart health risk factors. Flowsheet Row CARDIAC REHAB PHASE II EXERCISE from 11/21/2016 in [place] [place]  Date  10/31/16  Instruction Review Code  2- meets goals/outcomes      Functional Fitness:  -Group instruction provided by verbal instruction, demonstration, patient participation, and written materials to support subject.  Instructors address safety measures for doing things around the house.  Discuss how to get up and down off the floor, how to pick things up properly, how to safely get out of a chair without assistance, and balance training. Flowsheet Row CARDIAC REHAB PHASE II EXERCISE from 11/21/2016 in [facility]  Date  11/14/16  Educator  Cleda Mccreedy, [place]  Instruction Review Code  R- Review/reinforce      Meditation and Mindfulness:  -Group instruction provided by verbal instruction, patient participation, and written materials to support subject.  Instructor addresses importance of mindfulness and meditation practice to help reduce stress and improve awareness.  Instructor also leads participants through a meditation exercise.  Flowsheet Row CARDIAC REHAB PHASE II EXERCISE from 11/21/2016 in [facility]  Date  11/19/16  Instruction Review Code  2- meets goals/outcomes      Stretching for Flexibility and Mobility:  -Group instruction provided by verbal instruction, patient participation, and written materials to support subject.  Instructors lead participants through series of stretches that are designed to increase flexibility thus improving mobility.  These stretches are additional exercise for major muscle groups that are  typically performed during regular warm up and cool down. Flowsheet Row CARDIAC REHAB PHASE II EXERCISE from 11/21/2016 in [facility]  Date  10/29/16  Instruction Review Code  2- meets goals/outcomes      Hands Only CPR Anytime:  -Group instruction provided by verbal instruction, video, patient participation and written materials to support subject.  Instructors co-teach with [facility] video for hands only CPR.  Participants get hands on experience with mannequins.   Nutrition I class: Heart Healthy Eating:  -Group instruction provided by PowerPoint slides, verbal discussion, and written materials to support subject matter. The instructor gives an explanation and review of the Therapeutic Lifestyle Changes diet recommendations, which includes a discussion on lipid goals, dietary fat, sodium, fiber, plant stanol/sterol esters, sugar, and the components of a well-balanced, healthy diet. Flowsheet Row CARDIAC REHAB PHASE II EXERCISE from 11/21/2016 in [facility]  Date  09/30/16  Educator  RD  Instruction Review Code  2- meets goals/outcomes      Nutrition II class: Lifestyle Skills:  -Group instruction provided by PowerPoint slides, verbal discussion, and written materials to support subject matter. The instructor gives an explanation and review of label reading, grocery shopping for heart health, heart healthy recipe modifications, and ways to make healthier choices when eating out. Flowsheet Row CARDIAC REHAB PHASE II EXERCISE from 11/21/2016 in [place]  Date  10/07/16  Educator  RD  Instruction Review Code  2- meets goals/outcomes      Diabetes Question & Answer:  -Group instruction provided by PowerPoint slides, verbal discussion, and written materials to support subject matter. The instructor gives an explanation and review of diabetes co-morbidities, pre- and post-prandial blood glucose  goals, pre-exercise blood glucose goals, signs, symptoms, and treatment of hypoglycemia and hyperglycemia, and foot care basics. Flowsheet Row CARDIAC REHAB PHASE II EXERCISE from 11/21/2016 in [facility]  Date  11/07/16  Educator  RD  Instruction Review Code  2- meets goals/outcomes      Diabetes Blitz:  -Group instruction provided by PowerPoint slides, verbal discussion, and written materials to support subject matter. The instructor gives an explanation and review of the physiology behind type 1 and type 2 diabetes, diabetes medications and rational behind using different medications, pre- and post-prandial blood glucose recommendations and Hemoglobin A1c goals, diabetes diet, and exercise including blood glucose guidelines for exercising safely.    Portion Distortion:  -Group instruction provided by PowerPoint slides, verbal discussion, written materials, and food models to support subject matter. The instructor gives an explanation of serving size versus portion size, changes in portions sizes over the last 20 years, and what consists of a serving from each food group. Flowsheet Row CARDIAC REHAB PHASE II EXERCISE from 11/21/2016 in [facility]  Date  10/22/16 [Holiday Eating Survival Tips]  Educator  RD  Instruction Review Code  2- meets goals/outcomes      Stress Management:  -Group instruction provided by verbal instruction, video, and written materials to support subject matter.  Instructors review role of stress in heart disease and how to cope with stress positively.   Flowsheet Row CARDIAC REHAB PHASE II EXERCISE from 11/21/2016 in [place]  Date  10/08/16  Instruction Review Code  2- meets goals/outcomes      Exercising on Your Own:  -Group instruction provided by verbal instruction, power point, and written materials to support subject.  Instructors discuss benefits of  exercise, components of exercise, frequency and intensity of exercise, and end points for exercise.  Also discuss use of nitroglycerin and activating EMS.  Review options of places to  exercise outside of rehab.  Review guidelines for sex with heart disease. Flowsheet Row CARDIAC REHAB PHASE II EXERCISE from 11/21/2016 in [facility] [facility]  Date  10/10/16  Instruction Review Code  2- meets goals/outcomes      Cardiac Drugs I:  -Group instruction provided by verbal instruction and written materials to support subject.  Instructor reviews cardiac drug classes: antiplatelets, anticoagulants, beta blockers, and statins.  Instructor discusses reasons, side effects, and lifestyle considerations for each drug class. Flowsheet Row CARDIAC REHAB PHASE II EXERCISE from 11/21/2016 in [place]  Date  11/05/16  Instruction Review Code  2- meets goals/outcomes      Cardiac Drugs II:  -Group instruction provided by verbal instruction and written materials to support subject.  Instructor reviews cardiac drug classes: angiotensin converting enzyme inhibitors (ACE-I), angiotensin II receptor blockers (ARBs), nitrates, and calcium channel blockers.  Instructor discusses reasons, side effects, and lifestyle considerations for each drug class. Flowsheet Row CARDIAC REHAB PHASE II EXERCISE from 11/21/2016 in [facility]  Date  10/01/16  Instruction Review Code  2- meets goals/outcomes      Anatomy and Physiology of the Circulatory System:  -Group instruction provided by verbal instruction, video, and written materials to support subject.  Reviews functional anatomy of heart, how it relates to various diagnoses, and what role the heart plays in the overall system. [place] PHASE II EXERCISE from 11/21/2016 in [place]  Date  11/12/16  Instruction Review Code  2- meets  goals/outcomes      Knowledge Questionnaire Score:     Knowledge Questionnaire Score - 09/04/16 1147      Knowledge Questionnaire Score   Pre Score 20/24      Core Components/Risk Factors/Patient Goals at Admission:     Personal Goals and Risk Factors at Admission - 09/04/16 0909      Core Components/Risk Factors/Patient Goals on Admission    Weight Management Yes;Weight Loss   Intervention Weight Management: Develop a combined nutrition and exercise program designed to reach desired caloric intake, while maintaining appropriate intake of nutrient and fiber, sodium and fats, and appropriate energy expenditure required for the weight goal.;Weight Management: Provide education and appropriate resources to help participant work on and attain dietary goals.;Weight Management/Obesity: Establish reasonable short term and long term weight goals.;Obesity: Provide education and appropriate resources to help participant work on and attain dietary goals.   Expected Outcomes Short Term: Continue to assess and modify interventions until short term weight is achieved;Weight Maintenance: Understanding of the daily nutrition guidelines, which includes 25-35% calories from fat, 7% or less cal from saturated fats, less than '200mg'$  cholesterol, less than 1.5gm of sodium, & 5 or more servings of fruits and vegetables daily;Long Term: Adherence to nutrition and physical activity/exercise program aimed toward attainment of established weight goal;Understanding recommendations for meals to include 15-35% energy as protein, 25-35% energy from fat, 35-60% energy from carbohydrates, less than '200mg'$  of dietary cholesterol, 20-35 gm of total fiber daily;Weight Loss: Understanding of general recommendations for a balanced deficit meal plan, which promotes 1-2 lb weight loss per week and includes a negative energy balance of [phone] kcal/d;Understanding of distribution of calorie intake throughout the day with the  consumption of 4-5 meals/snacks   Sedentary Yes   Intervention Provide advice, education, support and counseling about physical activity/exercise needs.;Develop an individualized exercise prescription for aerobic and resistive training based on initial evaluation findings,  risk stratification, comorbidities and participant's personal goals.   Expected Outcomes Achievement of increased cardiorespiratory fitness and enhanced flexibility, muscular endurance and strength shown through measurements of functional capacity and personal statement of participant.   Increase Strength and Stamina Yes   Intervention Provide advice, education, support and counseling about physical activity/exercise needs.;Develop an individualized exercise prescription for aerobic and resistive training based on initial evaluation findings, risk stratification, comorbidities and participant's personal goals.   Expected Outcomes Achievement of increased cardiorespiratory fitness and enhanced flexibility, muscular endurance and strength shown through measurements of functional capacity and personal statement of participant.   Lipids Yes   Intervention Provide education and support for participant on nutrition & aerobic/resistive exercise along with prescribed medications to achieve LDL <60m, HDL >433m   Expected Outcomes Short Term: Participant states understanding of desired cholesterol values and is compliant with medications prescribed. Participant is following exercise prescription and nutrition guidelines.;Long Term: Cholesterol controlled with medications as prescribed, with individualized exercise RX and with personalized nutrition plan. Value goals: LDL < 7052mHDL > 40 mg.   Personal Goal Other Yes   Personal Goal Improve strength and get back resistance training. Improve breathing quality   Intervention Provide education on diaphragmatic breathing. Provide exercise guidelines to improve cardiovascular fitness   Expected  Outcomes Pt will be able return to resistance training to improve strength and be able to breath better.      Core Components/Risk Factors/Patient Goals Review:      Goals and Risk Factor Review    Row Name 09/30/16 0957 10/23/16 1551 11/13/16 1500         Core Components/Risk Factors/Patient Goals Review   Personal Goals Review Other;Increase Strength and Stamina Other;Increase Strength and Stamina  -     Review Pt has increase size in handheld weights and feels as though getting stronger. Pt breathing has also improved Pt is interested in strength training. A request was sent to cardiologist. received clearance and will initiate weight lifiting on Friday 10/25/16 Pt is active in weight lifting in cardiac rehab. Pt increased weight load from 40lbs to 50lbs.  Pt has noticed an increase in muscle mass and breathing capacity      Expected Outcomes Pt will continue to improve in breathing/aerobic capacity and build in strength. Pt will get stronger and build in lean muscle mass Pt will continue to get stronger and increase lean muscle mass and breathing capacity.        Core Components/Risk Factors/Patient Goals at Discharge (Final Review):      Goals and Risk Factor Review - 11/13/16 1500      Core Components/Risk Factors/Patient Goals Review   Review Pt is active in weight lifting in cardiac rehab. Pt increased weight load from 40lbs to 50lbs.  Pt has noticed an increase in muscle mass and breathing capacity    Expected Outcomes Pt will continue to get stronger and increase lean muscle mass and breathing capacity.      ITP Comments:     ITP Comments    Row Name 09/04/16 0905 11/21/16 0816         ITP Comments Dr. TraFransico Himedical Director Attended CPR education class, Met outcomes/goals          Comments: Pt is making expected progress toward personal goals after completing 31sessions. Recommend continued exercise and life style modification education including  stress  management and relaxation techniques to decrease cardiac risk profile.

## 2016-11-27 DIAGNOSIS — I519 Heart disease, unspecified: Secondary | ICD-10-CM | POA: Diagnosis not present

## 2016-11-27 DIAGNOSIS — I340 Nonrheumatic mitral (valve) insufficiency: Secondary | ICD-10-CM | POA: Diagnosis not present

## 2016-11-28 ENCOUNTER — Encounter (HOSPITAL_COMMUNITY)
Admission: RE | Admit: 2016-11-28 | Discharge: 2016-11-28 | Disposition: A | Discharge status: Home / Self Care | Payer: BLUE CROSS/BLUE SHIELD | Source: Ambulatory Visit | Attending: Cardiology | Admitting: Cardiology

## 2016-11-28 DIAGNOSIS — Z48812 Encounter for surgical aftercare following surgery on the circulatory system: Secondary | ICD-10-CM | POA: Diagnosis not present

## 2016-11-28 DIAGNOSIS — I2102 ST elevation (STEMI) myocardial infarction involving left anterior descending coronary artery: Secondary | ICD-10-CM

## 2016-11-28 DIAGNOSIS — Z955 Presence of coronary angioplasty implant and graft: Secondary | ICD-10-CM | POA: Diagnosis not present

## 2016-11-28 DIAGNOSIS — I213 ST elevation (STEMI) myocardial infarction of unspecified site: Secondary | ICD-10-CM | POA: Diagnosis not present

## 2016-12-01 ENCOUNTER — Encounter (HOSPITAL_COMMUNITY)
Admission: RE | Admit: 2016-12-01 | Discharge: 2016-12-01 | Disposition: A | Discharge status: Home / Self Care | Payer: BLUE CROSS/BLUE SHIELD | Source: Ambulatory Visit | Attending: Cardiology | Admitting: Cardiology

## 2016-12-01 DIAGNOSIS — I2102 ST elevation (STEMI) myocardial infarction involving left anterior descending coronary artery: Secondary | ICD-10-CM

## 2016-12-01 DIAGNOSIS — Z955 Presence of coronary angioplasty implant and graft: Secondary | ICD-10-CM | POA: Diagnosis not present

## 2016-12-01 DIAGNOSIS — I213 ST elevation (STEMI) myocardial infarction of unspecified site: Secondary | ICD-10-CM | POA: Diagnosis not present

## 2016-12-01 DIAGNOSIS — Z48812 Encounter for surgical aftercare following surgery on the circulatory system: Secondary | ICD-10-CM | POA: Diagnosis not present

## 2016-12-03 ENCOUNTER — Encounter (HOSPITAL_COMMUNITY)
Admission: RE | Admit: 2016-12-03 | Discharge: 2016-12-03 | Disposition: A | Discharge status: Home / Self Care | Payer: BLUE CROSS/BLUE SHIELD | Source: Ambulatory Visit | Attending: Cardiology | Admitting: Cardiology

## 2016-12-03 ENCOUNTER — Encounter (HOSPITAL_COMMUNITY): Payer: Self-pay

## 2016-12-03 DIAGNOSIS — Z48812 Encounter for surgical aftercare following surgery on the circulatory system: Secondary | ICD-10-CM | POA: Diagnosis not present

## 2016-12-03 DIAGNOSIS — I2102 ST elevation (STEMI) myocardial infarction involving left anterior descending coronary artery: Secondary | ICD-10-CM

## 2016-12-03 DIAGNOSIS — Z955 Presence of coronary angioplasty implant and graft: Secondary | ICD-10-CM | POA: Diagnosis not present

## 2016-12-03 DIAGNOSIS — I213 ST elevation (STEMI) myocardial infarction of unspecified site: Secondary | ICD-10-CM | POA: Diagnosis not present

## 2016-12-03 NOTE — Progress Notes (Addendum)
Cardiac Individual Treatment Plan  Patient Details  Name: James [name] MRN: [id number] Date of Birth: 1956/06/13 Referring Provider:   Flowsheet Row CARDIAC REHAB PHASE II ORIENTATION from 09/04/2016 in [facility]  Referring Provider  Fransico Him, MD      Initial Encounter Date:  [place] PHASE II ORIENTATION from 09/04/2016 in [facility]  Date  09/04/16  Referring Provider  Fransico Him, MD      Visit Diagnosis: 07/29/16 ST elevation myocardial infarction involving left anterior descending (LAD) coronary artery (HCC)  Patient's Home Medications on Admission:  Current Outpatient Prescriptions:  .  aspirin EC 81 MG tablet, Take 81 mg by mouth daily., Disp: , Rfl:  .  atorvastatin (LIPITOR) 80 MG tablet, Take 80 mg by mouth daily., Disp: , Rfl:  .  Diclofenac Sodium (PENNSAID) 2 % SOLN, Place 2 application onto the skin 2 (two) times daily., Disp: 112 g, Rfl: 3 .  lisinopril (PRINIVIL,ZESTRIL) 2.5 MG tablet, Take 2.5 mg by mouth daily., Disp: , Rfl:  .  metoprolol tartrate (LOPRESSOR) 25 MG tablet, Take 12.5 mg by mouth at bedtime., Disp: , Rfl:  .  nitroGLYCERIN (NITROSTAT) 0.4 MG SL tablet, Place 0.4 mg under the tongue every 5 (five) minutes as needed for chest pain., Disp: , Rfl:  .  ticagrelor (BRILINTA) 90 MG TABS tablet, Take by mouth 2 (two) times daily., Disp: , Rfl:   Past Medical History: Past Medical History:  Diagnosis Date  . Colon polyps   . Hyperlipidemia   . MI (myocardial infarction)   . Tinea versicolor     Tobacco Use: History  Smoking Status  . Former Smoker  . Quit date: 11/24/1978  Smokeless Tobacco  . Never Used    Labs: Recent Review Flowsheet Data    Labs for ITP Cardiac and Pulmonary Rehab Latest Ref Rng & Units 11/05/2007 08/03/2012 08/04/2014 10/30/2016   Cholestrol 0 - 200 mg/dL 238(HH) 204(H) 206(H) 99   LDLCALC 0 - 99 mg/dL - - - 48   LDLDIRECT mg/dL 142.5  138.0 136.7 -   HDL >39.00 mg/dL 31.6(L) 45.70 30.10(L) 31.90(L)   Trlycerides 0.0 - 149.0 mg/dL 355(HH) 92.0 203.0(H) 91.0      Capillary Blood Glucose: No results found for: GLUCAP   Exercise Target Goals:    Exercise Program Goal: Individual exercise prescription set with THRR, safety & activity barriers. Participant demonstrates ability to understand and report RPE using BORG scale, to self-measure pulse accurately, and to acknowledge the importance of the exercise prescription.  Exercise Prescription Goal: Starting with aerobic activity 30 plus minutes a day, 3 days per week for initial exercise prescription. Provide home exercise prescription and guidelines that participant acknowledges understanding prior to discharge.  Activity Barriers & Risk Stratification:     Activity Barriers & Cardiac Risk Stratification - 09/04/16 0909      Activity Barriers & Cardiac Risk Stratification   Comments B knee pain   Cardiac Risk Stratification High      6 Minute Walk:     6 Minute Walk    Row Name 09/04/16 1145 11/26/16 1019       6 Minute Walk   Phase Initial Discharge    Distance 1654 feet 2200 feet    Distance % Change  - 33.01 %    Walk Time 6 minutes 6 minutes    # of Rest Breaks 0 0    MPH 3.13 4.2  METS 3.72 5.3    RPE 11 12    Perceived Dyspnea  0 0    VO2 Peak 13.03 18.56    Symptoms No No    Resting HR 68 bpm 73 bpm    Resting BP 108/74 100/64    Max Ex. HR 89 bpm 127 bpm    Max Ex. BP 122/78 146/78    2 Minute Post BP 108/70 104/70       Initial Exercise Prescription:     Initial Exercise Prescription - 09/04/16 1200      Date of Initial Exercise RX and Referring Provider   Date 09/04/16   Referring Provider Fransico Him, MD     Treadmill   MPH 2.8   Grade 1   Minutes 10   METs 3.53     Bike   Level 1   Minutes 10   METs 3.1     NuStep   Level 3   Minutes 10   METs 2     Track   Laps 12   Minutes 10   METs 3.09      Prescription Details   Frequency (times per week) 3   Duration Progress to 45 minutes of aerobic exercise without signs/symptoms of physical distress     Intensity   THRR 40-80% of Max Heartrate 63-126   Ratings of Perceived Exertion 11-13   Perceived Dyspnea 0-4     Progression   Progression Continue progressive overload as per policy without signs/symptoms or physical distress.     Resistance Training   Training Prescription Yes   Weight 2   Reps 10-12      Perform Capillary Blood Glucose checks as needed.  Exercise Prescription Changes:     Exercise Prescription Changes    Row Name 09/30/16 0900 10/28/16 0900 11/13/16 1400 11/25/16 1000 11/26/16 0900     Exercise Review   Progression _0      Response to Exercise   Blood Pressure (Admit) 122/80 104/68 104/64 104/70 104/70   Blood Pressure (Exercise) 134/70 160/80 138/70 162/82 162/82   Blood Pressure (Exit) 112/70 104/64 104/70 124/80 124/80   Heart Rate (Admit) 83 bpm 84 bpm 74 bpm 73 bpm 73 bpm   Heart Rate (Exercise) 125 bpm 118 bpm 136 bpm 131 bpm 131 bpm   Heart Rate (Exit) 89 bpm 84 bpm 73 bpm 86 bpm 86 bpm   Rating of Perceived Exertion (Exercise) _1 Symptoms  - none pt is doing weight column machine for 1 station at 10 minutes pt is doing weight column machine for 1 station at 10 minutes pt is doing weight column machine for 1 station at 10 minutes   Comments Reviewed HEP on 10/23 Reviewed HEP on 10/23 pt is doing cable column for additional weight training for 1 station  pt is doing cable column for additional weight training for 1 station  pt is doing cable column for additional weight training for 1 station    Duration Progress to 30 minutes of continuous aerobic without signs/symptoms of physical distress Progress to 30 minutes of continuous aerobic without signs/symptoms of physical distress Progress to 30 minutes of continuous aerobic without signs/symptoms of physical distress  Progress to 30 minutes of continuous aerobic without signs/symptoms of physical distress Progress to 30 minutes of continuous aerobic without signs/symptoms of physical distress   Intensity _2      Progression  Average METs 3.6 4.2 4._0 Resistance Training   Training Prescription _1    Weight 5lbs 5lbs 6lbs 8lbs 8lbs   Reps 10-12 10-12 10-12 10-12 10-12     Treadmill   MPH 3 3 3.4 3.4 3.4   Grade _2 Minutes _3 METs 4.12 4.12 5.48 5.48 5.48     Bike   Level 1 1.5  -  -  -   Minutes 10 10  -  -  -   METs 3.07 4.14  -  -  -     NuStep   Level _4 Minutes _5 METs 3.6 4.2 4.2 4.6 4.6     Track   Laps _6 -  -   Minutes _7 -  -   METs 3.09 3.09 3.09  -  -     Home Exercise Plan   Plans to continue exercise at [place] on 09/15/16. See progress note Home  Reviewed HEP on 09/15/16. See progress note Home  Reviewed HEP on 09/15/16. See progress note Home  Reviewed HEP on 09/15/16. See progress note Home  Reviewed HEP on 09/15/16. See progress note   Frequency Add 3 additional days to program exercise sessions. Add 3 additional days to program exercise sessions. Add 3 additional days to program exercise sessions. Add 3 additional days to program exercise sessions. Add 3 additional days to program exercise sessions.      Exercise Comments:     Exercise Comments    Row Name 09/30/16 0957 10/24/16 0811 10/28/16 0953 11/13/16 1500     Exercise Comments Reviewed METs and goals. Pt is tolerating exercise well; will continue to monitor exercise progression. Reviewed METs and goals. Pt is tolerating exercise well; will continue to monitor exercise progression. Reviewed METs and goals. Pt is tolerating exercise well; will continue to monitor exercise progression. Reviewed METs and goals. Pt is tolerating exercise well; will  continue to monitor exercise progression.       Discharge Exercise Prescription (Final Exercise Prescription Changes):     Exercise Prescription Changes - 11/26/16 0900      Exercise Review   Progression Yes     Response to Exercise   Blood Pressure (Admit) 104/70   Blood Pressure (Exercise) 162/82   Blood Pressure (Exit) 124/80   Heart Rate (Admit) 73 bpm   Heart Rate (Exercise) 131 bpm   Heart Rate (Exit) 86 bpm   Rating of Perceived Exertion (Exercise) 12   Symptoms pt is doing weight column machine for 1 station at 10 minutes   Comments pt is doing cable column for additional weight training for 1 station    Duration Progress to 30 minutes of continuous aerobic without signs/symptoms of physical distress   Intensity THRR unchanged     Progression   Average METs 5     Resistance Training   Training Prescription Yes   Weight 8lbs   Reps 10-12     Treadmill   MPH 3.4   Grade 4   Minutes 10   METs 5.48     NuStep   Level 5   Minutes 10   METs 4.6     Home Exercise Plan   Plans to continue exercise at Home  Reviewed HEP on 09/15/16. See progress  note   Frequency Add 3 additional days to program exercise sessions.      Nutrition:  Target Goals: Understanding of nutrition guidelines, daily intake of sodium <1576m, cholesterol <204m calories 30% from fat and 7% or less from saturated fats, daily to have 5 or more servings of fruits and vegetables.  Biometrics:     Pre Biometrics - 09/04/16 1237      Pre Biometrics   Waist Circumference 41 inches   Hip Circumference 43 inches   Waist to Hip Ratio 0.95 %         Post Biometrics - 11/26/16 1021       Post  Biometrics   Height _0  (1.778 m)   Weight 197 lb 12 oz (89.7 kg)   Waist Circumference 39 inches   Hip Circumference 42.5 inches   Waist to Hip Ratio 0.92 %   BMI (Calculated) 28.4   Triceps Skinfold 20 mm   % Body Fat 28.1 %   Grip Strength 61 kg   Flexibility 15 in   Single Leg Stand  30 seconds      Nutrition Therapy Plan and Nutrition Goals:     Nutrition Therapy & Goals - 09/05/16 1217      Nutrition Therapy   Diet Therapeutic Lifestyle Changes     Personal Nutrition Goals   Personal Goal #1 1-2 lb wt loss/week to a wt loss goal of 6-24 lb [facility] regarding individualized specific dietary modifications aiming towards targeted core components such as weight, hypertension, lipid management, diabetes, heart failure and other comorbidities.   Expected Outcomes Short Term Goal: Understand basic principles of dietary content, such as calories, fat, sodium, cholesterol and nutrients.;Long Term Goal: Adherence to prescribed nutrition plan.      Nutrition Discharge: Nutrition Scores:     Nutrition Assessments - 09/05/16 1211      MEDFICTS Scores   Pre Score 38      Nutrition Goals Re-Evaluation:   Psychosocial: Target Goals: Acknowledge presence or absence of depression, maximize coping skills, provide positive support system. Participant is able to verbalize types and ability to use techniques and skills needed for reducing stress and depression.  Initial Review & Psychosocial Screening:     Initial Psych Review & Screening - 09/05/16 0854      Family Dynamics   Good Support System? Yes   Comments Wife accompained pt to orientation.     Barriers   Psychosocial barriers to participate in program There are no identifiable barriers or psychosocial needs.      Quality of Life Scores:     Quality of Life - 11/28/16 1645      Quality of Life Scores   Health/Function Pre 25.6 %   Health/Function Post 27.3 %   Health/Function % Change 6.64 %   Socioeconomic Pre 29.69 %   Socioeconomic Post 25.71 %   Socioeconomic % Change  -13.41 %   Psych/Spiritual Pre 30 %   Psych/Spiritual Post 28.79 %   Psych/Spiritual % Change -4.03 %   Family Pre 27.6 %   Family  Post 25.5 %   Family % Change -7.61 %   GLOBAL Pre 27.7 %   GLOBAL Post 27.01 %   GLOBAL % Change -2.49 %      PHQ-9: Recent Review Flowsheet Data    [facility] 12/03/2016 09/08/2016   Decreased Interest 0 0  Down, Depressed, Hopeless 0 0   PHQ - 2 Score 0 0      Psychosocial Evaluation and Intervention:     Psychosocial Evaluation - 12/03/16 0933      Discharge Psychosocial Assessment & Intervention   Discharge Continue support measures as needed   Comments no psychosocial needs identified, no interventions necessary.        Psychosocial Re-Evaluation:     Psychosocial Re-Evaluation    Row Name 10/27/16 0744 11/25/16 0810           Psychosocial Re-Evaluation   Interventions Encouraged to attend Cardiac Rehabilitation for the exercise Encouraged to attend Cardiac Rehabilitation for the exercise      Comments no psychosocial needs identified, no interventions necessary . pt is walking and stretching at home on his own.  pt is looking forward to being able to bowl and play golf again soon. no psychosocial needs identified, no interventions necessary . pt is walking and stretching at home on his own.        Continued Psychosocial Services Needed No No         Vocational Rehabilitation: Provide vocational rehab assistance to qualifying candidates.   Vocational Rehab Evaluation & Intervention:     Vocational Rehab - 09/05/16 0855      Initial Vocational Rehab Evaluation & Intervention   Assessment shows need for Vocational Rehabilitation Yes  pt has returned back to work      Education: Education Goals: Education classes will be provided on a weekly basis, covering required topics. Participant will state understanding/return demonstration of topics presented.  Learning Barriers/Preferences:     Learning Barriers/Preferences - 09/04/16 1438      Learning Barriers/Preferences   Learning Barriers None;Sight      Education Topics: Count  Your Pulse:  -Group instruction provided by verbal instruction, demonstration, patient participation and written materials to support subject.  Instructors address importance of being able to find your pulse and how to count your pulse when at home without a heart monitor.  Patients get hands on experience counting their pulse with staff help and individually. Flowsheet Row CARDIAC REHAB PHASE II EXERCISE from 11/26/2016 in [place] [facility]  Date  09/26/16  Educator  Andi Hence, RN  Instruction Review Code  2- meets goals/outcomes      Heart Attack, Angina, and Risk Factor Modification:  -Group instruction provided by verbal instruction, video, and written materials to support subject.  Instructors address signs and symptoms of angina and heart attacks.    Also discuss risk factors for heart disease and how to make changes to improve heart health risk factors. Flowsheet Row CARDIAC REHAB PHASE II EXERCISE from 11/26/2016 in [facility]  Date  10/31/16  Instruction Review Code  2- meets goals/outcomes      Functional Fitness:  -Group instruction provided by verbal instruction, demonstration, patient participation, and written materials to support subject.  Instructors address safety measures for doing things around the house.  Discuss how to get up and down off the floor, how to pick things up properly, how to safely get out of a chair without assistance, and balance training. Flowsheet Row CARDIAC REHAB PHASE II EXERCISE from 11/26/2016 in [facility]  Date  11/14/16  Educator  Cleda Mccreedy, [place]  Instruction Review Code  R- Review/reinforce      Meditation and Mindfulness:  -Group instruction provided by verbal instruction, patient participation, and written materials to support  subject.  Instructor addresses importance of mindfulness and meditation practice to help reduce stress and improve awareness.   Instructor also leads participants through a meditation exercise.  Flowsheet Row CARDIAC REHAB PHASE II EXERCISE from 11/26/2016 in [facility]  Date  11/19/16  Instruction Review Code  2- meets goals/outcomes      Stretching for Flexibility and Mobility:  -Group instruction provided by verbal instruction, patient participation, and written materials to support subject.  Instructors lead participants through series of stretches that are designed to increase flexibility thus improving mobility.  These stretches are additional exercise for major muscle groups that are typically performed during regular warm up and cool down. Flowsheet Row CARDIAC REHAB PHASE II EXERCISE from 11/26/2016 in [facility]  Date  10/29/16  Instruction Review Code  2- meets goals/outcomes      Hands Only CPR Anytime:  -Group instruction provided by verbal instruction, video, patient participation and written materials to support subject.  Instructors co-teach with [facility] video for hands only CPR.  Participants get hands on experience with mannequins.   Nutrition I class: Heart Healthy Eating:  -Group instruction provided by PowerPoint slides, verbal discussion, and written materials to support subject matter. The instructor gives an explanation and review of the Therapeutic Lifestyle Changes diet recommendations, which includes a discussion on lipid goals, dietary fat, sodium, fiber, plant stanol/sterol esters, sugar, and the components of a well-balanced, healthy diet. Flowsheet Row CARDIAC REHAB PHASE II EXERCISE from 11/26/2016 in [facility]  Date  09/30/16  Educator  RD  Instruction Review Code  2- meets goals/outcomes      Nutrition II class: Lifestyle Skills:  -Group instruction provided by PowerPoint slides, verbal discussion, and written materials to support subject matter. The instructor gives an explanation and review  of label reading, grocery shopping for heart health, heart healthy recipe modifications, and ways to make healthier choices when eating out. Flowsheet Row CARDIAC REHAB PHASE II EXERCISE from 11/26/2016 in [facility]  Date  10/07/16  Educator  RD  Instruction Review Code  2- meets goals/outcomes      Diabetes Question & Answer:  -Group instruction provided by PowerPoint slides, verbal discussion, and written materials to support subject matter. The instructor gives an explanation and review of diabetes co-morbidities, pre- and post-prandial blood glucose goals, pre-exercise blood glucose goals, signs, symptoms, and treatment of hypoglycemia and hyperglycemia, and foot care basics. Flowsheet Row CARDIAC REHAB PHASE II EXERCISE from 11/26/2016 in [facility]  Date  11/07/16  Educator  RD  Instruction Review Code  2- meets goals/outcomes      Diabetes Blitz:  -Group instruction provided by PowerPoint slides, verbal discussion, and written materials to support subject matter. The instructor gives an explanation and review of the physiology behind type 1 and type 2 diabetes, diabetes medications and rational behind using different medications, pre- and post-prandial blood glucose recommendations and Hemoglobin A1c goals, diabetes diet, and exercise including blood glucose guidelines for exercising safely.    Portion Distortion:  -Group instruction provided by PowerPoint slides, verbal discussion, written materials, and food models to support subject matter. The instructor gives an explanation of serving size versus portion size, changes in portions sizes over the last 20 years, and what consists of a serving from each food group. Flowsheet Row CARDIAC REHAB PHASE II EXERCISE from 11/26/2016 in [facility]  Date  11/26/16  Educator  RD  Instruction Review Code  2- meets goals/outcomes      Stress  Management:  -Group instruction provided by verbal instruction, video, and written materials to support subject matter.  Instructors review role of stress in heart disease and how to cope with stress positively.   Flowsheet Row CARDIAC REHAB PHASE II EXERCISE from 11/26/2016 in [place]  Date  10/08/16  Instruction Review Code  2- meets goals/outcomes      Exercising on Your Own:  -Group instruction provided by verbal instruction, power point, and written materials to support subject.  Instructors discuss benefits of exercise, components of exercise, frequency and intensity of exercise, and end points for exercise.  Also discuss use of nitroglycerin and activating EMS.  Review options of places to exercise outside of rehab.  Review guidelines for sex with heart disease. Flowsheet Row CARDIAC REHAB PHASE II EXERCISE from 11/26/2016 in [place]  Date  10/10/16  Instruction Review Code  2- meets goals/outcomes      Cardiac Drugs I:  -Group instruction provided by verbal instruction and written materials to support subject.  Instructor reviews cardiac drug classes: antiplatelets, anticoagulants, beta blockers, and statins.  Instructor discusses reasons, side effects, and lifestyle considerations for each drug class. Flowsheet Row CARDIAC REHAB PHASE II EXERCISE from 11/26/2016 in [place]  Date  11/05/16  Instruction Review Code  2- meets goals/outcomes      Cardiac Drugs II:  -Group instruction provided by verbal instruction and written materials to support subject.  Instructor reviews cardiac drug classes: angiotensin converting enzyme inhibitors (ACE-I), angiotensin II receptor blockers (ARBs), nitrates, and calcium channel blockers.  Instructor discusses reasons, side effects, and lifestyle considerations for each drug class. Flowsheet Row CARDIAC REHAB PHASE II EXERCISE from 11/26/2016 in [place]  Date  10/01/16  Instruction Review Code  2- meets goals/outcomes      Anatomy and Physiology of the Circulatory System:  -Group instruction provided by verbal instruction, video, and written materials to support subject.  Reviews functional anatomy of heart, how it relates to various diagnoses, and what role the heart plays in the overall system. [place] PHASE II EXERCISE from 11/26/2016 in [facility]  Date  11/12/16  Instruction Review Code  2- meets goals/outcomes      Knowledge Questionnaire Score:     Knowledge Questionnaire Score - 09/04/16 1147      Knowledge Questionnaire Score   Pre Score 20/24      Core Components/Risk Factors/Patient Goals at Admission:     Personal Goals and Risk Factors at Admission - 09/04/16 0909      Core Components/Risk Factors/Patient Goals on Admission    Weight Management Yes;Weight Loss   Intervention Weight Management: Develop a combined nutrition and exercise program designed to reach desired caloric intake, while maintaining appropriate intake of nutrient and fiber, sodium and fats, and appropriate energy expenditure required for the weight goal.;Weight Management: Provide education and appropriate resources to help participant work on and attain dietary goals.;Weight Management/Obesity: Establish reasonable short term and long term weight goals.;Obesity: Provide education and appropriate resources to help participant work on and attain dietary goals.   Expected Outcomes Short Term: Continue to assess and modify interventions until short term weight is achieved;Weight Maintenance: Understanding of the daily nutrition guidelines, which includes 25-35% calories from fat, 7% or less cal  from saturated fats, less than 280m cholesterol, less than 1.5gm of sodium, & 5 or more servings of fruits and vegetables daily;Long Term: Adherence to nutrition and physical  activity/exercise program aimed toward attainment of established weight goal;Understanding recommendations for meals to include 15-35% energy as protein, 25-35% energy from fat, 35-60% energy from carbohydrates, less than 2084mof dietary cholesterol, 20-35 gm of total fiber daily;Weight Loss: Understanding of general recommendations for a balanced deficit meal plan, which promotes 1-2 lb weight loss per week and includes a negative energy balance of [phone] kcal/d;Understanding of distribution of calorie intake throughout the day with the consumption of 4-5 meals/snacks   Sedentary Yes   Intervention Provide advice, education, support and counseling about physical activity/exercise needs.;Develop an individualized exercise prescription for aerobic and resistive training based on initial evaluation findings, risk stratification, comorbidities and participant's personal goals.   Expected Outcomes Achievement of increased cardiorespiratory fitness and enhanced flexibility, muscular endurance and strength shown through measurements of functional capacity and personal statement of participant.   Increase Strength and Stamina Yes   Intervention Provide advice, education, support and counseling about physical activity/exercise needs.;Develop an individualized exercise prescription for aerobic and resistive training based on initial evaluation findings, risk stratification, comorbidities and participant's personal goals.   Expected Outcomes Achievement of increased cardiorespiratory fitness and enhanced flexibility, muscular endurance and strength shown through measurements of functional capacity and personal statement of participant.   Lipids Yes   Intervention Provide education and support for participant on nutrition & aerobic/resistive exercise along with prescribed medications to achieve LDL <7016mHDL >68m15m Expected Outcomes Short Term: Participant states understanding of desired cholesterol values and  is compliant with medications prescribed. Participant is following exercise prescription and nutrition guidelines.;Long Term: Cholesterol controlled with medications as prescribed, with individualized exercise RX and with personalized nutrition plan. Value goals: LDL < 70mg51mL > 40 mg.   Personal Goal Other Yes   Personal Goal Improve strength and get back resistance training. Improve breathing quality   Intervention Provide education on diaphragmatic breathing. Provide exercise guidelines to improve cardiovascular fitness   Expected Outcomes Pt will be able return to resistance training to improve strength and be able to breath better.      Core Components/Risk Factors/Patient Goals Review:      Goals and Risk Factor Review    Row Name 09/30/16 0957 10/23/16 1551 11/13/16 1500         Core Components/Risk Factors/Patient Goals Review   Personal Goals Review Other;Increase Strength and Stamina Other;Increase Strength and Stamina  -     Review Pt has increase size in handheld weights and feels as though getting stronger. Pt breathing has also improved Pt is interested in strength training. A request was sent to cardiologist. received clearance and will initiate weight lifiting on Friday 10/25/16 Pt is active in weight lifting in cardiac rehab. Pt increased weight load from 40lbs to 50lbs.  Pt has noticed an increase in muscle mass and breathing capacity      Expected Outcomes Pt will continue to improve in breathing/aerobic capacity and build in strength. Pt will get stronger and build in lean muscle mass Pt will continue to get stronger and increase lean muscle mass and breathing capacity.        Core Components/Risk Factors/Patient Goals at Discharge (Final Review):      Goals and Risk Factor Review - 11/13/16 1500      Core Components/Risk Factors/Patient Goals Review   Review Pt is active in  weight lifting in cardiac rehab. Pt increased weight load from 40lbs to 50lbs.  Pt has  noticed an increase in muscle mass and breathing capacity    Expected Outcomes Pt will continue to get stronger and increase lean muscle mass and breathing capacity.      ITP Comments:     ITP Comments    Row Name 09/04/16 0905 11/21/16 0816         ITP Comments Dr. Fransico Him, Medical Director Attended CPR education class, Met outcomes/goals          Comments: Pt graduated from cardiac rehab program today with completion of 35 exercise sessions in Phase II. Pt maintained good attendance and progressed nicely during his participation in rehab as evidenced by increased MET level.   Medication list reconciled. Repeat  PHQ score-0.  Pt displays very positive, hopeful outlook with great coping skills  .  Pt has made significant lifestyle changes and should be commended for his success. Pt feels he has achieved his goals during cardiac rehab, which include increased strength and stamina with renewed confidence in exercise abilities.   Pt plans to continue exercising on his own walking and stretching.

## 2016-12-05 ENCOUNTER — Encounter (HOSPITAL_COMMUNITY): Payer: BLUE CROSS/BLUE SHIELD

## 2016-12-08 ENCOUNTER — Encounter (HOSPITAL_COMMUNITY): Payer: BLUE CROSS/BLUE SHIELD

## 2016-12-10 ENCOUNTER — Encounter (HOSPITAL_COMMUNITY): Payer: BLUE CROSS/BLUE SHIELD

## 2016-12-12 ENCOUNTER — Encounter (HOSPITAL_COMMUNITY): Payer: BLUE CROSS/BLUE SHIELD

## 2016-12-15 ENCOUNTER — Encounter (HOSPITAL_COMMUNITY): Payer: BLUE CROSS/BLUE SHIELD

## 2016-12-17 ENCOUNTER — Encounter (HOSPITAL_COMMUNITY): Payer: BLUE CROSS/BLUE SHIELD

## 2016-12-19 ENCOUNTER — Encounter (HOSPITAL_COMMUNITY): Payer: BLUE CROSS/BLUE SHIELD

## 2016-12-22 ENCOUNTER — Encounter (HOSPITAL_COMMUNITY): Payer: BLUE CROSS/BLUE SHIELD

## 2016-12-22 NOTE — Addendum Note (Signed)
Encounter addended by: Jewel Baize, RD on: 12/22/2016  8:21 AM<BR>    Actions taken: Flowsheet data copied forward, Visit Navigator Flowsheet section accepted

## 2016-12-24 ENCOUNTER — Encounter (HOSPITAL_COMMUNITY): Payer: BLUE CROSS/BLUE SHIELD

## 2016-12-26 ENCOUNTER — Encounter (HOSPITAL_COMMUNITY): Payer: BLUE CROSS/BLUE SHIELD

## 2016-12-29 ENCOUNTER — Encounter (HOSPITAL_COMMUNITY): Payer: BLUE CROSS/BLUE SHIELD

## 2016-12-31 ENCOUNTER — Encounter (HOSPITAL_COMMUNITY): Payer: BLUE CROSS/BLUE SHIELD

## 2017-01-01 ENCOUNTER — Encounter: Payer: Self-pay | Admitting: Gastroenterology

## 2017-01-02 ENCOUNTER — Encounter (HOSPITAL_COMMUNITY): Payer: BLUE CROSS/BLUE SHIELD

## 2017-01-05 ENCOUNTER — Telehealth: Payer: Self-pay | Admitting: Internal Medicine

## 2017-01-05 ENCOUNTER — Encounter (HOSPITAL_COMMUNITY): Payer: BLUE CROSS/BLUE SHIELD

## 2017-01-05 ENCOUNTER — Encounter: Payer: Self-pay | Admitting: Internal Medicine

## 2017-01-05 NOTE — Telephone Encounter (Signed)
Patient due for recall colon. Former Dr.Kaplan patient but patient wanting to transfer to Dr.Gessner due to wife seeing him. Do you accept patient?

## 2017-01-05 NOTE — Telephone Encounter (Signed)
OK 

## 2017-01-07 ENCOUNTER — Encounter (HOSPITAL_COMMUNITY): Payer: BLUE CROSS/BLUE SHIELD

## 2017-01-09 ENCOUNTER — Encounter (HOSPITAL_COMMUNITY): Payer: BLUE CROSS/BLUE SHIELD

## 2017-01-13 ENCOUNTER — Telehealth: Payer: Self-pay

## 2017-01-13 MED ORDER — OSELTAMIVIR PHOSPHATE 75 MG PO CAPS: 75.0000 mg | ORAL_CAPSULE | Freq: Two times a day (BID) | ORAL | 0 refills | Status: AC

## 2017-01-13 NOTE — Telephone Encounter (Signed)
Tried to call pt and pt wife. I have sent in the tamiflu for him.

## 2017-01-13 NOTE — Telephone Encounter (Signed)
Okay to send tamiflu 75 mg bid  #10 x 0 Needs to be seen if sig SOB or worsening in next few days Needs to keep up with fluids

## 2017-01-13 NOTE — Telephone Encounter (Signed)
James [name] said she was dx with the flu and on 01/09/17 pt started wth prod cough (not sure color of phelgm),. Diarrhea which is now watery diarrhea, nausea but no vomiting, fever today 101.7. No earache or S/T. [name] request tamiflu to [facility] [place].

## 2017-01-13 NOTE — Telephone Encounter (Signed)
Spoke to wife, Vaughan Basta.

## 2017-02-25 ENCOUNTER — Ambulatory Visit (INDEPENDENT_AMBULATORY_CARE_PROVIDER_SITE_OTHER): Payer: BLUE CROSS/BLUE SHIELD | Admitting: Internal Medicine

## 2017-02-25 ENCOUNTER — Encounter: Payer: Self-pay | Admitting: Internal Medicine

## 2017-02-25 VITALS — BP 100/68 | HR 76 | Ht 69.29 in | Wt 195.0 lb

## 2017-02-25 DIAGNOSIS — Z9861 Coronary angioplasty status: Secondary | ICD-10-CM | POA: Diagnosis not present

## 2017-02-25 DIAGNOSIS — I2510 Atherosclerotic heart disease of native coronary artery without angina pectoris: Secondary | ICD-10-CM

## 2017-02-25 DIAGNOSIS — Z86010 Personal history of colonic polyps: Secondary | ICD-10-CM | POA: Diagnosis not present

## 2017-02-25 DIAGNOSIS — Z7902 Long term (current) use of antithrombotics/antiplatelets: Secondary | ICD-10-CM | POA: Diagnosis not present

## 2017-02-25 NOTE — Progress Notes (Signed)
   James [name] 61 y.o. 1956/02/04 [id number]  Assessment & Plan:   Encounter Diagnoses  Name Primary?  Marland Kitchen Hx of adenomatous colonic polyps Yes  . Long term current use of antithrombotics/antiplatelets - ticagrelor   . CAD S/P percutaneous coronary angioplasty     Appropriate for surveillance colonoscopy but will delay until 1 yr after drug eluting stent he had for STEMI 07/2016 so recall 07/2017. At that point usu ok to hold anti-PLYT agent and continue ASA.   I appreciate the opportunity to care for this patient. HT:XHFSFSE Silvio Pate, MD Dr. Michiel Sites D Noureddine and Homero Fellers, NP - [facility]  Subjective:   Chief Complaint: hx colon polyps  HPI Hx adenomatous colon polyps last 3 yrs ago - no GI sxs - 2010 had multiple adenomas max 2.3 cm TV adenoma w/ high-grade dysplasa and then 4 adenomas max 9 mm 2015. 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 Doing well now  - through cardiac rehab.  No Known Allergies Current Meds  Medication Sig  . aspirin EC 81 MG tablet Take 81 mg by mouth daily.  Marland Kitchen atorvastatin (LIPITOR) 80 MG tablet Take 80 mg by mouth daily.  . Coenzyme Q10-Fish Oil-Vit E (CO-Q 10 OMEGA-3 FISH OIL PO) Take 1 tablet by mouth daily.  Marland Kitchen lisinopril (PRINIVIL,ZESTRIL) 2.5 MG tablet Take 2.5 mg by mouth daily.  . metoprolol tartrate (LOPRESSOR) 25 MG tablet Take 12.5 mg by mouth at bedtime.  . ticagrelor (BRILINTA) 90 MG TABS tablet Take by mouth 2 (two) times daily.   Past Medical History:  Diagnosis Date  . Adenomatous colon polyp 2010  . CAD (coronary artery disease)   . Hx of adenomatous colonic polyps 01/26/2009  . Hyperlipidemia   . MI (myocardial infarction)   . Tinea versicolor    Past Surgical History:  Procedure Laterality Date  . COLONOSCOPY W/ POLYPECTOMY    . CORONARY STENT PLACEMENT     at [facility]  . jaw tumor Left   . TONSILLECTOMY  1962   Social History   Social History  . Marital status: Married    Spouse name:  N/A  . Number of children: 2  . Years of education: N/A   Occupational History  . Training and development officer   Social History Main Topics  . Smoking status: Former Smoker    Quit date: 11/24/1978  . Smokeless tobacco: Never Used  . Alcohol use Yes     Comment: 1 to 2 beers a month  . Drug use: No  . Sexual activity: Not Asked   Other Topics Concern  . None   Social History Narrative   Former minor league shortstop   FHx - noGI diseases Review of Systems Allergy sxs No chest pain or dyspnea  Objective:   Physical Exam BP 100/68 (BP Location: Left Arm, Patient Position: Sitting, Cuff Size: Normal)   Pulse 76   Ht 5' 9.29" (1.76 m) Comment: height measured without shoes  Wt 195 lb (88.5 kg)   BMI 28.56 kg/m  NAD Lungs cta Cor S1S2 No rmg

## 2017-02-25 NOTE — Patient Instructions (Addendum)
    We will plan to notify you about repeating colonoscopy after September this year when it should be ok to temporarily hold Brilinta.  I appreciate the opportunity to care for you. Gatha Mayer, MD, [facility]

## 2017-06-24 DIAGNOSIS — I2510 Atherosclerotic heart disease of native coronary artery without angina pectoris: Secondary | ICD-10-CM | POA: Diagnosis not present

## 2017-07-11 DIAGNOSIS — N281 Cyst of kidney, acquired: Secondary | ICD-10-CM | POA: Diagnosis not present

## 2017-07-11 DIAGNOSIS — K920 Hematemesis: Secondary | ICD-10-CM | POA: Insufficient documentation

## 2017-07-11 DIAGNOSIS — I2510 Atherosclerotic heart disease of native coronary artery without angina pectoris: Secondary | ICD-10-CM | POA: Diagnosis not present

## 2017-07-11 DIAGNOSIS — K210 Gastro-esophageal reflux disease with esophagitis: Secondary | ICD-10-CM | POA: Diagnosis not present

## 2017-07-11 DIAGNOSIS — IMO0001 Reserved for inherently not codable concepts without codable children: Secondary | ICD-10-CM | POA: Insufficient documentation

## 2017-07-11 DIAGNOSIS — K7689 Other specified diseases of liver: Secondary | ICD-10-CM | POA: Diagnosis not present

## 2017-07-11 DIAGNOSIS — K922 Gastrointestinal hemorrhage, unspecified: Secondary | ICD-10-CM | POA: Diagnosis not present

## 2017-07-11 DIAGNOSIS — R079 Chest pain, unspecified: Secondary | ICD-10-CM | POA: Diagnosis not present

## 2017-07-11 DIAGNOSIS — Z955 Presence of coronary angioplasty implant and graft: Secondary | ICD-10-CM | POA: Diagnosis not present

## 2017-07-11 DIAGNOSIS — R51 Headache: Secondary | ICD-10-CM | POA: Diagnosis not present

## 2017-07-11 DIAGNOSIS — Z789 Other specified health status: Secondary | ICD-10-CM | POA: Insufficient documentation

## 2017-07-11 DIAGNOSIS — K253 Acute gastric ulcer without hemorrhage or perforation: Secondary | ICD-10-CM | POA: Diagnosis not present

## 2017-07-11 DIAGNOSIS — R42 Dizziness and giddiness: Secondary | ICD-10-CM | POA: Diagnosis not present

## 2017-07-11 DIAGNOSIS — I252 Old myocardial infarction: Secondary | ICD-10-CM | POA: Insufficient documentation

## 2017-07-11 DIAGNOSIS — K209 Esophagitis, unspecified: Secondary | ICD-10-CM | POA: Diagnosis not present

## 2017-07-11 DIAGNOSIS — Z8679 Personal history of other diseases of the circulatory system: Secondary | ICD-10-CM | POA: Diagnosis not present

## 2017-07-11 DIAGNOSIS — D649 Anemia, unspecified: Secondary | ICD-10-CM | POA: Diagnosis not present

## 2017-07-11 DIAGNOSIS — K2950 Unspecified chronic gastritis without bleeding: Secondary | ICD-10-CM | POA: Diagnosis not present

## 2017-07-11 DIAGNOSIS — D62 Acute posthemorrhagic anemia: Secondary | ICD-10-CM | POA: Diagnosis not present

## 2017-07-11 DIAGNOSIS — R112 Nausea with vomiting, unspecified: Secondary | ICD-10-CM | POA: Diagnosis not present

## 2017-07-11 DIAGNOSIS — K259 Gastric ulcer, unspecified as acute or chronic, without hemorrhage or perforation: Secondary | ICD-10-CM | POA: Diagnosis not present

## 2017-07-11 DIAGNOSIS — I255 Ischemic cardiomyopathy: Secondary | ICD-10-CM | POA: Diagnosis not present

## 2017-07-11 DIAGNOSIS — R0600 Dyspnea, unspecified: Secondary | ICD-10-CM | POA: Diagnosis not present

## 2017-07-11 DIAGNOSIS — K264 Chronic or unspecified duodenal ulcer with hemorrhage: Secondary | ICD-10-CM | POA: Diagnosis not present

## 2017-07-11 DIAGNOSIS — K254 Chronic or unspecified gastric ulcer with hemorrhage: Secondary | ICD-10-CM | POA: Diagnosis not present

## 2017-07-11 DIAGNOSIS — R0602 Shortness of breath: Secondary | ICD-10-CM | POA: Diagnosis not present

## 2017-07-11 DIAGNOSIS — R55 Syncope and collapse: Secondary | ICD-10-CM | POA: Diagnosis not present

## 2017-07-11 DIAGNOSIS — J323 Chronic sphenoidal sinusitis: Secondary | ICD-10-CM | POA: Diagnosis not present

## 2017-07-11 DIAGNOSIS — B9681 Helicobacter pylori [H. pylori] as the cause of diseases classified elsewhere: Secondary | ICD-10-CM | POA: Diagnosis not present

## 2017-07-11 DIAGNOSIS — I700 Atherosclerosis of aorta: Secondary | ICD-10-CM | POA: Diagnosis not present

## 2017-07-11 DIAGNOSIS — I951 Orthostatic hypotension: Secondary | ICD-10-CM | POA: Insufficient documentation

## 2017-07-11 DIAGNOSIS — Z531 Procedure and treatment not carried out because of patient's decision for reasons of belief and group pressure: Secondary | ICD-10-CM | POA: Diagnosis not present

## 2017-07-11 DIAGNOSIS — E785 Hyperlipidemia, unspecified: Secondary | ICD-10-CM | POA: Diagnosis not present

## 2017-07-11 DIAGNOSIS — K269 Duodenal ulcer, unspecified as acute or chronic, without hemorrhage or perforation: Secondary | ICD-10-CM | POA: Diagnosis not present

## 2017-07-11 DIAGNOSIS — R531 Weakness: Secondary | ICD-10-CM | POA: Diagnosis not present

## 2017-07-11 DIAGNOSIS — K263 Acute duodenal ulcer without hemorrhage or perforation: Secondary | ICD-10-CM | POA: Diagnosis not present

## 2017-07-11 DIAGNOSIS — R7989 Other specified abnormal findings of blood chemistry: Secondary | ICD-10-CM | POA: Diagnosis not present

## 2017-07-12 ENCOUNTER — Encounter: Payer: Self-pay | Admitting: Internal Medicine

## 2017-07-12 DIAGNOSIS — R7989 Other specified abnormal findings of blood chemistry: Secondary | ICD-10-CM | POA: Insufficient documentation

## 2017-07-20 ENCOUNTER — Other Ambulatory Visit: Payer: Self-pay | Admitting: *Deleted

## 2017-07-20 NOTE — Patient Outreach (Signed)
[name]) Care Management  07/20/2017  James [name] 1955/11/26 [id number]   Subjective: Telephone call to patient's home number, no answer, left HIPAA compliant voicemail message, and requested call back.   Objective: Per chart review, patient hospitalized 07/11/17- 07/16/17 for Gastrointestinal hemorrhage.   Patient also has a history of CAD.   Assessment: Received UMR Transition of care referral on 07/14/17.   Transition of care follow up pending patient contact.     Plan: RNCM will call patient for 2nd telephone outreach attempt, transition of care follow up, within 10 business days if no return call.    James [name] H. Annia Friendly, BSN, [place] [facility] Telephonic CM Phone: [phone] Fax: [phone]

## 2017-07-21 ENCOUNTER — Other Ambulatory Visit: Payer: Self-pay | Admitting: *Deleted

## 2017-07-21 NOTE — Patient Outreach (Addendum)
[facility]) Care Management  07/21/2017  James [name] [birth year] [id number]   Subjective: Telephone call to patient's home number, no answer, left HIPAA compliant voicemail message, and requested call back.   Objective: Per chart review, patient hospitalized 07/11/17- 07/16/17 for Gastrointestinal hemorrhage.   Patient also has a history of CAD.   Assessment: Received UMR Transition of care referral on 07/14/17. Transition of care follow up pending patient contact.     Plan: RNCM will call patient for 3rd telephone outreach attempt, transition of care follow up, within 10 business days if no return call.    Brienna Bass H. Annia Friendly, BSN, [place] [facility] Telephonic CM Phone: [phone] Fax: [phone]

## 2017-07-22 ENCOUNTER — Encounter: Payer: Self-pay | Admitting: *Deleted

## 2017-07-22 ENCOUNTER — Other Ambulatory Visit: Payer: Self-pay | Admitting: *Deleted

## 2017-07-22 NOTE — Patient Outreach (Signed)
[facility]) Care Management  07/22/2017  James [name] [birth year] [id number]   Subjective:Telephone call to patient's home number, no answer, left HIPAA compliant voicemail message, and requested call back.   Objective: Per chart review, patient hospitalized 07/11/17- 07/16/17 for Gastrointestinal hemorrhage. Patient also has a history of CAD.   Assessment: Received UMR Transition of care referral on 07/14/17. Transition of care follow up pending patient contact.     Plan:RNCM will send unsuccessful outreach  letter, [facility] pamphlet, and proceed with case closure, within 10 business days if no return call.   Shaneisha Burkel H. Annia Friendly, BSN, [place] [facility] Telephonic CM Phone: [phone] Fax: [phone]

## 2017-07-23 DIAGNOSIS — K922 Gastrointestinal hemorrhage, unspecified: Secondary | ICD-10-CM | POA: Diagnosis not present

## 2017-07-23 DIAGNOSIS — K279 Peptic ulcer, site unspecified, unspecified as acute or chronic, without hemorrhage or perforation: Secondary | ICD-10-CM | POA: Diagnosis not present

## 2017-07-23 DIAGNOSIS — Z86010 Personal history of colonic polyps: Secondary | ICD-10-CM | POA: Diagnosis not present

## 2017-07-23 DIAGNOSIS — D649 Anemia, unspecified: Secondary | ICD-10-CM | POA: Diagnosis not present

## 2017-07-29 ENCOUNTER — Ambulatory Visit: Payer: 59 | Admitting: Internal Medicine

## 2017-07-31 ENCOUNTER — Ambulatory Visit (INDEPENDENT_AMBULATORY_CARE_PROVIDER_SITE_OTHER): Payer: 59 | Admitting: Internal Medicine

## 2017-07-31 ENCOUNTER — Encounter: Payer: Self-pay | Admitting: Internal Medicine

## 2017-07-31 VITALS — BP 118/70 | HR 71 | Temp 97.8°F | Wt 202.0 lb

## 2017-07-31 DIAGNOSIS — K259 Gastric ulcer, unspecified as acute or chronic, without hemorrhage or perforation: Secondary | ICD-10-CM

## 2017-07-31 DIAGNOSIS — I2510 Atherosclerotic heart disease of native coronary artery without angina pectoris: Secondary | ICD-10-CM | POA: Diagnosis not present

## 2017-07-31 DIAGNOSIS — B9681 Helicobacter pylori [H. pylori] as the cause of diseases classified elsewhere: Secondary | ICD-10-CM

## 2017-07-31 DIAGNOSIS — K250 Acute gastric ulcer with hemorrhage: Secondary | ICD-10-CM | POA: Diagnosis not present

## 2017-07-31 HISTORY — DX: Helicobacter pylori (H. pylori) as the cause of diseases classified elsewhere: B96.81

## 2017-07-31 HISTORY — DX: Helicobacter pylori (H. pylori) as the cause of diseases classified elsewhere: K25.9

## 2017-07-31 LAB — CBC WITH DIFFERENTIAL/PLATELET
BASOS PCT: 1.1 % (ref 0.0–3.0)
Basophils Absolute: 0 10*3/uL (ref 0.0–0.1)
EOS PCT: 1.3 % (ref 0.0–5.0)
Eosinophils Absolute: 0.1 10*3/uL (ref 0.0–0.7)
HCT: 37.9 % — ABNORMAL LOW (ref 39.0–52.0)
Hemoglobin: 11.8 g/dL — ABNORMAL LOW (ref 13.0–17.0)
LYMPHS ABS: 1.3 10*3/uL (ref 0.7–4.0)
Lymphocytes Relative: 32 % (ref 12.0–46.0)
MCHC: 31 g/dL (ref 30.0–36.0)
MCV: 96.1 fl (ref 78.0–100.0)
MONO ABS: 0.4 10*3/uL (ref 0.1–1.0)
Monocytes Relative: 9.9 % (ref 3.0–12.0)
NEUTROS PCT: 55.7 % (ref 43.0–77.0)
Neutro Abs: 2.2 10*3/uL (ref 1.4–7.7)
PLATELETS: 315 10*3/uL (ref 150.0–400.0)
RBC: 3.94 Mil/uL — AB (ref 4.22–5.81)
RDW: 16.1 % — AB (ref 11.5–15.5)
WBC: 4 10*3/uL (ref 4.0–10.5)

## 2017-07-31 NOTE — Assessment & Plan Note (Signed)
Off the brilinta now due to GI bleed

## 2017-07-31 NOTE — Assessment & Plan Note (Signed)
Due to H pylori Finishing antibiotics Should stay on the protonix GI at [facility] wanted weekly blood work--- I would recommend considerably less often, like repeating in 1 month if up from last time

## 2017-07-31 NOTE — Progress Notes (Signed)
Subjective:    Patient ID: James [name], male    DOB: 1956-03-25, 61 y.o.   MRN: [id number]  HPI Here for hospital follow up  Had significant GI bleed  Found to have duodenal and gastric ulcers Still taking protonix--but down to once a day Almost done with the antibiotics for H pylori brilinta stopped -- still on ASA daily  No abdominal pain No N/V Appetite is good  Got IV iron and is on oral iron  Current Outpatient Prescriptions on File Prior to Visit  Medication Sig Dispense Refill  . aspirin EC 81 MG tablet Take 81 mg by mouth daily.    Marland Kitchen atorvastatin (LIPITOR) 80 MG tablet Take 80 mg by mouth daily.    . Coenzyme Q10-Fish Oil-Vit E (CO-Q 10 OMEGA-3 FISH OIL PO) Take 1 tablet by mouth daily.    Marland Kitchen lisinopril (PRINIVIL,ZESTRIL) 2.5 MG tablet Take 2.5 mg by mouth daily.    . metoprolol tartrate (LOPRESSOR) 25 MG tablet Take 12.5 mg by mouth at bedtime.    . nitroGLYCERIN (NITROSTAT) 0.4 MG SL tablet Place 0.4 mg under the tongue every 5 (five) minutes as needed for chest pain.    . Diclofenac Sodium (PENNSAID) 2 % SOLN Place 2 application onto the skin 2 (two) times daily. (Patient not taking: Reported on 02/25/2017) 112 g 3   No current facility-administered medications on file prior to visit.     No Known Allergies  Past Medical History:  Diagnosis Date  . Adenomatous colon polyp 2010  . CAD (coronary artery disease)   . Hx of adenomatous colonic polyps 01/26/2009  . Hyperlipidemia   . MI (myocardial infarction) ([place])   . Tinea versicolor     Past Surgical History:  Procedure Laterality Date  . COLONOSCOPY W/ POLYPECTOMY    . CORONARY STENT PLACEMENT     at [facility]  . jaw tumor Left   . TONSILLECTOMY  1962    Family History  Problem Relation Age of Onset  . Colon cancer Neg Hx   . Stomach cancer Neg Hx     Social History   Social History  . Marital status: Married    Spouse name: N/A  . Number of children: 2  . Years of education: N/A    Occupational History  . Training and development officer   Social History Main Topics  . Smoking status: Former Smoker    Quit date: 11/24/1978  . Smokeless tobacco: Never Used  . Alcohol use Yes     Comment: 1 to 2 beers a month  . Drug use: No  . Sexual activity: Not on file   Other Topics Concern  . Not on file   Social History Narrative   Former minor league shortstop   Review of Systems Had not been on NSAIDs No chest pain No SOB Normal activity levels    Objective:   Physical Exam  Constitutional: He appears well-developed and well-nourished. No distress.  Neck: No thyromegaly present.  Cardiovascular: Normal rate, regular rhythm and normal heart sounds.  Exam reveals no gallop.   No murmur heard. Pulmonary/Chest: Effort normal and breath sounds normal. No respiratory distress. He has no wheezes. He has no rales.  Abdominal: Soft. He exhibits no distension. There is no tenderness. There is no rebound and no guarding.  Musculoskeletal: He exhibits no edema.  Lymphadenopathy:    He has no cervical adenopathy.          Assessment & Plan:

## 2017-07-31 NOTE — Patient Instructions (Signed)
Make sure you continue the acid blocker---pantoprozole (once a day after the two week mark). If your blood count is up, I would recommend waiting about a month before rechecking this

## 2017-08-04 ENCOUNTER — Telehealth: Payer: Self-pay

## 2017-08-04 NOTE — Telephone Encounter (Signed)
Left message on mobile # to call me back to set up appointment.

## 2017-08-04 NOTE — Telephone Encounter (Signed)
Patient called back and Megan set up his appointment for me.

## 2017-08-04 NOTE — Telephone Encounter (Signed)
-----   Message from Gatha Mayer, MD sent at 07/31/2017  3:23 PM EDT ----- I am ccing Rachid Parham - she can get him in one of those slots or another one that week  I should be able to see old records via care Everywhere ----- Message ----- From: Laverna Peace, RN Sent: 07/31/2017   7:14 AM To: Gatha Mayer, MD  Dr. Carlean Purl,  This pt's wife is a friend of mine and she texted me on Tuesday asking to help her set up an office appointment with you for him sometime the week of October 15-19th.  He recently was in [facility] for a GI bleed and he is to follow up with his GI doctor in October.  I was going to go ahead and set him up, but the only openings you have that week are for hemorrhoidal bandings.  Is it ok to schedule him in one of those spots?  Thanks, [employer]

## 2017-08-05 ENCOUNTER — Other Ambulatory Visit: Payer: Self-pay | Admitting: *Deleted

## 2017-08-05 NOTE — Patient Outreach (Signed)
[facility] Care Management  08/05/2017  James [name] 1956-07-07 [id number]  No response from patient outreach attempts will proceed with case closure.     Objective: Per chart review, patient hospitalized 07/11/17- 07/16/17 for Gastrointestinal hemorrhage. Patient also has a history of CAD.   Assessment: Received UMR Transition of care referral on 07/14/17. Transition of care follow up not completed due to unable to contact patient and will proceed with case closure.    Plan:RNCM will send case closure due to unable to reach request to [facility] [place] [facility].    Ima Hafner H. Annia Friendly, BSN, [place] Management [facility] Telephonic CM Phone: [phone] Fax: [phone]

## 2017-09-01 ENCOUNTER — Encounter: Payer: Self-pay | Admitting: Internal Medicine

## 2017-09-02 MED ORDER — FLUCONAZOLE 150 MG PO TABS: 150.0000 mg | ORAL_TABLET | Freq: Once | ORAL | 1 refills | Status: AC

## 2017-09-09 ENCOUNTER — Encounter: Payer: Self-pay | Admitting: Internal Medicine

## 2017-09-09 ENCOUNTER — Ambulatory Visit (INDEPENDENT_AMBULATORY_CARE_PROVIDER_SITE_OTHER): Payer: 59 | Admitting: Internal Medicine

## 2017-09-09 ENCOUNTER — Other Ambulatory Visit (INDEPENDENT_AMBULATORY_CARE_PROVIDER_SITE_OTHER): Payer: 59

## 2017-09-09 VITALS — BP 110/64 | HR 56 | Ht 69.5 in | Wt 203.0 lb

## 2017-09-09 DIAGNOSIS — Z86010 Personal history of colonic polyps: Secondary | ICD-10-CM | POA: Diagnosis not present

## 2017-09-09 DIAGNOSIS — D62 Acute posthemorrhagic anemia: Secondary | ICD-10-CM

## 2017-09-09 DIAGNOSIS — K269 Duodenal ulcer, unspecified as acute or chronic, without hemorrhage or perforation: Secondary | ICD-10-CM

## 2017-09-09 DIAGNOSIS — B9681 Helicobacter pylori [H. pylori] as the cause of diseases classified elsewhere: Secondary | ICD-10-CM | POA: Diagnosis not present

## 2017-09-09 DIAGNOSIS — K259 Gastric ulcer, unspecified as acute or chronic, without hemorrhage or perforation: Secondary | ICD-10-CM

## 2017-09-09 HISTORY — DX: Helicobacter pylori (H. pylori) as the cause of diseases classified elsewhere: B96.81

## 2017-09-09 LAB — CBC WITH DIFFERENTIAL/PLATELET
BASOS PCT: 1.1 % (ref 0.0–3.0)
Basophils Absolute: 0.1 10*3/uL (ref 0.0–0.1)
EOS PCT: 1.8 % (ref 0.0–5.0)
Eosinophils Absolute: 0.1 10*3/uL (ref 0.0–0.7)
HCT: 47.4 % (ref 39.0–52.0)
Hemoglobin: 15 g/dL (ref 13.0–17.0)
LYMPHS ABS: 2.9 10*3/uL (ref 0.7–4.0)
Lymphocytes Relative: 57.5 % — ABNORMAL HIGH (ref 12.0–46.0)
MCHC: 31.7 g/dL (ref 30.0–36.0)
MCV: 90.9 fl (ref 78.0–100.0)
MONO ABS: 0.6 10*3/uL (ref 0.1–1.0)
Monocytes Relative: 12.2 % — ABNORMAL HIGH (ref 3.0–12.0)
NEUTROS ABS: 1.4 10*3/uL (ref 1.4–7.7)
NEUTROS PCT: 27.4 % — AB (ref 43.0–77.0)
PLATELETS: 227 10*3/uL (ref 150.0–400.0)
RBC: 5.22 Mil/uL (ref 4.22–5.81)
RDW: 13.8 % (ref 11.5–15.5)
WBC: 5 10*3/uL (ref 4.0–10.5)

## 2017-09-09 NOTE — Patient Instructions (Addendum)
  It has been recommended to you by your physician that you have a(n) colonoscopy completed. Per your request, we did not schedule the procedure(s) today. Please contact our office at [phone] should you decide to have the procedure completed.  They will set you up for a free pre-visit with the nurse.   Stop your pantoprazole for 2 weeks and then go to the [place] lab to have the breath test done.   Your physician has requested that you go to the basement for the following lab work before leaving today: CBC/diff, Ferritin  I appreciate the opportunity to care for you. Silvano Rusk, MD, [facility]

## 2017-09-09 NOTE — Assessment & Plan Note (Signed)
due

## 2017-09-09 NOTE — Assessment & Plan Note (Signed)
retest

## 2017-09-09 NOTE — Progress Notes (Signed)
James [name] 61 y.o. 22-Apr-1956 [id number]  Assessment & Plan:   Encounter Diagnoses  Name Primary?  . 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 Gastric ulcer due to Helicobacter pylori, unspecified chronicity   . Hx of adenomatous colonic polyps   . Acute blood loss anemia    He is significantly improved.  Plans for today are to check the following:  CBC Ferritin H pylori Breath test he will hold his PPI for 2 weeks prior to this.  He preferred this method over H. pylori stool antigen. colonoscopy for surveillance of adenomatous polyps.  The risks and benefits as well as alternatives of endoscopic procedure(s) have been discussed and reviewed. All questions answered. The patient agrees to proceed.   Subjective:   Chief Complaint: Follow-up of ulcers  HPI The patient is a pleasant African-American man known to me from previous history of adenomatous polyps, he was seen in April, but we had delayed his procedure because of being on an antiplatelet agent after a drug-eluting stent.  Subsequently in August he was hospitalized at [facility] and was found to have gastric and duodenal ulcers and H. pylori positive.  He was treated for H. pylori, and says he took antibiotics, and is on a PPI and feels much better.  He has also been given Feraheme injections and his hemoglobin was rising and he would like this checked again.  He is a Restaurant manager, fast food.  His cardiologist permanently discontinued his Brilinta the patient tells me.  He remains on aspirin. No Known Allergies Current Meds  Medication Sig  . aspirin EC 81 MG tablet Take 81 mg by mouth daily.  Marland Kitchen atorvastatin (LIPITOR) 80 MG tablet Take 80 mg by mouth daily.  . Coenzyme Q10-Fish Oil-Vit E (CO-Q 10 OMEGA-3 FISH OIL PO) Take 1 tablet by mouth daily.  . Diclofenac Sodium (PENNSAID) 2 % SOLN Place 2 application onto the skin 2 (two) times daily.  Marland Kitchen lisinopril (PRINIVIL,ZESTRIL) 2.5 MG tablet Take 2.5 mg by mouth daily.  .  metoprolol tartrate (LOPRESSOR) 25 MG tablet Take 12.5 mg by mouth at bedtime.  . nitroGLYCERIN (NITROSTAT) 0.4 MG SL tablet Place 0.4 mg under the tongue every 5 (five) minutes as needed for chest pain.  . pantoprazole (PROTONIX) 40 MG tablet Take 40 mg by mouth daily.   Past Medical History:  Diagnosis Date  . Adenomatous colon polyp 2010  . CAD (coronary artery disease)   . Hx of adenomatous colonic polyps 01/26/2009  . Hyperlipidemia   . MI (myocardial infarction) ([place])   . Tinea versicolor    Past Surgical History:  Procedure Laterality Date  . COLONOSCOPY W/ POLYPECTOMY    . CORONARY STENT PLACEMENT     at [facility]  . jaw tumor Left   . TONSILLECTOMY  1962   Social History   Social History  . Marital status: Married    Spouse name: N/A  . Number of children: 2  . Years of education: N/A   Occupational History  . Training and development officer   Social History Main Topics  . Smoking status: Former Smoker    Quit date: 11/24/1978  . Smokeless tobacco: Never Used  . Alcohol use Yes     Comment: 1 to 2 beers a month  . Drug use: No  . Sexual activity: Not on file   Other Topics Concern  . Not on file   Social History Narrative   Former minor league shortstop   family  history is not on file.   Review of Systems As per HPI  Objective:   Physical Exam BP 110/64   Pulse (!) 56   Ht 5' 9.5" (1.765 m)   Wt 203 lb (92.1 kg)   BMI 29.55 kg/m  No acute distress Eyes anicteric Mood and affect are appropriate  I have reviewed hospitalization records primary care notes in the computer including care everywhere GI records from [facility], pathology reports and endoscopy reports.  25 minutes time spent with patient > half in counseling coordination of care

## 2017-09-10 ENCOUNTER — Encounter: Payer: Self-pay | Admitting: Internal Medicine

## 2017-09-10 NOTE — Progress Notes (Signed)
My chart note to the patient regarding normal hemoglobin

## 2017-09-25 DIAGNOSIS — K259 Gastric ulcer, unspecified as acute or chronic, without hemorrhage or perforation: Secondary | ICD-10-CM | POA: Diagnosis not present

## 2017-09-25 DIAGNOSIS — B9681 Helicobacter pylori [H. pylori] as the cause of diseases classified elsewhere: Secondary | ICD-10-CM | POA: Diagnosis not present

## 2017-09-25 DIAGNOSIS — K269 Duodenal ulcer, unspecified as acute or chronic, without hemorrhage or perforation: Secondary | ICD-10-CM | POA: Diagnosis not present

## 2017-10-01 ENCOUNTER — Telehealth: Payer: Self-pay | Admitting: Internal Medicine

## 2017-10-01 NOTE — Telephone Encounter (Signed)
Left message on machine to call back  

## 2017-10-05 NOTE — Telephone Encounter (Signed)
A my chart message was sent to the patient by Dr. Carlean Purl Left message for patient to call back if he had any questions after reading the my chart message

## 2017-10-09 ENCOUNTER — Telehealth: Payer: Self-pay

## 2017-10-09 NOTE — Telephone Encounter (Signed)
-----   Message from Gatha Mayer, MD sent at 10/09/2017  8:26 AM EST ----- Regarding: H pylori negative Please let him know his H pylori breath test is negative  He should be back on his PPI for now and I will see him in Dec for colonoscopy  Thx

## 2017-10-09 NOTE — Telephone Encounter (Signed)
Left message on machine to call back  

## 2017-10-09 NOTE — Telephone Encounter (Signed)
The patient has been notified of this information and all questions answered. The pt is on PPI daily and will call if he has any problems or concerns

## 2017-10-12 ENCOUNTER — Other Ambulatory Visit: Payer: Self-pay

## 2017-10-12 ENCOUNTER — Ambulatory Visit (AMBULATORY_SURGERY_CENTER): Payer: Self-pay

## 2017-10-12 VITALS — Ht 70.0 in | Wt 206.0 lb

## 2017-10-12 DIAGNOSIS — Z86010 Personal history of colonic polyps: Secondary | ICD-10-CM

## 2017-10-12 NOTE — Progress Notes (Signed)
Denies allergies to eggs or soy products. Denies complication of anesthesia or sedation. Denies use of weight loss medication. Denies use of O2.   Emmi instructions declined.  

## 2017-10-19 ENCOUNTER — Ambulatory Visit (INDEPENDENT_AMBULATORY_CARE_PROVIDER_SITE_OTHER): Payer: 59 | Admitting: Podiatry

## 2017-10-19 ENCOUNTER — Encounter: Payer: Self-pay | Admitting: Podiatry

## 2017-10-19 ENCOUNTER — Ambulatory Visit (INDEPENDENT_AMBULATORY_CARE_PROVIDER_SITE_OTHER): Payer: 59

## 2017-10-19 DIAGNOSIS — M722 Plantar fascial fibromatosis: Secondary | ICD-10-CM

## 2017-10-19 MED ORDER — METHYLPREDNISOLONE 4 MG PO TBPK: ORAL_TABLET | ORAL | 0 refills | Status: DC

## 2017-10-19 NOTE — Patient Instructions (Signed)

## 2017-10-19 NOTE — Progress Notes (Signed)
   Subjective:    Patient ID: James [name], male    DOB: [birth year], 61 y.o.   MRN: [id number]  HPI: He presents today with a chief complaint of a 6-7 month duration of pain to his right heel. He denies any trauma. States it is particularly painful anytime reason sitting or sleeping and gets up to walk on the foot.    Review of Systems  All other systems reviewed and are negative.      Objective:   Physical Exam: Vital signs are stable he is alert and oriented 3. Pulses are palpable. Neurologic sensorium is intact. Deep tendon reflexes are intact. Muscle strength +5 over 5 dorsiflexion and plantar flexors and inverters everters onto the musculature is intact. Orthopedic evaluation and straight on palpation of medial calcaneal tubercle of the right heel. Moderate rectus arch is noted. Radiographs demonstrate an osseous immature individual small plantar distally oriented calcaneal heel spur with soft tissue increase in density at the plantar fascia calcaneal insertion site. Cutaneous evaluation does not demonstrate any type of open wound or lesion.        Assessment & Plan:  Assessment: Plantar fasciitis right foot.  Plan: Discussed etiology pathology conservative versus surgical therapies. After verbal consent was given I injected the right heel with 20 mg of Kenalog and local anesthetic today. He tolerated this procedure well. Area was prepped with Betadine and alcohol. He was provided the plantar fascial brace and his night splint. I also started him on a Medrol Dosepak to be followed by [facility].Marland Kitchen

## 2017-10-20 ENCOUNTER — Encounter: Payer: Self-pay | Admitting: Internal Medicine

## 2017-10-30 ENCOUNTER — Encounter: Payer: Self-pay | Admitting: Internal Medicine

## 2017-10-30 ENCOUNTER — Ambulatory Visit (AMBULATORY_SURGERY_CENTER): Payer: 59 | Admitting: Internal Medicine

## 2017-10-30 VITALS — BP 125/86 | HR 53 | Temp 98.4°F | Resp 14 | Ht 70.0 in | Wt 206.0 lb

## 2017-10-30 DIAGNOSIS — Z86010 Personal history of colonic polyps: Secondary | ICD-10-CM

## 2017-10-30 DIAGNOSIS — D128 Benign neoplasm of rectum: Secondary | ICD-10-CM

## 2017-10-30 DIAGNOSIS — I2510 Atherosclerotic heart disease of native coronary artery without angina pectoris: Secondary | ICD-10-CM | POA: Diagnosis not present

## 2017-10-30 DIAGNOSIS — D122 Benign neoplasm of ascending colon: Secondary | ICD-10-CM | POA: Diagnosis not present

## 2017-10-30 DIAGNOSIS — D123 Benign neoplasm of transverse colon: Secondary | ICD-10-CM

## 2017-10-30 DIAGNOSIS — K219 Gastro-esophageal reflux disease without esophagitis: Secondary | ICD-10-CM | POA: Diagnosis not present

## 2017-10-30 DIAGNOSIS — E669 Obesity, unspecified: Secondary | ICD-10-CM | POA: Diagnosis not present

## 2017-10-30 MED ORDER — SODIUM CHLORIDE 0.9 % IV SOLN: 500.0000 mL | Freq: Once | INTRAVENOUS | Status: DC

## 2017-10-30 NOTE — Progress Notes (Signed)
Report to PACU, RN, vss, BBS= Clear.  

## 2017-10-30 NOTE — Progress Notes (Signed)
Called to room to assist during endoscopic procedure.  Patient ID and intended procedure confirmed with present staff. Received instructions for my participation in the procedure from the performing physician.  

## 2017-10-30 NOTE — Op Note (Signed)
[facility] Patient Name: James [name] Procedure Date: 10/30/2017 2:51 PM MRN: [id number] Endoscopist: Gatha Mayer , MD Age: 61 Referring MD:  Date of Birth: [birth year] Gender: Male Account #: [id number] Procedure:                Colonoscopy Indications:              Surveillance: Personal history of adenomatous                            polyps on last colonoscopy 3 years ago Medicines:                Propofol per Anesthesia, Monitored Anesthesia Care Procedure:                Pre-Anesthesia Assessment:                           - Prior to the procedure, a History and Physical                            was performed, and patient medications and                            allergies were reviewed. The patient's tolerance of                            previous anesthesia was also reviewed. The risks                            and benefits of the procedure and the sedation                            options and risks were discussed with the patient.                            All questions were answered, and informed consent                            was obtained. Prior Anticoagulants: The patient has                            taken no previous anticoagulant or antiplatelet                            agents. ASA Grade Assessment: II - A patient with                            mild systemic disease. After reviewing the risks                            and benefits, the patient was deemed in                            satisfactory condition to undergo the procedure.  After obtaining informed consent, the colonoscope                            was passed under direct vision. Throughout the                            procedure, the patient's blood pressure, pulse, and                            oxygen saturations were monitored continuously. The                            Colonoscope was introduced through the anus and   advanced to the the cecum, identified by                            appendiceal orifice and ileocecal valve. The                            colonoscopy was performed without difficulty. The                            patient tolerated the procedure well. The quality                            of the bowel preparation was adequate. The bowel                            preparation used was Miralax. The ileocecal valve,                            appendiceal orifice, and rectum were photographed. Scope In: 3:03:42 PM Scope Out: 3:28:44 PM Scope Withdrawal Time: 0 hours 20 minutes 21 seconds  Total Procedure Duration: 0 hours 25 minutes 2 seconds  Findings:                 The perianal and digital rectal examinations were                            normal. Pertinent negatives include normal prostate                            (size, shape, and consistency).                           Four sessile polyps were found in the rectum,                            transverse colon and ascending colon. The polyps                            were diminutive in size. These polyps were removed                            with a  cold snare. Resection and retrieval were                            complete. Verification of patient identification                            for the specimen was done. Estimated blood loss was                            minimal.                           Multiple diverticula were found in the left colon.                           The exam was otherwise without abnormality on                            direct and retroflexion views. Complications:            No immediate complications. Estimated Blood Loss:     Estimated blood loss was minimal. Impression:               - Four diminutive polyps in the rectum, in the                            transverse colon and in the ascending colon,                            removed with a cold snare. Resected and retrieved.                            - Diverticulosis in the left colon.                           - The examination was otherwise normal on direct                            and retroflexion views. Recommendation:           - Patient has a contact number available for                            emergencies. The signs and symptoms of potential                            delayed complications were discussed with the                            patient. Return to normal activities tomorrow.                            Written discharge instructions were provided to the                            patient.                           -  Resume previous diet.                           - Continue present medications.                           - Repeat colonoscopy is recommended for                            surveillance. The colonoscopy date will be                            determined after pathology results from today's                            exam become available for review. Gatha Mayer, MD 10/30/2017 3:41:11 PM This report has been signed electronically.

## 2017-10-30 NOTE — Progress Notes (Signed)
Pt's states no medical or surgical changes since previsit or office visit. 

## 2017-10-30 NOTE — Patient Instructions (Addendum)
   I found and removed 4 tiny polyps - all look benign. I will let you know pathology results and when to have another routine colonoscopy by mail and/or My Chart.  I appreciate the opportunity to care for you. Gatha Mayer, MD, FACG  YOU HAD AN ENDOSCOPIC PROCEDURE TODAY AT [facility]:   Refer to the procedure report that was given to you for any specific questions about what was found during the examination.  If the procedure report does not answer your questions, please call your gastroenterologist to clarify.  If you requested that your care partner not be given the details of your procedure findings, then the procedure report has been included in a sealed envelope for you to review at your convenience later.  YOU SHOULD EXPECT: Some feelings of bloating in the abdomen. Passage of more gas than usual.  Walking can help get rid of the air that was put into your GI tract during the procedure and reduce the bloating. If you had a lower endoscopy (such as a colonoscopy or flexible sigmoidoscopy) you may notice spotting of blood in your stool or on the toilet paper. If you underwent a bowel prep for your procedure, you may not have a normal bowel movement for a few days.  Please Note:  You might notice some irritation and congestion in your nose or some drainage.  This is from the oxygen used during your procedure.  There is no need for concern and it should clear up in a day or so.  SYMPTOMS TO REPORT IMMEDIATELY:   Following lower endoscopy (colonoscopy or flexible sigmoidoscopy):  Excessive amounts of blood in the stool  Significant tenderness or worsening of abdominal pains  Swelling of the abdomen that is new, acute  Fever of 100F or higher    For urgent or emergent issues, a gastroenterologist can be reached at any hour by calling [phone].   DIET:  We do recommend a small meal at first, but then you may proceed to your regular diet.  Drink plenty of  fluids but you should avoid alcoholic beverages for 24 hours.  ACTIVITY:  You should plan to take it easy for the rest of today and you should NOT DRIVE or use heavy machinery until tomorrow (because of the sedation medicines used during the test).    FOLLOW UP: Our staff will call the number listed on your records the next business day following your procedure to check on you and address any questions or concerns that you may have regarding the information given to you following your procedure. If we do not reach you, we will leave a message.  However, if you are feeling well and you are not experiencing any problems, there is no need to return our call.  We will assume that you have returned to your regular daily activities without incident.  If any biopsies were taken you will be contacted by phone or by letter within the next 1-3 weeks.  Please call us at [phone] if you have not heard about the biopsies in 3 weeks.    SIGNATURES/CONFIDENTIALITY: You and/or your care partner have signed paperwork which will be entered into your electronic medical record.  These signatures attest to the fact that that the information above on your After Visit Summary has been reviewed and is understood.  Full responsibility of the confidentiality of this discharge information lies with you and/or your care-prtner.   Polyp and diverticulosis information given.

## 2017-11-03 MED FILL — PANTOPRAZOLE SOD DR 40 MG T: 40 | 30 days supply | Qty: 60 | Fill #0

## 2017-11-04 ENCOUNTER — Telehealth: Payer: Self-pay | Admitting: *Deleted

## 2017-11-04 ENCOUNTER — Encounter: Payer: Self-pay | Admitting: Internal Medicine

## 2017-11-04 NOTE — Telephone Encounter (Signed)
  Follow up Call-  Call back number 10/30/2017  Post procedure Call Back phone  # [phone]  Permission to leave phone message Yes  Some recent data might be hidden     Patient questions:  Message left to call us if necessary.

## 2017-11-04 NOTE — Telephone Encounter (Signed)
  Follow up Call-  Call back number 10/30/2017  Post procedure Call Back phone  # [phone]  Permission to leave phone message Yes  Some recent data might be hidden     Patient questions:  Do you have a fever, pain , or abdominal swelling? No. Pain Score  0 *  Have you tolerated food without any problems? Yes.    Have you been able to return to your normal activities? Yes.    Do you have any questions about your discharge instructions: Diet   No. Medications  No. Follow up visit  No.  Do you have questions or concerns about your Care? No.  Actions: * If pain score is 4 or above: No action needed, pain <4.

## 2017-11-04 NOTE — Progress Notes (Signed)
4 adenomas Recall 2021 My Chart letter

## 2017-11-11 ENCOUNTER — Encounter: Payer: Self-pay | Admitting: Podiatry

## 2017-11-11 ENCOUNTER — Ambulatory Visit (INDEPENDENT_AMBULATORY_CARE_PROVIDER_SITE_OTHER): Payer: 59 | Admitting: Podiatry

## 2017-11-11 DIAGNOSIS — M722 Plantar fascial fibromatosis: Secondary | ICD-10-CM

## 2017-11-11 NOTE — Progress Notes (Signed)
He presents today for follow-up of his plantar fasciitis of his right heel.  He states that he is approximately 50% improved.  Continues to take his medication and use his plantar fascial brace and his good shoes.  Objective: Vital signs are stable he is alert and oriented x3 pulses remain palpable.  He has no calf pain but does have reproducible pain on palpation of the medial calcaneal tubercle of the right heel.  Assessment: Plantar fasciitis resolved by approximately 50%.  Plan: Discussed etiology pathology conservative versus surgical therapies.  Injected the right heel today after sterile Betadine skin prep 20 mg of Kenalog 5 mg of Marcaine tolerated procedure well with no complications.  Follow-up with him in 1 month if necessary.  Continue all other conservative therapies.

## 2017-11-12 ENCOUNTER — Encounter: Payer: Self-pay | Admitting: Family Medicine

## 2017-11-12 ENCOUNTER — Ambulatory Visit (INDEPENDENT_AMBULATORY_CARE_PROVIDER_SITE_OTHER): Payer: 59 | Admitting: Family Medicine

## 2017-11-12 ENCOUNTER — Ambulatory Visit (INDEPENDENT_AMBULATORY_CARE_PROVIDER_SITE_OTHER)
Admission: RE | Admit: 2017-11-12 | Discharge: 2017-11-12 | Disposition: A | Discharge status: Home / Self Care | Payer: 59 | Source: Ambulatory Visit | Attending: Family Medicine | Admitting: Family Medicine

## 2017-11-12 VITALS — BP 130/82 | HR 69 | Ht 70.0 in | Wt 203.0 lb

## 2017-11-12 DIAGNOSIS — M1711 Unilateral primary osteoarthritis, right knee: Secondary | ICD-10-CM | POA: Diagnosis not present

## 2017-11-12 DIAGNOSIS — M23321 Other meniscus derangements, posterior horn of medial meniscus, right knee: Secondary | ICD-10-CM

## 2017-11-12 DIAGNOSIS — G8929 Other chronic pain: Secondary | ICD-10-CM

## 2017-11-12 DIAGNOSIS — M179 Osteoarthritis of knee, unspecified: Secondary | ICD-10-CM | POA: Diagnosis not present

## 2017-11-12 DIAGNOSIS — M25561 Pain in right knee: Secondary | ICD-10-CM

## 2017-11-12 DIAGNOSIS — M25562 Pain in left knee: Secondary | ICD-10-CM | POA: Diagnosis not present

## 2017-11-12 NOTE — Assessment & Plan Note (Signed)
Patient given an injection today and tolerated the procedure well with some good resolution of pain.  Neuritis.  This could be worsening of the degenerative meniscal tear as well.  Patient does not want any surgical intervention.  Home exercises encouraged and patient was given a brace for stability.  Patient will come back in 4 weeks we will see how patient is responding.

## 2017-11-12 NOTE — Patient Instructions (Signed)
Good to see you  Overall keep it up  Xrays downstairs Injected the knee today  Start the exercises again  Ice 20 minutes 2 times daily. Usually after activity and before bed. See me again in 3-4 weeks

## 2017-11-12 NOTE — Progress Notes (Signed)
[name] James [name], [place] [address] Phone: [phone] Subjective:    I'm seeing this patient by the request  of:    CC: Right knee pain follow-up  PPJ:[id number]  James [name] is a 61 y.o. male coming in with complaint of bilateral knee pain. The right is worse than the left.  Onset- 2017 Location- Medial Duration- All day Character- Sharp Aggravating factors-  Reliving factors-  Therapies tried-  Severity-8 out of 10 and worsening.  Patient had been seen by me back in 2015 and did have a degenerative meniscal tear as well as what appeared to be a small OCD.  This was independently visualized by me again today.  Patient states that the pain has been unrelenting and is giving out on him.  His falling or is going to fall.     Past Medical History:  Diagnosis Date  . Adenomatous colon polyp 2010  . CAD (coronary artery disease)   . Duodenal ulcer due to Helicobacter pylori 80/99/8338  . Gastric ulcer due to Helicobacter pylori 12/29/537  . Hx of adenomatous colonic polyps 01/26/2009  . Hyperlipidemia   . MI (myocardial infarction) ([place])   . Tinea versicolor    Past Surgical History:  Procedure Laterality Date  . COLONOSCOPY W/ POLYPECTOMY    . CORONARY STENT PLACEMENT     at [facility]  . jaw tumor Left   . TONSILLECTOMY  1962   Social History   Socioeconomic History  . Marital status: Married    Spouse name: None  . Number of children: 2  . Years of education: None  . Highest education level: None  Social Needs  . Financial resource strain: None  . Food insecurity - worry: None  . Food insecurity - inability: None  . Transportation needs - medical: None  . Transportation needs - non-medical: None  Occupational History  . Occupation: Passenger transport manager: [employer]  Tobacco Use  . Smoking status: Former Smoker    Last attempt to quit: 11/24/1978    Years since quitting: 38.9  .  Smokeless tobacco: Never Used  Substance and Sexual Activity  . Alcohol use: Yes    Comment: 1 to 2 beers a month  . Drug use: No  . Sexual activity: None  Other Topics Concern  . None  Social History Narrative   Former minor league shortstop   No Known Allergies Family History  Problem Relation Age of Onset  . Colon cancer Neg Hx   . Stomach cancer Neg Hx   . Esophageal cancer Neg Hx   . Pancreatic cancer Neg Hx   . Rectal cancer Neg Hx      Past medical history, social, surgical and family history all reviewed in electronic medical record.  No pertanent information unless stated regarding to the chief complaint.   Review of Systems:Review of systems updated and as accurate as of 11/12/17  No headache, visual changes, nausea, vomiting, diarrhea, constipation, dizziness, abdominal pain, skin rash, fevers, chills, night sweats, weight loss, swollen lymph nodes, body aches, joint swelling, muscle aches, chest pain, shortness of breath, mood changes.   Objective  Blood pressure 130/82, pulse 69, height 5\' 10"  (1.778 m), weight 203 lb (92.1 kg), SpO2 93 %. Systems examined below as of 11/12/17   General: No apparent distress alert and oriented x3 mood and affect normal, dressed appropriately.  HEENT: Pupils equal, extraocular movements intact  Respiratory: Patient's speak in  full sentences and does not appear short of breath  Cardiovascular: No lower extremity edema, non tender, no erythema  Skin: Warm dry intact with no signs of infection or rash on extremities or on axial skeleton.  Abdomen: Soft nontender  Neuro: Cranial nerves II through XII are intact, neurovascularly intact in all extremities with 2+ DTRs and 2+ pulses.  Lymph: No lymphadenopathy of posterior or anterior cervical chain or axillae bilaterally.  Gait normal with good balance and coordination.  MSK:  Non tender with full range of motion and good stability and symmetric strength and tone of shoulders, elbows,  wrist, hip, and ankles bilaterally.  Knee: Right valgus deformity noted.  Abnormal thigh to calf ratio.  Tender to palpation over medial and PF joint line.  ROM full in flexion and extension and lower leg rotation. instability with valgus force.  painful patellar compression.  Positive McMurray still noted Patellar glide with mild crepitus. Patellar and quadriceps tendons unremarkable. Hamstring and quadriceps strength is normal. Contralateral knee shows minimal arthritic changes  After informed written and verbal consent, patient was seated on exam table. Right knee was prepped with alcohol swab and utilizing anterolateral approach, patient's right knee space was injected with 4:1  marcaine 0.5%: Kenalog 40mg /dL. Patient tolerated the procedure well without immediate complications.   Impression and Recommendations:     This case required medical decision making of moderate complexity.      Note: This dictation was prepared with Dragon dictation along with smaller phrase technology. Any transcriptional errors that result from this process are unintentional.

## 2017-11-20 DIAGNOSIS — H5213 Myopia, bilateral: Secondary | ICD-10-CM | POA: Diagnosis not present

## 2017-11-20 DIAGNOSIS — H52203 Unspecified astigmatism, bilateral: Secondary | ICD-10-CM | POA: Diagnosis not present

## 2017-12-09 ENCOUNTER — Ambulatory Visit: Payer: 59 | Admitting: Podiatry

## 2017-12-10 ENCOUNTER — Ambulatory Visit: Payer: 59 | Admitting: Family Medicine

## 2017-12-15 MED FILL — PANTOPRAZOLE SOD DR 40 MG T: 40 | 30 days supply | Qty: 60 | Fill #1

## 2018-01-15 DIAGNOSIS — I252 Old myocardial infarction: Secondary | ICD-10-CM | POA: Diagnosis not present

## 2018-01-15 DIAGNOSIS — I2510 Atherosclerotic heart disease of native coronary artery without angina pectoris: Secondary | ICD-10-CM | POA: Diagnosis not present

## 2018-01-18 ENCOUNTER — Encounter: Payer: Self-pay | Admitting: Internal Medicine

## 2018-01-25 MED FILL — PANTOPRAZOLE SOD DR 40 MG T: 40 | 30 days supply | Qty: 60 | Fill #2

## 2018-01-25 MED FILL — ATORVASTATIN 80 MG TABLET: 80 | 30 days supply | Qty: 30 | Fill #0

## 2018-03-16 ENCOUNTER — Encounter: Payer: Self-pay | Admitting: Internal Medicine

## 2018-03-16 MED ORDER — PANTOPRAZOLE SODIUM 20 MG PO TBEC: 20.0000 mg | DELAYED_RELEASE_TABLET | Freq: Every day | ORAL | 1 refills | Status: DC

## 2018-03-16 MED FILL — PANTOPRAZOLE SOD DR 20 MG T: 20 | 90 days supply | Qty: 90 | Fill #0

## 2018-03-17 MED ORDER — FLUCONAZOLE 150 MG PO TABS: 150.0000 mg | ORAL_TABLET | ORAL | 5 refills | Status: DC

## 2018-03-17 MED FILL — FLUCONAZOLE 150 MG TABS: 150 | 30 days supply | Qty: 4 | Fill #0

## 2018-05-13 ENCOUNTER — Encounter: Payer: Self-pay | Admitting: Internal Medicine

## 2018-05-13 ENCOUNTER — Ambulatory Visit (INDEPENDENT_AMBULATORY_CARE_PROVIDER_SITE_OTHER): Payer: 59 | Admitting: Internal Medicine

## 2018-05-13 VITALS — BP 112/74 | HR 60 | Temp 98.2°F | Ht 69.25 in | Wt 197.0 lb

## 2018-05-13 DIAGNOSIS — K259 Gastric ulcer, unspecified as acute or chronic, without hemorrhage or perforation: Secondary | ICD-10-CM

## 2018-05-13 DIAGNOSIS — B9681 Helicobacter pylori [H. pylori] as the cause of diseases classified elsewhere: Secondary | ICD-10-CM | POA: Diagnosis not present

## 2018-05-13 DIAGNOSIS — Z0000 Encounter for general adult medical examination without abnormal findings: Secondary | ICD-10-CM

## 2018-05-13 DIAGNOSIS — Z23 Encounter for immunization: Secondary | ICD-10-CM

## 2018-05-13 DIAGNOSIS — I2510 Atherosclerotic heart disease of native coronary artery without angina pectoris: Secondary | ICD-10-CM

## 2018-05-13 DIAGNOSIS — B360 Pityriasis versicolor: Secondary | ICD-10-CM | POA: Diagnosis not present

## 2018-05-13 DIAGNOSIS — Z125 Encounter for screening for malignant neoplasm of prostate: Secondary | ICD-10-CM | POA: Diagnosis not present

## 2018-05-13 MED ORDER — FLUCONAZOLE 150 MG PO TABS: 150.0000 mg | ORAL_TABLET | ORAL | 5 refills | Status: DC

## 2018-05-13 MED ORDER — NITROGLYCERIN 0.4 MG SL SUBL: 0.4000 mg | SUBLINGUAL_TABLET | SUBLINGUAL | 1 refills | Status: DC | PRN

## 2018-05-13 NOTE — Progress Notes (Signed)
Subjective:    Patient ID: James [name], male    DOB: 11-11-1956, 62 y.o.   MRN: [id number]  HPI Here for physical  No problems since the GI bleed Continues on the pantoprazole  No heart problems Retired now Does a little working Radio producer  Current Outpatient Medications on File Prior to Visit  Medication Sig Dispense Refill  . aspirin EC 81 MG tablet Take 81 mg by mouth daily.    Marland Kitchen atorvastatin (LIPITOR) 80 MG tablet Take 80 mg by mouth daily.    . Coenzyme Q10-Fish Oil-Vit E (CO-Q 10 OMEGA-3 FISH OIL PO) Take 1 tablet by mouth daily.    . fluconazole (DIFLUCAN) 150 MG tablet Take 1 tablet (150 mg total) by mouth once a week. As needed for recurrent rash 4 tablet 5  . nitroGLYCERIN (NITROSTAT) 0.4 MG SL tablet Place 0.4 mg under the tongue every 5 (five) minutes as needed for chest pain.    . pantoprazole (PROTONIX) 20 MG tablet Take 1 tablet (20 mg total) by mouth daily. 90 tablet 1   No current facility-administered medications on file prior to visit.     No Known Allergies  Past Medical History:  Diagnosis Date  . Adenomatous colon polyp 2010  . CAD (coronary artery disease)   . Duodenal ulcer due to Helicobacter pylori 53/61/4431  . Gastric ulcer due to Helicobacter pylori 03/27/85  . Hx of adenomatous colonic polyps 01/26/2009  . Hyperlipidemia   . MI (myocardial infarction) ([place])   . Tinea versicolor     Past Surgical History:  Procedure Laterality Date  . COLONOSCOPY W/ POLYPECTOMY    . CORONARY STENT PLACEMENT     at [facility]  . jaw tumor Left   . TONSILLECTOMY  1962    Family History  Problem Relation Age of Onset  . Colon cancer Neg Hx   . Stomach cancer Neg Hx   . Esophageal cancer Neg Hx   . Pancreatic cancer Neg Hx   . Rectal cancer Neg Hx     Social History   Socioeconomic History  . Marital status: Married    Spouse name: Not on file  . Number of children: 2  . Years of education: Not on file  . Highest education level:  Not on file  Occupational History  . Occupation: Passenger transport manager: [employer]    Comment: Retired  Scientific laboratory technician  . Financial resource strain: Not on file  . Food insecurity:    Worry: Not on file    Inability: Not on file  . Transportation needs:    Medical: Not on file    Non-medical: Not on file  Tobacco Use  . Smoking status: Former Smoker    Last attempt to quit: 11/24/1978    Years since quitting: 39.4  . Smokeless tobacco: Never Used  Substance and Sexual Activity  . Alcohol use: Yes    Comment: 1 to 2 beers a month  . Drug use: No  . Sexual activity: Not on file  Lifestyle  . Physical activity:    Days per week: Not on file    Minutes per session: Not on file  . Stress: Not on file  Relationships  . Social connections:    Talks on phone: Not on file    Gets together: Not on file    Attends religious service: Not on file    Active member of club or organization: Not on file  Attends meetings of clubs or organizations: Not on file    Relationship status: Not on file  . Intimate partner violence:    Fear of current or ex partner: Not on file    Emotionally abused: Not on file    Physically abused: Not on file    Forced sexual activity: Not on file  Other Topics Concern  . Not on file  Social History Narrative   Former minor league shortstop   Review of Systems  Constitutional: Negative for fatigue and unexpected weight change.       Wears seat belt  HENT: Negative for dental problem and tinnitus.        Still trouble hearing with competing sounds Keeps up with dentist  Eyes:       Gets cloudiness in left eye---slightly worse. Better with contacts in Keeps up with eye doctor  Respiratory: Negative for cough, chest tightness and shortness of breath.   Cardiovascular: Negative for chest pain, palpitations and leg swelling.  Gastrointestinal: Negative for abdominal pain and blood in stool.  Endocrine: Negative for polydipsia and  polyuria.  Genitourinary: Positive for frequency.       Stream is fine. Slight dribbling Nocturia only occasionally No sexual problems  Musculoskeletal: Negative for arthralgias, back pain and joint swelling.  Skin: Positive for rash.       No suspicious lesions  Allergic/Immunologic: Positive for environmental allergies. Negative for immunocompromised state.       Some symptoms lately--AM congestion  Neurological: Negative for dizziness, syncope, light-headedness and headaches.       Rare brief foot tingle  Hematological: Negative for adenopathy. Does not bruise/bleed easily.  Psychiatric/Behavioral: Negative for dysphoric mood. The patient is not nervous/anxious.        Some sleep problems recently MIL with dementia is living with them       Objective:   Physical Exam  Constitutional: He is oriented to person, place, and time. He appears well-developed. No distress.  HENT:  Head: Normocephalic and atraumatic.  Right Ear: External ear normal.  Left Ear: External ear normal.  Mouth/Throat: Oropharynx is clear and moist. No oropharyngeal exudate.  Eyes: Pupils are equal, round, and reactive to light. Conjunctivae are normal.  Neck: No thyromegaly present.  Cardiovascular: Normal rate, regular rhythm, normal heart sounds and intact distal pulses. Exam reveals no gallop.  No murmur heard. Respiratory: Effort normal and breath sounds normal. No respiratory distress. He has no wheezes. He has no rales.  GI: Soft. There is no tenderness.  Musculoskeletal: He exhibits no edema or tenderness.  Lymphadenopathy:    He has no cervical adenopathy.  Neurological: He is alert and oriented to person, place, and time.  Skin: No erythema.  Psychiatric: He has a normal mood and affect. His behavior is normal.           Assessment & Plan:

## 2018-05-13 NOTE — Assessment & Plan Note (Signed)
No symptoms Statin and ASA BP on low side so no ACEI or beta blocker

## 2018-05-13 NOTE — Assessment & Plan Note (Signed)
Quiet Remains on the PPI

## 2018-05-13 NOTE — Addendum Note (Signed)
Addended by: Pilar Grammes on: 05/13/2018 04:47 PM   Modules accepted: Orders

## 2018-05-13 NOTE — Addendum Note (Signed)
Addended by: Pilar Grammes on: 05/13/2018 04:26 PM   Modules accepted: Orders

## 2018-05-13 NOTE — Assessment & Plan Note (Signed)
Weekly med

## 2018-05-13 NOTE — Assessment & Plan Note (Signed)
Healthy Due for tetanus booster Colon due 2021 Discussed fitness Will check PSA after discussion

## 2018-05-14 LAB — CBC
HCT: 44.4 % (ref 39.0–52.0)
Hemoglobin: 14.6 g/dL (ref 13.0–17.0)
MCHC: 32.8 g/dL (ref 30.0–36.0)
MCV: 90.2 fl (ref 78.0–100.0)
PLATELETS: 226 10*3/uL (ref 150.0–400.0)
RBC: 4.92 Mil/uL (ref 4.22–5.81)
RDW: 12.8 % (ref 11.5–15.5)
WBC: 5 10*3/uL (ref 4.0–10.5)

## 2018-05-14 LAB — LIPID PANEL
CHOL/HDL RATIO: 4
Cholesterol: 145 mg/dL (ref 0–200)
HDL: 38 mg/dL — ABNORMAL LOW (ref >39.00)
LDL Cholesterol: 81 mg/dL (ref 0–99)
NONHDL: 107.44
TRIGLYCERIDES: 134 mg/dL (ref 0.0–149.0)
VLDL: 26.8 mg/dL (ref 0.0–40.0)

## 2018-05-14 LAB — COMPREHENSIVE METABOLIC PANEL
ALT: 21 U/L (ref 0–53)
AST: 23 U/L (ref 0–37)
Albumin: 4.7 g/dL (ref 3.5–5.2)
Alkaline Phosphatase: 84 U/L (ref 39–117)
BILIRUBIN TOTAL: 0.4 mg/dL (ref 0.2–1.2)
BUN: 13 mg/dL (ref 6–23)
CALCIUM: 9.9 mg/dL (ref 8.4–10.5)
CO2: 30 meq/L (ref 19–32)
Chloride: 104 mEq/L (ref 96–112)
Creatinine, Ser: 1.22 mg/dL (ref 0.40–1.50)
GFR: 76.91 mL/min (ref >60.00)
Glucose, Bld: 99 mg/dL (ref 70–99)
Potassium: 3.7 mEq/L (ref 3.5–5.1)
Sodium: 142 mEq/L (ref 135–145)
TOTAL PROTEIN: 7.1 g/dL (ref 6.0–8.3)

## 2018-05-14 LAB — PSA: PSA: 1.16 ng/mL (ref 0.10–4.00)

## 2018-07-10 IMAGING — DX DG KNEE STANDING AP BILAT
1 series · 1 of 1 positions shown · non-contrast
Comparison: Right knee series dated July 03, 2014

CLINICAL DATA: Ionic bilateral knee pain right greater than left.

EXAM:
BILATERAL KNEES STANDING - 1 VIEW

[knee ap]
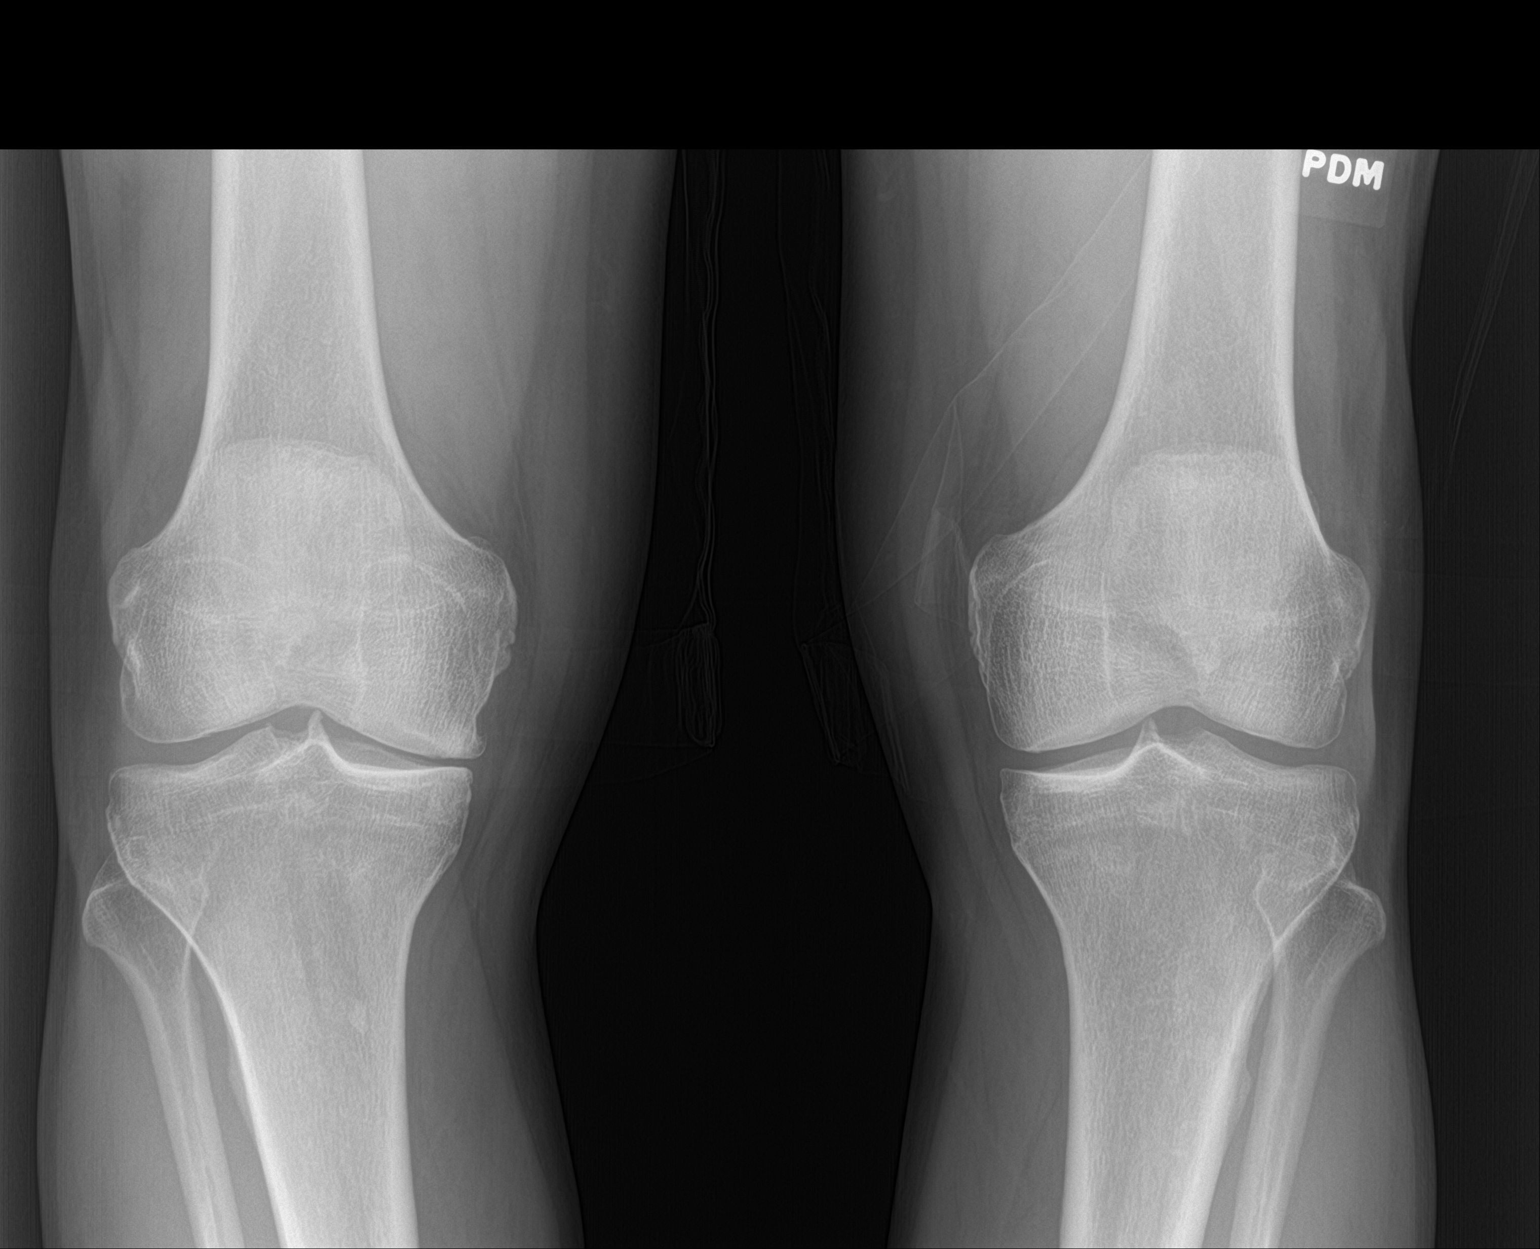

[1 of 1 positions shown; findings below may reference images not displayed]

FINDINGS: Right knee: The bones are subjectively adequately mineralized. There
is moderate narrowing of the week medial joint compartment which has
appeared since the previous study. There is beaking of the tibial
spines greatest medially. A tiny spur arises from the articular
margin of the medial femoral condyle

Left knee: The bones are subjectively adequately mineralized. The
medial and lateral joint spaces are well maintained. There is
beaking of the medial tibial spine. No acute or healing fracture is
observed.
IMPRESSION: Moderate degenerative narrowing of the right knee centered on the
medial joint compartment. Mild beaking of the tibial spines
bilaterally greatest medially.

## 2018-08-13 DIAGNOSIS — I2510 Atherosclerotic heart disease of native coronary artery without angina pectoris: Secondary | ICD-10-CM | POA: Diagnosis not present

## 2018-08-29 MED FILL — PANTOPRAZOLE SOD DR 20 MG T: 20 | 90 days supply | Qty: 90 | Fill #1

## 2018-09-29 MED FILL — ATORVASTATIN 40 MG TABLET: 40 | 30 days supply | Qty: 15 | Fill #0

## 2018-11-12 MED FILL — ATORVASTATIN CALCIUM 40 MG: 40 | 90 days supply | Qty: 45 | Fill #0

## 2018-12-15 ENCOUNTER — Other Ambulatory Visit: Payer: Self-pay | Admitting: Internal Medicine

## 2018-12-15 MED FILL — PANTOPRAZOLE SOD DR 20 MG T: 20 | 90 days supply | Qty: 90 | Fill #0

## 2018-12-16 ENCOUNTER — Ambulatory Visit: Payer: 59 | Admitting: Family Medicine

## 2019-04-28 MED FILL — ATORVASTATIN 40 MG TABLET: 40 | 90 days supply | Qty: 45 | Fill #1

## 2019-06-16 ENCOUNTER — Ambulatory Visit (INDEPENDENT_AMBULATORY_CARE_PROVIDER_SITE_OTHER): Payer: Medicare Other | Admitting: Internal Medicine

## 2019-06-16 ENCOUNTER — Other Ambulatory Visit: Payer: Self-pay

## 2019-06-16 ENCOUNTER — Encounter: Payer: 59 | Admitting: Internal Medicine

## 2019-06-16 ENCOUNTER — Encounter: Payer: Self-pay | Admitting: Internal Medicine

## 2019-06-16 VITALS — BP 118/80 | HR 70 | Temp 98.5°F | Ht 69.25 in | Wt 216.0 lb

## 2019-06-16 DIAGNOSIS — M159 Polyosteoarthritis, unspecified: Secondary | ICD-10-CM | POA: Diagnosis not present

## 2019-06-16 DIAGNOSIS — Z0000 Encounter for general adult medical examination without abnormal findings: Secondary | ICD-10-CM | POA: Diagnosis not present

## 2019-06-16 DIAGNOSIS — Z7189 Other specified counseling: Secondary | ICD-10-CM | POA: Insufficient documentation

## 2019-06-16 DIAGNOSIS — B9681 Helicobacter pylori [H. pylori] as the cause of diseases classified elsewhere: Secondary | ICD-10-CM

## 2019-06-16 DIAGNOSIS — I2510 Atherosclerotic heart disease of native coronary artery without angina pectoris: Secondary | ICD-10-CM | POA: Diagnosis not present

## 2019-06-16 DIAGNOSIS — K269 Duodenal ulcer, unspecified as acute or chronic, without hemorrhage or perforation: Secondary | ICD-10-CM | POA: Diagnosis not present

## 2019-06-16 DIAGNOSIS — B360 Pityriasis versicolor: Secondary | ICD-10-CM

## 2019-06-16 MED ORDER — FLUCONAZOLE 150 MG PO TABS: 150.0000 mg | ORAL_TABLET | ORAL | 5 refills | Status: DC

## 2019-06-16 NOTE — Assessment & Plan Note (Signed)
See social history 

## 2019-06-16 NOTE — Assessment & Plan Note (Signed)
Years ago Will have him try without the PPI

## 2019-06-16 NOTE — Assessment & Plan Note (Signed)
I have personally reviewed the Medicare Annual Wellness questionnaire and have noted 1. The patient's medical and social history 2. Their use of alcohol, tobacco or illicit drugs 3. Their current medications and supplements 4. The patient's functional ability including ADL's, fall risks, home safety risks and hearing or visual             impairment. 5. Diet and physical activities 6. Evidence for depression or mood disorders  The patients weight, height, BMI and visual acuity have been recorded in the chart I have made referrals, counseling and provided education to the patient based review of the above and I have provided the pt with a written personalized care plan for preventive services.  I have provided you with a copy of your personalized plan for preventive services. Please take the time to review along with your updated medication list.  Refuses flu vaccine Doesn't want pneumovax--but will research and consider Discussed fitness Colon due next year Consider PSA again next year

## 2019-06-16 NOTE — Assessment & Plan Note (Signed)
Sees cardiology Statin and ASA

## 2019-06-16 NOTE — Progress Notes (Signed)
Hearing Screening   Method: Audiometry   125Hz  250Hz  500Hz  1000Hz  2000Hz  3000Hz  4000Hz  6000Hz  8000Hz   Right ear:   20 20 20  20     Left ear:   20 20 20  20       Visual Acuity Screening   Right eye Left eye Both eyes  Without correction:     With correction: 20/20 20/20 20/20

## 2019-06-16 NOTE — Assessment & Plan Note (Signed)
Knee Some in hands, etc Discussed tylenol

## 2019-06-16 NOTE — Assessment & Plan Note (Signed)
Does fine with occasional fluconazole

## 2019-06-16 NOTE — Progress Notes (Signed)
Subjective:    Patient ID: James [name], male    DOB: 01-19-1956, 63 y.o.   MRN: [id number]  HPI Here for Welcome to Medicare visit and follow up of chronic health conditions Reviewed form and advanced directives Reviewed advanced directives No tobacco--only smoked as teenager Occasional beer or wine (or liquor) Vision is okay with correction Some concerns about his hearing--thinks there may be wax buildup No falls No depression or anhedonia No hospitalizations or surgery Independent with instrumental ADLs No memory problems  Has gained weight ---relates to COVID Hasn't been exercising---hoping to do more golfing, etc Discussed healthy eating  Having persistent problems with right 4th PIP---persistently swollen No known injury and not really painful No medications thus far Some chronic issues with right knee---and some transient pain in right foot (usually improves after a few steps) Now has knot on DIP of left thumb  No recent troubles with his heart No chest pain No SOB No dizziness or syncope No palpitations No edema  Still gets tinea versicolor Uses weekly fluconazole several times a year  No heartburn  No dysphagia Still on the protonix but every other day  Stress with caring for MIL with dementia--she lives with them  Current Outpatient Medications on File Prior to Visit  Medication Sig Dispense Refill  . aspirin EC 81 MG tablet Take 81 mg by mouth daily.    Marland Kitchen atorvastatin (LIPITOR) 80 MG tablet Take 80 mg by mouth daily.    . Coenzyme Q10-Fish Oil-Vit E (CO-Q 10 OMEGA-3 FISH OIL PO) Take 1 tablet by mouth daily.    . fluconazole (DIFLUCAN) 150 MG tablet Take 1 tablet (150 mg total) by mouth once a week. As needed for recurrent rash 4 tablet 5  . nitroGLYCERIN (NITROSTAT) 0.4 MG SL tablet Place 1 tablet (0.4 mg total) under the tongue every 5 (five) minutes as needed for chest pain. 25 tablet 1  . pantoprazole (PROTONIX) 20 MG tablet TAKE 1 TABLET BY MOUTH  ONCE DAILY 90 tablet 1   No current facility-administered medications on file prior to visit.     No Known Allergies  Past Medical History:  Diagnosis Date  . Adenomatous colon polyp 2010  . CAD (coronary artery disease)   . Duodenal ulcer due to Helicobacter pylori 35/32/9924  . Gastric ulcer due to Helicobacter pylori 12/31/8339  . Hx of adenomatous colonic polyps 01/26/2009  . Hyperlipidemia   . MI (myocardial infarction) ([place])   . Tinea versicolor     Past Surgical History:  Procedure Laterality Date  . COLONOSCOPY W/ POLYPECTOMY    . CORONARY STENT PLACEMENT     at [facility]  . jaw tumor Left   . TONSILLECTOMY  1962    Family History  Problem Relation Age of Onset  . Colon cancer Neg Hx   . Stomach cancer Neg Hx   . Esophageal cancer Neg Hx   . Pancreatic cancer Neg Hx   . Rectal cancer Neg Hx     Social History   Socioeconomic History  . Marital status: Married    Spouse name: Not on file  . Number of children: 2  . Years of education: Not on file  . Highest education level: Not on file  Occupational History  . Occupation: Passenger transport manager: [employer]    Comment: Retired  Scientific laboratory technician  . Financial resource strain: Not on file  . Food insecurity    Worry: Not on file  Inability: Not on file  . Transportation needs    Medical: Not on file    Non-medical: Not on file  Tobacco Use  . Smoking status: Former Smoker    Quit date: 11/24/1978    Years since quitting: 40.5  . Smokeless tobacco: Never Used  Substance and Sexual Activity  . Alcohol use: Yes    Comment: 1 to 2 beers a month  . Drug use: No  . Sexual activity: Not on file  Lifestyle  . Physical activity    Days per week: Not on file    Minutes per session: Not on file  . Stress: Not on file  Relationships  . Social Herbalist on phone: Not on file    Gets together: Not on file    Attends religious service: Not on file    Active member of club  or organization: Not on file    Attends meetings of clubs or organizations: Not on file    Relationship status: Not on file  . Intimate partner violence    Fear of current or ex partner: Not on file    Emotionally abused: Not on file    Physically abused: Not on file    Forced sexual activity: Not on file  Other Topics Concern  . Not on file  Social History Narrative   Former minor league shortstop   Review of Systems Rare headache No problems with appetite  Gained near 20#!! Sleeps well Wears seat belt Having trouble with teeth on right side--lost a crown, etc. Needs new dentist Bowels fine--no blood Voids fine. Nocturia x 1-2. No problems emptying  No suspicious skin lesions    Objective:   Physical Exam  Constitutional: He is oriented to person, place, and time. He appears well-developed. No distress.  HENT:  Mouth/Throat: Oropharynx is clear and moist. No oropharyngeal exudate.  Cerumen impaction on right  Neck: No thyromegaly present.  Cardiovascular: Normal rate, regular rhythm, normal heart sounds and intact distal pulses. Exam reveals no gallop.  No murmur heard. Respiratory: Effort normal and breath sounds normal. No respiratory distress. He has no wheezes. He has no rales.  GI: Soft. There is no abdominal tenderness.  Musculoskeletal:        General: No tenderness or edema.     Comments: Thickening without inflammation right 4th PIP  Lymphadenopathy:    He has no cervical adenopathy.  Neurological: He is alert and oriented to person, place, and time.  President--- "Daisy Floro, Obama, Bush" [phone] [id number] Recall 3/3  Skin: No rash noted. No erythema.  Psychiatric: He has a normal mood and affect. His behavior is normal.           Assessment & Plan:

## 2019-06-16 NOTE — Patient Instructions (Addendum)
You can try stopping the pantoprazole (acid blocker). If you have any abdominal pain or heartburn, you can start it again. Please research pneumovax vaccine and hopefully you will agree to get this the next time.  DASH Eating Plan DASH stands for "Dietary Approaches to Stop Hypertension." The DASH eating plan is a healthy eating plan that has been shown to reduce high blood pressure (hypertension). It may also reduce your risk for type 2 diabetes, heart disease, and stroke. The DASH eating plan may also help with weight loss. What are tips for following this plan?  General guidelines  Avoid eating more than 2,300 mg (milligrams) of salt (sodium) a day. If you have hypertension, you may need to reduce your sodium intake to 1,500 mg a day.  Limit alcohol intake to no more than 1 drink a day for nonpregnant women and 2 drinks a day for men. One drink equals 12 oz of beer, 5 oz of wine, or 1 oz of hard liquor.  Work with your health care provider to maintain a healthy body weight or to lose weight. Ask what an ideal weight is for you.  Get at least 30 minutes of exercise that causes your heart to beat faster (aerobic exercise) most days of the week. Activities may include walking, swimming, or biking.  Work with your health care provider or diet and nutrition specialist (dietitian) to adjust your eating plan to your individual calorie needs. Reading food labels   Check food labels for the amount of sodium per serving. Choose foods with less than 5 percent of the Daily Value of sodium. Generally, foods with less than 300 mg of sodium per serving fit into this eating plan.  To find whole grains, look for the word "whole" as the first word in the ingredient list. Shopping  Buy products labeled as "low-sodium" or "no salt added."  Buy fresh foods. Avoid canned foods and premade or frozen meals. Cooking  Avoid adding salt when cooking. Use salt-free seasonings or herbs instead of table salt or  sea salt. Check with your health care provider or pharmacist before using salt substitutes.  Do not fry foods. Cook foods using healthy methods such as baking, boiling, grilling, and broiling instead.  Cook with heart-healthy oils, such as olive, canola, soybean, or sunflower oil. Meal planning  Eat a balanced diet that includes: ? 5 or more servings of fruits and vegetables each day. At each meal, try to fill half of your plate with fruits and vegetables. ? Up to 6-8 servings of whole grains each day. ? Less than 6 oz of lean meat, poultry, or fish each day. A 3-oz serving of meat is about the same size as a deck of cards. One egg equals 1 oz. ? 2 servings of low-fat dairy each day. ? A serving of nuts, seeds, or beans 5 times each week. ? Heart-healthy fats. Healthy fats called Omega-3 fatty acids are found in foods such as flaxseeds and coldwater fish, like sardines, salmon, and mackerel.  Limit how much you eat of the following: ? Canned or prepackaged foods. ? Food that is high in trans fat, such as fried foods. ? Food that is high in saturated fat, such as fatty meat. ? Sweets, desserts, sugary drinks, and other foods with added sugar. ? Full-fat dairy products.  Do not salt foods before eating.  Try to eat at least 2 vegetarian meals each week.  Eat more home-cooked food and less restaurant, buffet, and fast food.  When eating at a restaurant, ask that your food be prepared with less salt or no salt, if possible. What foods are recommended? The items listed may not be a complete list. Talk with your dietitian about what dietary choices are best for you. Grains Whole-grain or whole-wheat bread. Whole-grain or whole-wheat pasta. Brown rice. James [name]. Bulgur. Whole-grain and low-sodium cereals. Pita bread. Low-fat, low-sodium crackers. Whole-wheat flour tortillas. Vegetables Fresh or frozen vegetables (raw, steamed, roasted, or grilled). Low-sodium or reduced-sodium  tomato and vegetable juice. Low-sodium or reduced-sodium tomato sauce and tomato paste. Low-sodium or reduced-sodium canned vegetables. Fruits All fresh, dried, or frozen fruit. Canned fruit in natural juice (without added sugar). Meat and other protein foods Skinless chicken or [place]. Ground chicken or [place]. Pork with fat trimmed off. Fish and seafood. Egg whites. Dried beans, peas, or lentils. Unsalted nuts, nut butters, and seeds. Unsalted canned beans. Lean cuts of beef with fat trimmed off. Low-sodium, lean deli meat. Dairy Low-fat (1%) or fat-free (skim) milk. Fat-free, low-fat, or reduced-fat cheeses. Nonfat, low-sodium ricotta or cottage cheese. Low-fat or nonfat yogurt. Low-fat, low-sodium cheese. Fats and oils Soft margarine without trans fats. Vegetable oil. Low-fat, reduced-fat, or light mayonnaise and salad dressings (reduced-sodium). Canola, safflower, olive, soybean, and sunflower oils. Avocado. Seasoning and other foods Herbs. Spices. Seasoning mixes without salt. Unsalted popcorn and pretzels. Fat-free sweets. What foods are not recommended? The items listed may not be a complete list. Talk with your dietitian about what dietary choices are best for you. Grains Baked goods made with fat, such as croissants, muffins, or some breads. Dry pasta or rice meal packs. Vegetables Creamed or fried vegetables. Vegetables in a cheese sauce. Regular canned vegetables (not low-sodium or reduced-sodium). Regular canned tomato sauce and paste (not low-sodium or reduced-sodium). Regular tomato and vegetable juice (not low-sodium or reduced-sodium). James [name]. Olives. Fruits Canned fruit in a light or heavy syrup. Fried fruit. Fruit in cream or butter sauce. Meat and other protein foods Fatty cuts of meat. Ribs. Fried meat. James [name]. Sausage. Bologna and other processed lunch meats. Salami. Fatback. Hotdogs. Bratwurst. Salted nuts and seeds. Canned beans with added salt. Canned or smoked fish.  Whole eggs or egg yolks. Chicken or [place] with skin. Dairy Whole or 2% milk, cream, and half-and-half. Whole or full-fat cream cheese. Whole-fat or sweetened yogurt. Full-fat cheese. Nondairy creamers. Whipped toppings. Processed cheese and cheese spreads. Fats and oils Butter. Stick margarine. Lard. Shortening. Ghee. Bacon fat. Tropical oils, such as coconut, palm kernel, or palm oil. Seasoning and other foods Salted popcorn and pretzels. Onion salt, garlic salt, seasoned salt, table salt, and sea salt. Worcestershire sauce. Tartar sauce. Barbecue sauce. Teriyaki sauce. Soy sauce, including reduced-sodium. Steak sauce. Canned and packaged gravies. Fish sauce. Oyster sauce. Cocktail sauce. Horseradish that you find on the shelf. Ketchup. Mustard. Meat flavorings and tenderizers. Bouillon cubes. Hot sauce and Tabasco sauce. Premade or packaged marinades. Premade or packaged taco seasonings. Relishes. Regular salad dressings. Where to find more information:  National Heart, Lung, and [place]: https://wilson-eaton.com/  American Heart Association: www.heart.org Summary  The DASH eating plan is a healthy eating plan that has been shown to reduce high blood pressure (hypertension). It may also reduce your risk for type 2 diabetes, heart disease, and stroke.  With the DASH eating plan, you should limit salt (sodium) intake to 2,300 mg a day. If you have hypertension, you may need to reduce your sodium intake to 1,500 mg a day.  When on the DASH eating plan,  aim to eat more fresh fruits and vegetables, whole grains, lean proteins, low-fat dairy, and heart-healthy fats.  Work with your health care provider or diet and nutrition specialist (dietitian) to adjust your eating plan to your individual calorie needs. This information is not intended to replace advice given to you by your health care provider. Make sure you discuss any questions you have with your health care provider. Document Released:  10/30/2011 Document Revised: 10/23/2017 Document Reviewed: 11/03/2016 Elsevier Patient Education  2020 [employer].

## 2019-06-17 LAB — CBC
HCT: 45 % (ref 39.0–52.0)
Hemoglobin: 14.4 g/dL (ref 13.0–17.0)
MCHC: 32.1 g/dL (ref 30.0–36.0)
MCV: 90.8 fl (ref 78.0–100.0)
Platelets: 213 10*3/uL (ref 150.0–400.0)
RBC: 4.96 Mil/uL (ref 4.22–5.81)
RDW: 12.5 % (ref 11.5–15.5)
WBC: 4.7 10*3/uL (ref 4.0–10.5)

## 2019-06-17 LAB — LIPID PANEL
Cholesterol: 171 mg/dL (ref 0–200)
HDL: 36 mg/dL — ABNORMAL LOW (ref >39.00)
LDL Cholesterol: 97 mg/dL (ref 0–99)
NonHDL: 134.85
Total CHOL/HDL Ratio: 5
Triglycerides: 188 mg/dL — ABNORMAL HIGH (ref 0.0–149.0)
VLDL: 37.6 mg/dL (ref 0.0–40.0)

## 2019-06-17 LAB — COMPREHENSIVE METABOLIC PANEL
ALT: 16 U/L (ref 0–53)
AST: 19 U/L (ref 0–37)
Albumin: 4.5 g/dL (ref 3.5–5.2)
Alkaline Phosphatase: 74 U/L (ref 39–117)
BUN: 14 mg/dL (ref 6–23)
CO2: 30 mEq/L (ref 19–32)
Calcium: 10.2 mg/dL (ref 8.4–10.5)
Chloride: 104 mEq/L (ref 96–112)
Creatinine, Ser: 1.15 mg/dL (ref 0.40–1.50)
GFR: 77.2 mL/min (ref >60.00)
Glucose, Bld: 80 mg/dL (ref 70–99)
Potassium: 4.5 mEq/L (ref 3.5–5.1)
Sodium: 142 mEq/L (ref 135–145)
Total Bilirubin: 0.4 mg/dL (ref 0.2–1.2)
Total Protein: 6.7 g/dL (ref 6.0–8.3)

## 2019-08-08 DIAGNOSIS — I2510 Atherosclerotic heart disease of native coronary artery without angina pectoris: Secondary | ICD-10-CM | POA: Diagnosis not present

## 2019-09-19 MED FILL — ATORVASTATIN 40 MG TABLET: 40 | 30 days supply | Qty: 15 | Fill #2

## 2019-11-09 MED FILL — ATORVASTATIN 40 MG TABLET: 40 | 30 days supply | Qty: 15 | Fill #3

## 2019-12-20 MED FILL — ATORVASTATIN 40 MG TABLET: 40 | 90 days supply | Qty: 45 | Fill #0

## 2019-12-21 ENCOUNTER — Ambulatory Visit: Payer: Medicare Other | Admitting: Family Medicine

## 2019-12-21 ENCOUNTER — Other Ambulatory Visit: Payer: Self-pay

## 2019-12-21 VITALS — BP 118/82 | HR 59 | Ht 69.0 in | Wt 216.0 lb

## 2019-12-21 DIAGNOSIS — M7121 Synovial cyst of popliteal space [Baker], right knee: Secondary | ICD-10-CM | POA: Diagnosis not present

## 2019-12-21 DIAGNOSIS — M170 Bilateral primary osteoarthritis of knee: Secondary | ICD-10-CM | POA: Diagnosis not present

## 2019-12-21 NOTE — Telephone Encounter (Signed)
I don't even know where he is seeing this. Can you make these changes?

## 2019-12-21 NOTE — Patient Instructions (Signed)
Vitamin D 2000IU daily Tart cherry 1200mg  at night Ice 20 min 2 x a day See me again in 4 weeks

## 2019-12-21 NOTE — Progress Notes (Signed)
[place] Phone: [phone] Subjective:   James [name], am serving as a scribe for Dr. Hulan Saas. This visit occurred during the SARS-CoV-2 public health emergency.  Safety protocols were in place, including screening questions prior to the visit, additional usage of staff PPE, and extensive cleaning of exam room while observing appropriate contact time as indicated for disinfecting solutions.   I'm seeing this patient by the request  of:  Venia Carbon, MD  CC: Bilateral knee pain, radicular pain  [id number]  James [name] is a 64 y.o. male coming in with complaint of bilateral knee pain. Pain is constant. Patient likes to walk on occasion. Left knee is worse than the right.   Also having right shoulder pain over AC joint. Pain increases with flexion. [name] injury but has pain for past 3 months. Denies any radiating symptoms.       Past Medical History:  Diagnosis Date  . Adenomatous colon polyp 2010  . CAD (coronary artery disease)   . Duodenal ulcer due to Helicobacter pylori 0000000  . Gastric ulcer due to Helicobacter pylori [id number]  . Hx of adenomatous colonic polyps 01/26/2009  . Hyperlipidemia   . MI (myocardial infarction) ([place])   . Tinea versicolor    Past Surgical History:  Procedure Laterality Date  . COLONOSCOPY W/ POLYPECTOMY    . CORONARY STENT PLACEMENT     at [facility]  . jaw tumor Left   . TONSILLECTOMY  1962   Social History   Socioeconomic History  . Marital status: Married    Spouse name: Not on file  . Number of children: 2  . Years of education: Not on file  . Highest education level: Not on file  Occupational History  . 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 Smoking status: Former Smoker    Quit date: 11/24/1978    Years since quitting: 41.1  . Smokeless tobacco: Never Used   Substance and Sexual Activity  . Alcohol use: Yes    Comment: 1 to 2 beers a month  . Drug use: [name]  . Sexual activity: Not on file  Other Topics Concern  . Not on file  Social History Narrative   Former minor league shortstop      Has living will   Wife is health care POA--children would be alternates   Would accept resuscitation   [name] tube feeds if cognitively unaware   Social Determinants of Health   Financial Resource Strain:   . Difficulty of Paying Living Expenses: Not on file  Food Insecurity:   . Worried About Charity fundraiser in the Last Year: Not on file  . Ran Out of Food in the Last Year: Not on file  Transportation Needs:   . Lack of Transportation (Medical): Not on file  . Lack of Transportation (Non-Medical): Not on file  Physical Activity:   . Days of Exercise per Week: Not on file  . Minutes of Exercise per Session: Not on file  Stress:   . Feeling of Stress : Not on file  Social Connections:   . Frequency of Communication with Friends and Family: Not on file  . Frequency of Social Gatherings with Friends and Family: Not on file  . Attends Religious Services: Not on file  . Active Member of Clubs or Organizations: Not on file  . Attends  Club or Organization Meetings: Not on file  . Marital Status: Not on file   [name] Known Allergies Family History  Problem Relation Age of Onset  . Colon cancer Neg Hx   . Stomach cancer Neg Hx   . Esophageal cancer Neg Hx   . Pancreatic cancer Neg Hx   . Rectal cancer Neg Hx      Current Outpatient Medications (Cardiovascular):  .  atorvastatin (LIPITOR) 80 MG tablet, Take 80 mg by mouth daily. .  nitroGLYCERIN (NITROSTAT) 0.4 MG SL tablet, Place 1 tablet (0.4 mg total) under the tongue every 5 (five) minutes as needed for chest pain.   Current Outpatient Medications (Analgesics):  .  aspirin EC 81 MG tablet, Take 81 mg by mouth daily.   Current Outpatient Medications (Other):  Marland Kitchen  Coenzyme Q10-Fish Oil-Vit E  (CO-Q 10 OMEGA-3 FISH OIL PO), Take 1 tablet by mouth daily. .  fluconazole (DIFLUCAN) 150 MG tablet, Take 1 tablet (150 mg total) by mouth once a week. As needed for recurrent rash .  pantoprazole (PROTONIX) 20 MG tablet, TAKE 1 TABLET BY MOUTH ONCE DAILY   Reviewed prior external information including notes and imaging from  primary care provider As well as notes that were available from care everywhere and other healthcare systems.  Past medical history, social, surgical and family history all reviewed in electronic medical record.  [name] pertanent information unless stated regarding to the chief complaint.   Review of Systems:  [name] headache, visual changes, nausea, vomiting, diarrhea, constipation, dizziness, abdominal pain, skin rash, fevers, chills, night sweats, weight loss, swollen lymph nodes, joint swelling, chest pain, shortness of breath, mood changes. POSITIVE muscle aches, body aches  Objective  Blood pressure 118/82, pulse (!) 59, height 5\' 9"  (1.753 m), weight 216 lb (98 kg), SpO2 99 %.   General: [name] apparent distress alert and oriented x3 mood and affect normal, dressed appropriately.  HEENT: Pupils equal, extraocular movements intact  Respiratory: Patient's speak in full sentences and does not appear short of breath  Cardiovascular: [name] lower extremity edema, non tender, [name] erythema  Skin: Warm dry intact with [name] signs of infection or rash on extremities or on axial skeleton.  Abdomen: Soft nontender  Neuro: Cranial nerves II through XII are intact, neurovascularly intact in all extremities with 2+ DTRs and 2+ pulses.  Lymph: [name] lymphadenopathy of posterior or anterior cervical chain or axillae bilaterally.  Gait antalgic MSK:  Knee: Bilateral valgus deformity noted.  Abnormal thigh to calf ratio.  Patient with a large Baker's cyst on the right Tender to palpation over medial and PF joint line.  ROM full in flexion and extension and lower leg rotation. instability with  valgus force.  painful patellar compression. Patellar glide with moderate crepitus. Patellar and quadriceps tendons unremarkable. Hamstring and quadriceps strength is normal.  After informed written and verbal consent, patient was seated on exam table. Right knee was prepped with alcohol swab and utilizing anterolateral approach, patient's right knee space was injected with 4:1  marcaine 0.5%: Kenalog 40mg /dL. Patient tolerated the procedure well without immediate complications.  After informed written and verbal consent, patient was seated on exam table. Left knee was prepped with alcohol swab and utilizing anterolateral approach, patient's left knee space was injected with 4:1  marcaine 0.5%: Kenalog 40mg /dL. Patient tolerated the procedure well without immediate complications.   Impression and Recommendations:     This case required medical decision making of moderate complexity. The above documentation has been reviewed and  is accurate and complete James Pulley, DO       Note: This dictation was prepared with Dragon dictation along with smaller phrase technology. Any transcriptional errors that result from this process are unintentional.

## 2019-12-22 ENCOUNTER — Encounter: Payer: Self-pay | Admitting: Family Medicine

## 2019-12-22 DIAGNOSIS — M170 Bilateral primary osteoarthritis of knee: Secondary | ICD-10-CM | POA: Insufficient documentation

## 2019-12-22 NOTE — Assessment & Plan Note (Signed)
Bilateral injection given, discussed icing regimen and home exercise, discussed avoiding certain activities, increase activity as tolerated.  Discussed topical anti-inflammatories.  Follow-up again in 4 to 6 weeks.  If right knee continues to give [place] difficulty with the Baker's cyst will consider aspiration.

## 2020-01-25 ENCOUNTER — Encounter: Payer: Self-pay | Admitting: Family Medicine

## 2020-01-25 ENCOUNTER — Ambulatory Visit (INDEPENDENT_AMBULATORY_CARE_PROVIDER_SITE_OTHER): Payer: Medicare Other

## 2020-01-25 ENCOUNTER — Other Ambulatory Visit: Payer: Self-pay

## 2020-01-25 ENCOUNTER — Ambulatory Visit: Payer: Medicare Other | Admitting: Family Medicine

## 2020-01-25 VITALS — BP 130/80 | HR 64 | Ht 69.0 in | Wt 225.0 lb

## 2020-01-25 DIAGNOSIS — G8929 Other chronic pain: Secondary | ICD-10-CM

## 2020-01-25 DIAGNOSIS — M25561 Pain in right knee: Secondary | ICD-10-CM

## 2020-01-25 DIAGNOSIS — M170 Bilateral primary osteoarthritis of knee: Secondary | ICD-10-CM | POA: Diagnosis not present

## 2020-01-25 DIAGNOSIS — M7121 Synovial cyst of popliteal space [Baker], right knee: Secondary | ICD-10-CM | POA: Diagnosis not present

## 2020-01-25 NOTE — Progress Notes (Signed)
[place] [address] [place] Phone: [phone] Subjective:   I James [name] am serving as a Education administrator for Dr. Hulan Saas.  This visit occurred during the SARS-CoV-2 public health emergency.  Safety protocols were in place, including screening questions prior to the visit, additional usage of staff PPE, and extensive cleaning of exam room while observing appropriate contact time as indicated for disinfecting solutions.   I'm seeing this patient by the request  of:  Venia Carbon, MD  CC: Knee pain follow-up  [id number]   12/21/2019 Bilateral injection given, discussed icing regimen and home exercise, discussed avoiding certain activities, increase activity as tolerated.  Discussed topical anti-inflammatories.  Follow-up again in 4 to 6 weeks.  If right knee continues to give [place] difficulty with the Baker's cyst will consider aspiration.  Update 01/25/2020 James [name] is a 64 y.o. male coming in with complaint of bilateral knee pain. Patient states the left got better and the right got worse. Right knee has had some swelling.  Patient feels it is more posterior.  Feels like there is a bump in the area.  Since then this seems to be the thing that gives him more of a trouble at the moment.  Can affect walking secondary to the pain.   Patient did have x-rays 2018 showed moderate to severe arthritic changes mostly in the medial compartment  Past Medical History:  Diagnosis Date  . Adenomatous colon polyp 2010  . CAD (coronary artery disease)   . Duodenal ulcer due to Helicobacter pylori 0000000  . Gastric ulcer due to Helicobacter pylori 123XX123  . Hx of adenomatous colonic polyps 01/26/2009  . Hyperlipidemia   . MI (myocardial infarction) ([place])   . Tinea versicolor    Past Surgical History:  Procedure Laterality Date  . COLONOSCOPY W/ POLYPECTOMY    . CORONARY STENT PLACEMENT     at [facility]  . jaw tumor Left   .  TONSILLECTOMY  1962   Social History   Socioeconomic History  . Marital status: Married    Spouse name: Not on file  . Number of children: 2  . Years of education: Not on file  . Highest education level: Not on file  Occupational History  . 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 Smoking status: Former Smoker    Quit date: 11/24/1978    Years since quitting: 41.1  . Smokeless tobacco: Never Used  Substance and Sexual Activity  . Alcohol use: Yes    Comment: 1 to 2 beers a month  . Drug use: No  . Sexual activity: Not on file  Other Topics Concern  . Not on file  Social History Narrative   Former minor league shortstop      Has living will   Wife is health care POA--children would be alternates   Would accept resuscitation   No tube feeds if cognitively unaware   Social Determinants of Health   Financial Resource Strain:   . Difficulty of Paying Living Expenses: Not on file  Food Insecurity:   . Worried About Charity fundraiser in the Last Year: Not on file  . Ran Out of Food in the Last Year: Not on file  Transportation Needs:   . Lack of Transportation (Medical): Not on file  . Lack of Transportation (Non-Medical): Not on file  Physical Activity:   . Days  of Exercise per Week: Not on file  . Minutes of Exercise per Session: Not on file  Stress:   . Feeling of Stress : Not on file  Social Connections:   . Frequency of Communication with Friends and Family: Not on file  . Frequency of Social Gatherings with Friends and Family: Not on file  . Attends Religious Services: Not on file  . Active Member of Clubs or Organizations: Not on file  . Attends Archivist Meetings: Not on file  . Marital Status: Not on file   No Known Allergies Family History  Problem Relation Age of Onset  . Colon cancer Neg Hx   . Stomach cancer Neg Hx   . Esophageal cancer Neg Hx   . Pancreatic cancer Neg Hx    . Rectal cancer Neg Hx      Current Outpatient Medications (Cardiovascular):  .  atorvastatin (LIPITOR) 80 MG tablet, Take 80 mg by mouth daily. .  nitroGLYCERIN (NITROSTAT) 0.4 MG SL tablet, Place 1 tablet (0.4 mg total) under the tongue every 5 (five) minutes as needed for chest pain.   Current Outpatient Medications (Analgesics):  .  aspirin EC 81 MG tablet, Take 81 mg by mouth daily.   Current Outpatient Medications (Other):  Marland Kitchen  Coenzyme Q10-Fish Oil-Vit E (CO-Q 10 OMEGA-3 FISH OIL PO), Take 1 tablet by mouth daily. .  fluconazole (DIFLUCAN) 150 MG tablet, Take 1 tablet (150 mg total) by mouth once a week. As needed for recurrent rash .  pantoprazole (PROTONIX) 20 MG tablet, TAKE 1 TABLET BY MOUTH ONCE DAILY   Reviewed prior external information including notes and imaging from  primary care provider As well as notes that were available from care everywhere and other healthcare systems.  Past medical history, social, surgical and family history all reviewed in electronic medical record.  No pertanent information unless stated regarding to the chief complaint.   Review of Systems:  No headache, visual changes, nausea, vomiting, diarrhea, constipation, dizziness, abdominal pain, skin rash, fevers, chills, night sweats, weight loss, swollen lymph nodes, body aches, joint swelling, chest pain, shortness of breath, mood changes. POSITIVE muscle aches  Objective  Blood pressure 130/80, pulse 64, height 5\' 9"  (1.753 m), weight 225 lb (102.1 kg), SpO2 95 %.   General: No apparent distress alert and oriented x3 mood and affect normal, dressed appropriately.  HEENT: Pupils equal, extraocular movements intact  Respiratory: Patient's speak in full sentences and does not appear short of breath  Cardiovascular: No lower extremity edema, non tender, no erythema  Skin: Warm dry intact with no signs of infection or rash on extremities or on axial skeleton.  Abdomen: Soft nontender  Neuro:  Cranial nerves II through XII are intact, neurovascularly intact in all extremities with 2+ DTRs and 2+ pulses.  Lymph: No lymphadenopathy of posterior or anterior cervical chain or axillae bilaterally.  Gait normal with good balance and coordination.  MSK:  Non tender with full range of motion and good stability and symmetric strength and tone of shoulders, elbows, wrist, hip, and ankles bilaterally.  Right knee exam still has abnormal thigh to calf ratio.  Swelling noted.  Lacks last 10 degrees of flexion in the last 5 degrees of extension.  Fullness noted of the popliteal area.  Pain over the medial joint space.  Instability with valgus and varus force.  Procedure: Real-time Ultrasound Guided Injection of right knee Device: GE Logiq Q7 Ultrasound guided injection is preferred based studies that show increased  duration, increased effect, greater accuracy, decreased procedural pain, increased response rate, and decreased cost with ultrasound guided versus blind injection.  Verbal informed consent obtained.  Time-out conducted.  Noted no overlying erythema, induration, or other signs of local infection.  Skin prepped in a sterile fashion.  Local anesthesia: Topical Ethyl chloride.  With sterile technique and under real time ultrasound guidance: With a 22-gauge 2 inch needle patient was injected with 4 cc of 0.5% Marcaine and aspirated 2 cc of gel-like fluid then injected with 1 cc of Kenalog 40 mg/dL. This was from a posterior approach.  Completed without difficulty  Pain immediately resolved suggesting accurate placement of the medication.  Advised to call if fevers/chills, erythema, induration, drainage, or persistent bleeding.  Images permanently stored and available for review in the ultrasound unit.  Impression: Technically successful ultrasound guided injection.   Impression and Recommendations:     This case required medical decision making of moderate complexity. The above  documentation has been reviewed and is accurate and complete Lyndal Pulley, DO       Note: This dictation was prepared with Dragon dictation along with smaller phrase technology. Any transcriptional errors that result from this process are unintentional.

## 2020-01-25 NOTE — Assessment & Plan Note (Signed)
Attempted aspiration of the right knee was secondary to her chronic problem with exacerbation.  Seems to be more of a ganglion cyst versus possible gout deposits.  Was very difficult to assess at the moment.  Patient does have known arthritic changes as well.  We discussed the possibility of laboratory work-up and will consider this at follow-up.  In addition to this patient is a candidate for secondary to the arthritis of the knee for viscosupplementation and we will try to do this as well.  Follow-up again 4 to 8 weeks

## 2020-01-25 NOTE — Assessment & Plan Note (Signed)
Aspirated Baker's cyst today.  Attempting to get viscosupplementation approved.  Discussed icing regimen and home exercise, discussed avoiding certain activities.  Follow-up again in 4 to 8 weeks.

## 2020-01-25 NOTE — Patient Instructions (Addendum)
Good to see you! See you again in 6 weeks 

## 2020-01-29 ENCOUNTER — Ambulatory Visit: Payer: Medicare Other | Attending: Internal Medicine

## 2020-01-29 ENCOUNTER — Ambulatory Visit: Payer: Medicare Other

## 2020-01-29 DIAGNOSIS — Z23 Encounter for immunization: Secondary | ICD-10-CM | POA: Insufficient documentation

## 2020-01-29 NOTE — Progress Notes (Signed)
   Covid-19 Vaccination Clinic  Name:  James [name]    MRN: [id number] DOB: [birth year]  01/29/2020  James [name] was observed post Covid-19 immunization for 15 minutes without incident. He was provided with Vaccine Information Sheet and instruction to access the V-Safe system.   James [name] was instructed to call 911 with any severe reactions post vaccine: Marland Kitchen Difficulty breathing  . Swelling of face and throat  . A fast heartbeat  . A bad rash all over body  . Dizziness and weakness   Immunizations Administered    Name Date Dose VIS Date Route   Pfizer COVID-19 Vaccine 01/29/2020  4:48 PM 0.3 mL 11/04/2019 Intramuscular   Manufacturer: [place]   Lot: [id number]   [place]: [id number]

## 2020-02-04 ENCOUNTER — Encounter: Payer: Self-pay | Admitting: Family Medicine

## 2020-02-22 ENCOUNTER — Ambulatory Visit: Payer: Medicare Other | Attending: Internal Medicine

## 2020-02-22 DIAGNOSIS — Z23 Encounter for immunization: Secondary | ICD-10-CM

## 2020-02-22 NOTE — Progress Notes (Signed)
   Covid-19 Vaccination Clinic  Name:  James [name]    MRN: [id number] DOB: 1956-10-11  02/22/2020  [name] was observed post Covid-19 immunization for 15 minutes without incident. He was provided with Vaccine Information Sheet and instruction to access the V-Safe system.   [name] was instructed to call 911 with any severe reactions post vaccine: Marland Kitchen Difficulty breathing  . Swelling of face and throat  . A fast heartbeat  . A bad rash all over body  . Dizziness and weakness   Immunizations Administered    Name Date Dose VIS Date Route   Pfizer COVID-19 Vaccine 02/22/2020  2:01 PM 0.3 mL 11/04/2019 Intramuscular   Manufacturer: [place]   Lot: [id number]   [place]: [id number]

## 2020-03-07 ENCOUNTER — Ambulatory Visit: Payer: Medicare Other | Admitting: Family Medicine

## 2020-03-07 ENCOUNTER — Other Ambulatory Visit: Payer: Self-pay

## 2020-03-07 ENCOUNTER — Encounter: Payer: Self-pay | Admitting: Family Medicine

## 2020-03-07 DIAGNOSIS — M170 Bilateral primary osteoarthritis of knee: Secondary | ICD-10-CM | POA: Diagnosis not present

## 2020-03-07 NOTE — Progress Notes (Signed)
[place] Phone: [phone] Subjective:   Fontaine No, am serving as a scribe for Dr. Hulan Saas. This visit occurred during the SARS-CoV-2 public health emergency.  Safety protocols were in place, including screening questions prior to the visit, additional usage of staff PPE, and extensive cleaning of exam room while observing appropriate contact time as indicated for disinfecting solutions.   I'm seeing this patient by the request  of:  Venia Carbon, MD  CC: Bilateral knee pain right greater than left  [id number]   01/25/2020 Attempted aspiration of the right knee was secondary to her chronic problem with exacerbation.  Seems to be more of a ganglion cyst versus possible gout deposits.  Was very difficult to assess at the moment.  Patient does have known arthritic changes as well.  We discussed the possibility of laboratory work-up and will consider this at follow-up.  In addition to this patient is a candidate for secondary to the arthritis of the knee for viscosupplementation and we will try to do this as well.  Follow-up again 4 to 8 weeks  Aspirated Baker's cyst today.  Attempting to get viscosupplementation approved.  Discussed icing regimen and home exercise, discussed avoiding certain activities.  Follow-up again in 4 to 8 weeks.  Update 03/07/2020 Derrin Magwood is a 64 y.o. male coming in with complaint of right knee pain. No change in pain since last visit. Constant pain. Continues to have left knee pain as well.  Patient states continues to have discomfort over the course of time.  Patient rates the severity of pain is 8 out of 10.  No increase in instability just more of the pain still at the moment.    Past Medical History:  Diagnosis Date  . Adenomatous colon polyp 2010  . CAD (coronary artery disease)   . Duodenal ulcer due to Helicobacter pylori 0000000  . Gastric ulcer due to  Helicobacter pylori 123XX123  . Hx of adenomatous colonic polyps 01/26/2009  . Hyperlipidemia   . MI (myocardial infarction) ([place])   . Tinea versicolor    Past Surgical History:  Procedure Laterality Date  . COLONOSCOPY W/ POLYPECTOMY    . CORONARY STENT PLACEMENT     at [facility]  . jaw tumor Left   . TONSILLECTOMY  1962   Social History   Socioeconomic History  . Marital status: Married    Spouse name: Not on file  . Number of children: 2  . Years of education: Not on file  . Highest education level: Not on file  Occupational History  . 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 Smoking status: Former Smoker    Quit date: 11/24/1978    Years since quitting: 41.3  . Smokeless tobacco: Never Used  Substance and Sexual Activity  . Alcohol use: Yes    Comment: 1 to 2 beers a month  . Drug use: No  . Sexual activity: Not on file  Other Topics Concern  . Not on file  Social History Narrative   Former minor league shortstop      Has living will   Wife is health care POA--children would be alternates   Would accept resuscitation   No tube feeds if cognitively unaware   Social Determinants of Health   Financial Resource Strain:   . Difficulty of Paying Living Expenses:   Food Insecurity:   .  Worried About Charity fundraiser in the Last Year:   . Arboriculturist in the Last Year:   Transportation Needs:   . Film/video editor (Medical):   Marland Kitchen Lack of Transportation (Non-Medical):   Physical Activity:   . Days of Exercise per Week:   . Minutes of Exercise per Session:   Stress:   . Feeling of Stress :   Social Connections:   . Frequency of Communication with Friends and Family:   . Frequency of Social Gatherings with Friends and Family:   . Attends Religious Services:   . Active Member of Clubs or Organizations:   . Attends Archivist Meetings:   Marland Kitchen Marital Status:    No Known  Allergies Family History  Problem Relation Age of Onset  . Colon cancer Neg Hx   . Stomach cancer Neg Hx   . Esophageal cancer Neg Hx   . Pancreatic cancer Neg Hx   . Rectal cancer Neg Hx      Current Outpatient Medications (Cardiovascular):  .  atorvastatin (LIPITOR) 80 MG tablet, Take 80 mg by mouth daily. .  nitroGLYCERIN (NITROSTAT) 0.4 MG SL tablet, Place 1 tablet (0.4 mg total) under the tongue every 5 (five) minutes as needed for chest pain.   Current Outpatient Medications (Analgesics):  .  aspirin EC 81 MG tablet, Take 81 mg by mouth daily.   Current Outpatient Medications (Other):  Marland Kitchen  Coenzyme Q10-Fish Oil-Vit E (CO-Q 10 OMEGA-3 FISH OIL PO), Take 1 tablet by mouth daily. .  fluconazole (DIFLUCAN) 150 MG tablet, Take 1 tablet (150 mg total) by mouth once a week. As needed for recurrent rash .  pantoprazole (PROTONIX) 20 MG tablet, TAKE 1 TABLET BY MOUTH ONCE DAILY   Reviewed prior external information including notes and imaging from  primary care provider As well as notes that were available from care everywhere and other healthcare systems.  Past medical history, social, surgical and family history all reviewed in electronic medical record.  No pertanent information unless stated regarding to the chief complaint.   Review of Systems:  No headache, visual changes, nausea, vomiting, diarrhea, constipation, dizziness, abdominal pain, skin rash, fevers, chills, night sweats, weight loss, swollen lymph nodes, body aches,, chest pain, shortness of breath, mood changes. POSITIVE muscle aches, joint swelling  Objective  Blood pressure 118/66, pulse 68, height 5\' 9"  (1.753 m), weight 220 lb (99.8 kg), SpO2 97 %.   General: No apparent distress alert and oriented x3 mood and affect normal, dressed appropriately.  HEENT: Pupils equal, extraocular movements intact  Respiratory: Patient's speak in full sentences and does not appear short of breath  Cardiovascular: No lower  extremity edema, non tender, no erythema  Neuro: Cranial nerves II through XII are intact, neurovascularly intact in all extremities with 2+ DTRs and 2+ pulses.  Gait mild antalgic MSK:   Right knee exam shows the patient still has some varus deformity of the knee.  Patient does have swelling compared to the contralateral side with an effusion noted.  Does seem to be more of a synovitis with a tightness.  Lacks last 5 degrees of flexion.  After informed written and verbal consent, patient was seated on exam table. Right knee was prepped with alcohol swab and utilizing anterolateral approach, patient's right knee space was injected with 60 mg per 3 mL of Durolane with a 21-gauge 2 inch needle patient tolerated the procedure well without immediate complications.    Impression and Recommendations:  This case required medical decision making of moderate complexity. The above documentation has been reviewed and is accurate and complete Lyndal Pulley, DO       Note: This dictation was prepared with Dragon dictation along with smaller phrase technology. Any transcriptional errors that result from this process are unintentional.

## 2020-03-07 NOTE — Assessment & Plan Note (Addendum)
Chronic problem with worsening symptoms.  Right-sided viscosupplementation given today.  Tolerated the procedure well.  Discussed icing regimen.  Patient declined formal physical therapy but secondary to worsening symptoms may need to consider the possibility of surgical intervention in the near future.  Patient is in agreement with the plan.  Follow-up with me again 6 to 8 weeks.

## 2020-03-07 NOTE — Patient Instructions (Signed)
See me in 6 weeks

## 2020-03-15 DIAGNOSIS — I2510 Atherosclerotic heart disease of native coronary artery without angina pectoris: Secondary | ICD-10-CM | POA: Diagnosis not present

## 2020-05-16 DIAGNOSIS — I2510 Atherosclerotic heart disease of native coronary artery without angina pectoris: Secondary | ICD-10-CM | POA: Diagnosis not present

## 2020-06-26 ENCOUNTER — Ambulatory Visit: Payer: Medicare Other | Admitting: Family Medicine

## 2020-06-26 ENCOUNTER — Encounter: Payer: Self-pay | Admitting: Family Medicine

## 2020-06-26 ENCOUNTER — Other Ambulatory Visit: Payer: Self-pay

## 2020-06-26 ENCOUNTER — Encounter: Payer: Self-pay | Admitting: Internal Medicine

## 2020-06-26 ENCOUNTER — Ambulatory Visit: Payer: Self-pay

## 2020-06-26 ENCOUNTER — Ambulatory Visit (INDEPENDENT_AMBULATORY_CARE_PROVIDER_SITE_OTHER): Payer: Medicare Other | Admitting: Internal Medicine

## 2020-06-26 VITALS — BP 120/76 | HR 64 | Temp 97.9°F | Ht 69.5 in | Wt 219.0 lb

## 2020-06-26 VITALS — BP 140/90 | HR 64 | Ht 69.5 in | Wt 220.0 lb

## 2020-06-26 DIAGNOSIS — M159 Polyosteoarthritis, unspecified: Secondary | ICD-10-CM

## 2020-06-26 DIAGNOSIS — M170 Bilateral primary osteoarthritis of knee: Secondary | ICD-10-CM | POA: Diagnosis not present

## 2020-06-26 DIAGNOSIS — I2510 Atherosclerotic heart disease of native coronary artery without angina pectoris: Secondary | ICD-10-CM

## 2020-06-26 DIAGNOSIS — M25561 Pain in right knee: Secondary | ICD-10-CM | POA: Diagnosis not present

## 2020-06-26 DIAGNOSIS — Z7189 Other specified counseling: Secondary | ICD-10-CM | POA: Diagnosis not present

## 2020-06-26 DIAGNOSIS — G8929 Other chronic pain: Secondary | ICD-10-CM | POA: Diagnosis not present

## 2020-06-26 DIAGNOSIS — Z0000 Encounter for general adult medical examination without abnormal findings: Secondary | ICD-10-CM

## 2020-06-26 DIAGNOSIS — R200 Anesthesia of skin: Secondary | ICD-10-CM | POA: Insufficient documentation

## 2020-06-26 DIAGNOSIS — B360 Pityriasis versicolor: Secondary | ICD-10-CM | POA: Diagnosis not present

## 2020-06-26 DIAGNOSIS — Z125 Encounter for screening for malignant neoplasm of prostate: Secondary | ICD-10-CM | POA: Diagnosis not present

## 2020-06-26 LAB — COMPREHENSIVE METABOLIC PANEL
ALT: 19 U/L (ref 0–53)
AST: 20 U/L (ref 0–37)
Albumin: 4.3 g/dL (ref 3.5–5.2)
Alkaline Phosphatase: 79 U/L (ref 39–117)
BUN: 12 mg/dL (ref 6–23)
CO2: 30 mEq/L (ref 19–32)
Calcium: 9.9 mg/dL (ref 8.4–10.5)
Chloride: 106 mEq/L (ref 96–112)
Creatinine, Ser: 1.09 mg/dL (ref 0.40–1.50)
GFR: 81.87 mL/min (ref >60.00)
Glucose, Bld: 93 mg/dL (ref 70–99)
Potassium: 4.1 mEq/L (ref 3.5–5.1)
Sodium: 140 mEq/L (ref 135–145)
Total Bilirubin: 0.4 mg/dL (ref 0.2–1.2)
Total Protein: 6.8 g/dL (ref 6.0–8.3)

## 2020-06-26 LAB — LIPID PANEL
Cholesterol: 147 mg/dL (ref 0–200)
HDL: 35.8 mg/dL — ABNORMAL LOW (ref >39.00)
LDL Cholesterol: 75 mg/dL (ref 0–99)
NonHDL: 111.61
Total CHOL/HDL Ratio: 4
Triglycerides: 184 mg/dL — ABNORMAL HIGH (ref 0.0–149.0)
VLDL: 36.8 mg/dL (ref 0.0–40.0)

## 2020-06-26 LAB — CBC
HCT: 45.1 % (ref 39.0–52.0)
Hemoglobin: 14.5 g/dL (ref 13.0–17.0)
MCHC: 32 g/dL (ref 30.0–36.0)
MCV: 91.2 fl (ref 78.0–100.0)
Platelets: 217 10*3/uL (ref 150.0–400.0)
RBC: 4.95 Mil/uL (ref 4.22–5.81)
RDW: 13.1 % (ref 11.5–15.5)
WBC: 4.8 10*3/uL (ref 4.0–10.5)

## 2020-06-26 LAB — VITAMIN B12: Vitamin B-12: 893 pg/mL (ref 211–911)

## 2020-06-26 LAB — T4, FREE: Free T4: 0.79 ng/dL (ref 0.60–1.60)

## 2020-06-26 LAB — PSA, MEDICARE: PSA: 1.08 ng/ml (ref 0.10–4.00)

## 2020-06-26 MED ORDER — ATORVASTATIN CALCIUM 80 MG PO TABS: 80.0000 mg | ORAL_TABLET | Freq: Every day | ORAL | 3 refills | Status: DC

## 2020-06-26 NOTE — Assessment & Plan Note (Signed)
Mostly in hands/knees Discussed tylenol/topical diclofenac prn Seeing Dr Tamala Julian for knee

## 2020-06-26 NOTE — Assessment & Plan Note (Signed)
Severe knee arthritis bilaterally.  Right greater than left.  Patient did have a Baker's cyst that was aspirated today and did have significant improvement.  Left side seems to be worsening.  We discussed which activities to do which wants to avoid.  Patient would need to consider the possibility of surgical intervention with viscosupplementation failing.  Patient will send a message in 1 month and follow-up with me again in 2 months

## 2020-06-26 NOTE — Progress Notes (Signed)
Hearing Screening   Method: Audiometry   125Hz  250Hz  500Hz  1000Hz  2000Hz  3000Hz  4000Hz  6000Hz  8000Hz   Right ear:   20 20 20  20     Left ear:   20 25 20   40    Vision Screening Comments: June 2021

## 2020-06-26 NOTE — Assessment & Plan Note (Signed)
Uses the fluconazole intermittently

## 2020-06-26 NOTE — Assessment & Plan Note (Signed)
I have personally reviewed the Medicare Annual Wellness questionnaire and have noted 1. The patient's medical and social history 2. Their use of alcohol, tobacco or illicit drugs 3. Their current medications and supplements 4. The patient's functional ability including ADL's, fall risks, home safety risks and hearing or visual             impairment. 5. Diet and physical activities 6. Evidence for depression or mood disorders  The patients weight, height, BMI and visual acuity have been recorded in the chart I have made referrals, counseling and provided education to the patient based review of the above and I have provided the pt with a written personalized care plan for preventive services.  I have provided you with a copy of your personalized plan for preventive services. Please take the time to review along with your updated medication list.  Discussed exercise Got COVID vaccine Still prefers no flu or pneumonia vaccines Colon due in December Discussed PSA ---will check

## 2020-06-26 NOTE — Progress Notes (Signed)
Subjective:    Patient ID: James [name], male    DOB: 19-Nov-1956, 64 y.o.   MRN: [id number]  HPI Here for Medicare wellness visit and follow up of chronic health conditions This visit occurred during the SARS-CoV-2 public health emergency.  Safety protocols were in place, including screening questions prior to the visit, additional usage of staff PPE, and extensive cleaning of exam room while observing appropriate contact time as indicated for disinfecting solutions.   Reviewed form and advanced directives Reviewed other doctors Vision is okay--early cataract on left Mild hearing problems No falls No depression or anhedonia No tobacco Occasional alcohol Had been exercising but has slacked off---some walking Independent with instrumental ADLs No memory problems  Working in yard about a month ago Started with itching spots Took a bleach bath and it did improve No persistent issues Discussed ---likely chiggers  Keeps up with cardiologist No chest pain No SOB No dizziness or syncope No edema No palpitations  Some mild arthritic problems with hands No meds Some trouble with left knee---going to see sports medicine today  Still gets occasional TV Has used the fluconazole more frequently this year  No longer taking the pantoprazole No abdominal pain or heartburn No dysphagia  Current Outpatient Medications on File Prior to Visit  Medication Sig Dispense Refill  . aspirin EC 81 MG tablet Take 81 mg by mouth daily.    Marland Kitchen atorvastatin (LIPITOR) 80 MG tablet Take 80 mg by mouth daily.    . Coenzyme Q10-Fish Oil-Vit E (CO-Q 10 OMEGA-3 FISH OIL PO) Take 1 tablet by mouth daily.    . fluconazole (DIFLUCAN) 150 MG tablet Take 1 tablet (150 mg total) by mouth once a week. As needed for recurrent rash 4 tablet 5  . nitroGLYCERIN (NITROSTAT) 0.4 MG SL tablet Place 1 tablet (0.4 mg total) under the tongue every 5 (five) minutes as needed for chest pain. 25 tablet 1   No current  facility-administered medications on file prior to visit.    No Known Allergies  Past Medical History:  Diagnosis Date  . Adenomatous colon polyp 2010  . CAD (coronary artery disease)   . Duodenal ulcer due to Helicobacter pylori 63/11/6008  . Gastric ulcer due to Helicobacter pylori 07/27/2354  . Hx of adenomatous colonic polyps 01/26/2009  . Hyperlipidemia   . MI (myocardial infarction) ([place])   . Tinea versicolor     Past Surgical History:  Procedure Laterality Date  . COLONOSCOPY W/ POLYPECTOMY    . CORONARY STENT PLACEMENT     at [facility]  . jaw tumor Left   . TONSILLECTOMY  1962    Family History  Problem Relation Age of Onset  . Colon cancer Neg Hx   . Stomach cancer Neg Hx   . Esophageal cancer Neg Hx   . Pancreatic cancer Neg Hx   . Rectal cancer Neg Hx     Social History   Socioeconomic History  . Marital status: Married    Spouse name: Not on file  . Number of children: 2  . Years of education: Not on file  . Highest education level: Not on file  Occupational History  . 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 Smoking status: Former Smoker    Quit date: 11/24/1978    Years since quitting: 41.6  . Smokeless tobacco: Never Used  Substance and Sexual Activity  . Alcohol use: Yes  Comment: 1 to 2 beers a month  . Drug use: No  . Sexual activity: Not on file  Other Topics Concern  . Not on file  Social History Narrative   Former minor league shortstop      Has living will   Wife is health care POA--children would be alternates   Would accept resuscitation   No tube feeds if cognitively unaware   Social Determinants of Health   Financial Resource Strain:   . Difficulty of Paying Living Expenses:   Food Insecurity:   . Worried About Charity fundraiser in the Last Year:   . Arboriculturist in the Last Year:   Transportation Needs:   . Film/video editor (Medical):   Marland Kitchen  Lack of Transportation (Non-Medical):   Physical Activity:   . Days of Exercise per Week:   . Minutes of Exercise per Session:   Stress:   . Feeling of Stress :   Social Connections:   . Frequency of Communication with Friends and Family:   . Frequency of Social Gatherings with Friends and Family:   . Attends Religious Services:   . Active Member of Clubs or Organizations:   . Attends Archivist Meetings:   Marland Kitchen Marital Status:   Intimate Partner Violence:   . Fear of Current or Ex-Partner:   . Emotionally Abused:   Marland Kitchen Physically Abused:   . Sexually Abused:    Review of Systems Appetite is fine Weight is about the same Wears seat belt Sleeps well Bowels are fine---no blood Urinary stream is fine. Nocturia x 2 No skin problems--no derm Teeth are fine--keeps up with dentist Mild numbness in feet at night--more comfortable wearing socks    Objective:   Physical Exam Constitutional:      Appearance: Normal appearance.  HENT:     Head: Normocephalic and atraumatic.     Mouth/Throat:     Comments: No lesions Eyes:     Conjunctiva/sclera: Conjunctivae normal.     Pupils: Pupils are equal, round, and reactive to light.  Cardiovascular:     Rate and Rhythm: Normal rate and regular rhythm.     Pulses: Normal pulses.     Heart sounds: No murmur heard.  No gallop.   Abdominal:     Palpations: Abdomen is soft.     Tenderness: There is no abdominal tenderness.  Musculoskeletal:     Cervical back: Neck supple.     Right lower leg: No edema.     Left lower leg: No edema.  Lymphadenopathy:     Cervical: No cervical adenopathy.  Skin:    Findings: No rash.  Neurological:     Mental Status: He is alert and oriented to person, place, and time.     Comments: President--"Joe Biden, Obama----Donald Trump" [id number] Recall 3/3  Psychiatric:        Mood and Affect: Mood normal.        Behavior: Behavior normal.            Assessment & Plan:

## 2020-06-26 NOTE — Assessment & Plan Note (Signed)
Sounds like non specific neuropathy Will check labs Consider neurology evaluation if worsens

## 2020-06-26 NOTE — Patient Instructions (Signed)
If your hands get worse, you can try oral tylenol or topical diclofenac gel (these are both over the counter).

## 2020-06-26 NOTE — Progress Notes (Signed)
[facility] [address] [place] Phone: [phone] Subjective:   I James [name] am serving as a Education administrator for Dr. Hulan Saas.  This visit occurred during the SARS-CoV-2 public health emergency.  Safety protocols were in place, including screening questions prior to the visit, additional usage of staff PPE, and extensive cleaning of exam room while observing appropriate contact time as indicated for disinfecting solutions.   I'm seeing this patient by the request  of:  Venia Carbon, MD  CC: Bilateral knee pain  HYI:[id number]   03/07/2020 Chronic problem with worsening symptoms.  Right-sided viscosupplementation given today.  Tolerated the procedure well.  Discussed icing regimen.  Patient declined formal physical therapy but secondary to worsening symptoms may need to consider the possibility of surgical intervention in the near future.  Patient is in agreement with the plan.  Follow-up with me again 6 to 8 weeks.  Update 06/26/2020 James [name] is a 64 y.o. male coming in with complaint of bilateral knee pain. Patient states the knees are bad. Would like injections and imaging. Left is worse. Pain in the posterior right knee and patient states the knee needs to be drained.        Past Medical History:  Diagnosis Date  . Adenomatous colon polyp 2010  . CAD (coronary artery disease)   . Duodenal ulcer due to Helicobacter pylori 86/76/7209  . Gastric ulcer due to Helicobacter pylori 02/28/961  . Hx of adenomatous colonic polyps 01/26/2009  . Hyperlipidemia   . MI (myocardial infarction) ([place])   . Tinea versicolor    Past Surgical History:  Procedure Laterality Date  . COLONOSCOPY W/ POLYPECTOMY    . CORONARY STENT PLACEMENT     at [facility]  . jaw tumor Left   . TONSILLECTOMY  1962   Social History   Socioeconomic History  . Marital status: Married    Spouse name: Not on file  . Number of children: 2  . Years of  education: Not on file  . Highest education level: Not on file  Occupational History  . 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 Smoking status: Former Smoker    Quit date: 11/24/1978    Years since quitting: 41.6  . Smokeless tobacco: Never Used  Substance and Sexual Activity  . Alcohol use: Yes    Comment: 1 to 2 beers a month  . Drug use: No  . Sexual activity: Not on file  Other Topics Concern  . Not on file  Social History Narrative   Former minor league shortstop      Has living will   Wife is health care POA--children would be alternates   Would accept resuscitation   No tube feeds if cognitively unaware   Social Determinants of Health   Financial Resource Strain:   . Difficulty of Paying Living Expenses:   Food Insecurity:   . Worried About Charity fundraiser in the Last Year:   . Arboriculturist in the Last Year:   Transportation Needs:   . Film/video editor (Medical):   Marland Kitchen Lack of Transportation (Non-Medical):   Physical Activity:   . Days of Exercise per Week:   . Minutes of Exercise per Session:   Stress:   . Feeling of Stress :   Social Connections:   . Frequency of Communication with Friends and Family:   .  Frequency of Social Gatherings with Friends and Family:   . Attends Religious Services:   . Active Member of Clubs or Organizations:   . Attends Archivist Meetings:   Marland Kitchen Marital Status:    No Known Allergies Family History  Problem Relation Age of Onset  . Colon cancer Neg Hx   . Stomach cancer Neg Hx   . Esophageal cancer Neg Hx   . Pancreatic cancer Neg Hx   . Rectal cancer Neg Hx      Current Outpatient Medications (Cardiovascular):  .  atorvastatin (LIPITOR) 80 MG tablet, Take 80 mg by mouth daily. .  nitroGLYCERIN (NITROSTAT) 0.4 MG SL tablet, Place 1 tablet (0.4 mg total) under the tongue every 5 (five) minutes as needed for chest pain.   Current  Outpatient Medications (Analgesics):  .  aspirin EC 81 MG tablet, Take 81 mg by mouth daily.   Current Outpatient Medications (Other):  Marland Kitchen  Coenzyme Q10-Fish Oil-Vit E (CO-Q 10 OMEGA-3 FISH OIL PO), Take 1 tablet by mouth daily. .  fluconazole (DIFLUCAN) 150 MG tablet, Take 1 tablet (150 mg total) by mouth once a week. As needed for recurrent rash .  pantoprazole (PROTONIX) 20 MG tablet, TAKE 1 TABLET BY MOUTH ONCE DAILY   Reviewed prior external information including notes and imaging from  primary care provider As well as notes that were available from care everywhere and other healthcare systems.  Past medical history, social, surgical and family history all reviewed in electronic medical record.  No pertanent information unless stated regarding to the chief complaint.   Review of Systems:  No headache, visual changes, nausea, vomiting, diarrhea, constipation, dizziness, abdominal pain, skin rash, fevers, chills, night sweats, weight loss, swollen lymph nodes, body aches, joint swelling, chest pain, shortness of breath, mood changes. POSITIVE muscle aches  Objective  There were no vitals taken for this visit.   General: No apparent distress alert and oriented x3 mood and affect normal, dressed appropriately.  HEENT: Pupils equal, extraocular movements intact  Respiratory: Patient's speak in full sentences and does not appear short of breath  Cardiovascular: No lower extremity edema, non tender, no erythema  Neuro: Cranial nerves II through XII are intact, neurovascularly intact in all extremities with 2+ DTRs and 2+ pulses.  Gait antalgic Right knee does have some swelling noted.  Lacks last 10 degrees of flexion extension.  Varus deformity noted.  Instability with valgus and varus force.  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  Abnormal thigh to calf ratio noted.  Lacks last 10 degrees of extension  After informed written and  verbal consent, patient was seated on exam table. Left knee was prepped with alcohol swab and utilizing anterolateral approach, patient's left knee space was injected with 4:1  marcaine 0.5%: Kenalog 40mg /dL. Patient tolerated the procedure well without immediate complications.  Procedure: Real-time Ultrasound Guided Injection of right knee Device: GE Logiq Q7 Ultrasound guided injection is preferred based studies that show increased duration, increased effect, greater accuracy, decreased procedural pain, increased response rate, and decreased cost with ultrasound guided versus blind injection.  Verbal informed consent obtained.  Time-out conducted.  Noted no overlying erythema, induration, or other signs of local infection.  Skin prepped in a sterile fashion.  Local anesthesia: Topical Ethyl chloride.  With sterile technique and under real time ultrasound guidance: With a 22-gauge 2 inch needle patient was injected with 4 cc of 0.5% Marcaine and aspirated 25  cc of straw-colored fluid then injected 1 cc of Kenalog 40 mg/dL. This was from a posterior approach.  Completed without difficulty  Pain immediately resolved suggesting accurate placement of the medication.  Advised to call if fevers/chills, erythema, induration, drainage, or persistent bleeding.  Images permanently stored and available for review in the ultrasound unit.  Impression: Technically successful ultrasound guided injection.    Impression and Recommendations:     The above documentation has been reviewed and is accurate and complete Lyndal Pulley, DO       Note: This dictation was prepared with Dragon dictation along with smaller phrase technology. Any transcriptional errors that result from this process are unintentional.

## 2020-06-26 NOTE — Patient Instructions (Addendum)
Good to see you Send me a message in 4 weeks See me again in 8 weeks

## 2020-06-26 NOTE — Assessment & Plan Note (Signed)
No symptoms since angioplasty Has stent Statin and ASA No beta blocker due to past bradycardia

## 2020-06-26 NOTE — Assessment & Plan Note (Signed)
See social history 

## 2020-08-08 ENCOUNTER — Encounter: Payer: Self-pay | Admitting: Family Medicine

## 2020-08-31 MED ORDER — FLUCONAZOLE 150 MG PO TABS: 150.0000 mg | ORAL_TABLET | ORAL | 5 refills | Status: DC

## 2020-09-03 DIAGNOSIS — I2510 Atherosclerotic heart disease of native coronary artery without angina pectoris: Secondary | ICD-10-CM | POA: Diagnosis not present

## 2020-10-10 ENCOUNTER — Ambulatory Visit (AMBULATORY_SURGERY_CENTER): Payer: Self-pay | Admitting: *Deleted

## 2020-10-10 ENCOUNTER — Other Ambulatory Visit: Payer: Self-pay

## 2020-10-10 ENCOUNTER — Ambulatory Visit: Payer: Medicare Other | Admitting: Internal Medicine

## 2020-10-10 VITALS — Ht 69.5 in | Wt 215.0 lb

## 2020-10-10 DIAGNOSIS — Z86010 Personal history of colonic polyps: Secondary | ICD-10-CM

## 2020-10-10 NOTE — Progress Notes (Signed)

## 2020-10-11 ENCOUNTER — Encounter: Payer: Self-pay | Admitting: Internal Medicine

## 2020-10-31 ENCOUNTER — Encounter: Payer: Medicare Other | Admitting: Gastroenterology

## 2020-10-31 ENCOUNTER — Other Ambulatory Visit: Payer: Self-pay

## 2020-10-31 ENCOUNTER — Encounter: Payer: Self-pay | Admitting: Internal Medicine

## 2020-10-31 ENCOUNTER — Ambulatory Visit (AMBULATORY_SURGERY_CENTER): Payer: Medicare Other | Admitting: Internal Medicine

## 2020-10-31 VITALS — BP 122/77 | HR 64 | Temp 97.8°F | Resp 17 | Ht 69.0 in | Wt 215.0 lb

## 2020-10-31 DIAGNOSIS — Z1211 Encounter for screening for malignant neoplasm of colon: Secondary | ICD-10-CM | POA: Diagnosis not present

## 2020-10-31 DIAGNOSIS — D124 Benign neoplasm of descending colon: Secondary | ICD-10-CM | POA: Diagnosis not present

## 2020-10-31 DIAGNOSIS — D123 Benign neoplasm of transverse colon: Secondary | ICD-10-CM

## 2020-10-31 DIAGNOSIS — Z86010 Personal history of colonic polyps: Secondary | ICD-10-CM | POA: Diagnosis not present

## 2020-10-31 MED ORDER — SODIUM CHLORIDE 0.9 % IV SOLN: 500.0000 mL | Freq: Once | INTRAVENOUS | Status: DC

## 2020-10-31 NOTE — Progress Notes (Signed)
Lidocaine buffer  robinol antisialogogue. A and O x3. Report to RN. Tolerated MAC anesthesia well.

## 2020-10-31 NOTE — Progress Notes (Signed)
Called to room to assist during endoscopic procedure.  Patient ID and intended procedure confirmed with present staff. Received instructions for my participation in the procedure from the performing physician.  

## 2020-10-31 NOTE — Patient Instructions (Addendum)
I removed 3 tiny polyps today.  I will let you know pathology results and when to have another routine colonoscopy by mail and/or My Chart.  I appreciate the opportunity to care for you. Gatha Mayer, MD, [facility]   Resume previous medications.  Await pathology for final recommendations.  Handouts on findings given to patient.   YOU HAD AN ENDOSCOPIC PROCEDURE TODAY AT [facility]:   Refer to the procedure report that was given to you for any specific questions about what was found during the examination.  If the procedure report does not answer your questions, please call your gastroenterologist to clarify.  If you requested that your care partner not be given the details of your procedure findings, then the procedure report has been included in a sealed envelope for you to review at your convenience later.  YOU SHOULD EXPECT: Some feelings of bloating in the abdomen. Passage of more gas than usual.  Walking can help get rid of the air that was put into your GI tract during the procedure and reduce the bloating. If you had a lower endoscopy (such as a colonoscopy or flexible sigmoidoscopy) you may notice spotting of blood in your stool or on the toilet paper. If you underwent a bowel prep for your procedure, you may not have a normal bowel movement for a few days.  Please Note:  You might notice some irritation and congestion in your nose or some drainage.  This is from the oxygen used during your procedure.  There is no need for concern and it should clear up in a day or so.  SYMPTOMS TO REPORT IMMEDIATELY:   Following lower endoscopy (colonoscopy or flexible sigmoidoscopy):  Excessive amounts of blood in the stool  Significant tenderness or worsening of abdominal pains  Swelling of the abdomen that is new, acute  Fever of 100F or higher   For urgent or emergent issues, a gastroenterologist can be reached at any hour by calling [phone]. Do not use MyChart  messaging for urgent concerns.    DIET:  We do recommend a small meal at first, but then you may proceed to your regular diet.  Drink plenty of fluids but you should avoid alcoholic beverages for 24 hours.  ACTIVITY:  You should plan to take it easy for the rest of today and you should NOT DRIVE or use heavy machinery until tomorrow (because of the sedation medicines used during the test).    FOLLOW UP: Our staff will call the number listed on your records 48-72 hours following your procedure to check on you and address any questions or concerns that you may have regarding the information given to you following your procedure. If we do not reach you, we will leave a message.  We will attempt to reach you two times.  During this call, we will ask if you have developed any symptoms of COVID 19. If you develop any symptoms (ie: fever, flu-like symptoms, shortness of breath, cough etc.) before then, please call [phone].  If you test positive for Covid 19 in the 2 weeks post procedure, please call and report this information to [place].    If any biopsies were taken you will be contacted by phone or by letter within the next 1-3 weeks.  Please call us at [phone] if you have not heard about the biopsies in 3 weeks.    SIGNATURES/CONFIDENTIALITY: You and/or your care partner have signed paperwork which will be entered into your  electronic medical record.  These signatures attest to the fact that that the information above on your After Visit Summary has been reviewed and is understood.  Full responsibility of the confidentiality of this discharge information lies with you and/or your care-partner.

## 2020-10-31 NOTE — Progress Notes (Signed)
VS by CW  I have reviewed the patient's medical history in detail and updated the computerized patient record.  

## 2020-10-31 NOTE — Op Note (Signed)
[place] Patient Name: Peder Allums Procedure Date: 10/31/2020 10:48 AM MRN: [id number] Endoscopist: Gatha Mayer , MD Age: 64 Referring MD:  Date of Birth: 1956-05-21 Gender: Male Account #: [id number] Procedure:                Colonoscopy Indications:              Surveillance: Personal history of adenomatous                            polyps on last colonoscopy 3 years ago, Last                            colonoscopy: 2018 Medicines:                Propofol per Anesthesia, Monitored Anesthesia Care Procedure:                Pre-Anesthesia Assessment:                           - Prior to the procedure, a History and Physical                            was performed, and patient medications and                            allergies were reviewed. The patient's tolerance of                            previous anesthesia was also reviewed. The risks                            and benefits of the procedure and the sedation                            options and risks were discussed with the patient.                            All questions were answered, and informed consent                            was obtained. Prior Anticoagulants: The patient has                            taken no previous anticoagulant or antiplatelet                            agents. ASA Grade Assessment: II - A patient with                            mild systemic disease. After reviewing the risks                            and benefits, the patient was deemed in  satisfactory condition to undergo the procedure.                           After obtaining informed consent, the colonoscope                            was passed under direct vision. Throughout the                            procedure, the patient's blood pressure, pulse, and                            oxygen saturations were monitored continuously. The                            Colonoscope was introduced  through the anus and                            advanced to the the cecum, identified by                            appendiceal orifice and ileocecal valve. The                            colonoscopy was performed without difficulty. The                            patient tolerated the procedure well. The quality                            of the bowel preparation was good. The ileocecal                            valve, appendiceal orifice, and rectum were                            photographed. The bowel preparation used was                            Miralax via split dose instruction. Scope In: 11:03:22 AM Scope Out: 11:24:34 AM Scope Withdrawal Time: 0 hours 16 minutes 40 seconds  Total Procedure Duration: 0 hours 21 minutes 12 seconds  Findings:                 The perianal and digital rectal examinations were                            normal. Pertinent negatives include normal prostate                            (size, shape, and consistency).                           Three sessile polyps were found in the descending  colon and transverse colon. The polyps were                            diminutive in size. These polyps were removed with                            a cold snare. Resection and retrieval were                            complete. Verification of patient identification                            for the specimen was done. Estimated blood loss was                            minimal.                           The exam was otherwise without abnormality on                            direct and retroflexion views. Complications:            No immediate complications. Estimated Blood Loss:     Estimated blood loss was minimal. Impression:               - Three diminutive polyps in the descending colon                            and in the transverse colon, removed with a cold                            snare. Resected and retrieved.                            - The examination was otherwise normal on direct                            and retroflexion views.                           - Personal history of colonic polyps.                           2010 multipl adenomas largest 2.3 cm TV adenoma on                            stalk w/ focal high-grade dysplasia                           2015 4 polyps - all adenomas max 9 mm                           10/2017 4 dimin adenomas Recommendation:           - Patient has a contact number available  for                            emergencies. The signs and symptoms of potential                            delayed complications were discussed with the                            patient. Return to normal activities tomorrow.                            Written discharge instructions were provided to the                            patient.                           - Resume previous diet.                           - Continue present medications.                           - Repeat colonoscopy is recommended for                            surveillance. The colonoscopy date will be                            determined after pathology results from today's                            exam become available for review. Gatha Mayer, MD 10/31/2020 11:32:31 AM This report has been signed electronically.

## 2020-11-02 ENCOUNTER — Telehealth: Payer: Self-pay | Admitting: *Deleted

## 2020-11-02 ENCOUNTER — Telehealth: Payer: Self-pay

## 2020-11-02 NOTE — Telephone Encounter (Signed)
  Follow up Call-  Call back number 10/31/2020  Post procedure Call Back phone  # [phone]  Permission to leave phone message Yes  Some recent data might be hidden     Patient questions:  Message left to call us if necessary.

## 2020-11-02 NOTE — Telephone Encounter (Signed)
  Follow up Call-  Call back number 10/31/2020  Post procedure Call Back phone  # [phone]  Permission to leave phone message Yes  Some recent data might be hidden     Patient questions:  Do you have a fever, pain , or abdominal swelling? No. Pain Score  0 *  Have you tolerated food without any problems? Yes.    Have you been able to return to your normal activities? Yes.    Do you have any questions about your discharge instructions: Diet   No. Medications  No. Follow up visit  No.  Do you have questions or concerns about your Care? No.  Actions: * If pain score is 4 or above: No action needed, pain <4.  1. Have you developed a fever since your procedure? no  2.   Have you had an respiratory symptoms (SOB or cough) since your procedure? no  3.   Have you tested positive for COVID 19 since your procedure no  4.   Have you had any family members/close contacts diagnosed with the COVID 19 since your procedure?  no   If yes to any of these questions please route to Joylene John, RN and Joella Prince, RN

## 2020-11-13 ENCOUNTER — Encounter: Payer: Self-pay | Admitting: Internal Medicine

## 2020-12-17 ENCOUNTER — Ambulatory Visit: Payer: Medicare PPO | Attending: Internal Medicine

## 2020-12-17 ENCOUNTER — Other Ambulatory Visit (HOSPITAL_BASED_OUTPATIENT_CLINIC_OR_DEPARTMENT_OTHER): Payer: Self-pay | Admitting: Internal Medicine

## 2020-12-17 DIAGNOSIS — Z23 Encounter for immunization: Secondary | ICD-10-CM

## 2020-12-17 MED FILL — PFIZER-BIONTECH COVID-19 VA: 30 | 21 days supply | Qty: 0 | Fill #0

## 2020-12-17 NOTE — Progress Notes (Signed)
   Covid-19 Vaccination Clinic  Name:  Chidiebere Wynn    MRN: [id number] DOB: [birth year]  12/17/2020  Mr. Wernette was observed post Covid-19 immunization for 15 minutes without incident. He was provided with Vaccine Information Sheet and instruction to access the V-Safe system.   Mr. Kuenzel was instructed to call 911 with any severe reactions post vaccine: Marland Kitchen Difficulty breathing  . Swelling of face and throat  . A fast heartbeat  . A bad rash all over body  . Dizziness and weakness   Immunizations Administered    Name Date Dose VIS Date Route   Pfizer COVID-19 Vaccine 12/17/2020  1:44 PM 0.3 mL 09/12/2020 Intramuscular   Manufacturer: [place]   Lot: [id number]   NDC: [address]-0

## 2020-12-25 ENCOUNTER — Ambulatory Visit: Payer: Medicare Other

## 2021-01-08 NOTE — Progress Notes (Signed)
[place] Phone: [phone] Subjective:   James [name], am serving as a scribe for Dr. Hulan Saas. This visit occurred during the SARS-CoV-2 public health emergency.  Safety protocols were in place, including screening questions prior to the visit, additional usage of staff PPE, and extensive cleaning of exam room while observing appropriate contact time as indicated for disinfecting solutions.   I'm seeing this patient by the request  of:  Venia Carbon, MD  CC: Left knee pain, right knee pain  [id number]   06/26/2020 Severe knee arthritis bilaterally.  Right greater than left.  Patient did have a Baker's cyst that was aspirated today and did have significant improvement.  Left side seems to be worsening.  We discussed which activities to do which wants to avoid.  Patient would need to consider the possibility of surgical intervention with viscosupplementation failing.  Patient will send a message in 1 month and follow-up with me again in 2 months   Update 01/09/2021 James [name] is a 65 y.o. male coming in with complaint of left knee pain. Patient states that his pain is a constant 6/10 in the knee. Pain is over medial aspect with [name] new injury. Pain increasing for past 2 months. Using Voltaren gel for pain relief.   Bilateral standing knee xray 2018 IMPRESSION: Moderate degenerative narrowing of the right knee centered on the medial joint compartment. Mild beaking of the tibial spines bilaterally greatest medially.     Past Medical History:  Diagnosis Date  . Adenomatous colon polyp 2010  . CAD (coronary artery disease)   . Cataract   . Duodenal ulcer due to Helicobacter pylori 94/49/6759  . Gastric ulcer due to Helicobacter pylori 11/29/3844  . Hx of adenomatous colonic polyps 01/26/2009  . Hyperlipidemia   . MI (myocardial infarction) ([place])   . Tinea versicolor    Past Surgical History:   Procedure Laterality Date  . COLONOSCOPY    . COLONOSCOPY W/ POLYPECTOMY    . CORONARY STENT PLACEMENT     at [facility]  . jaw tumor Left   . POLYPECTOMY    . TONSILLECTOMY  1962   Social History   Socioeconomic History  . Marital status: Married    Spouse name: Not on file  . Number of children: 2  . Years of education: Not on file  . Highest education level: Not on file  Occupational History  . 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 Smoking status: Former Smoker    Quit date: 11/24/1978    Years since quitting: 42.1  . Smokeless tobacco: Never Used  Substance and Sexual Activity  . Alcohol use: Yes    Comment: 1 to 2 beers a month  . Drug use: [name]  . Sexual activity: Not on file  Other Topics Concern  . Not on file  Social History Narrative   Former minor league shortstop      Has living will   Wife is health care POA--children would be alternates   Would accept resuscitation   [name] tube feeds if cognitively unaware   Social Determinants of Health   Financial Resource Strain: Not on file  Food Insecurity: Not on file  Transportation Needs: Not on file  Physical Activity: Not on file  Stress: Not on file  Social Connections: Not on file   [name] Known Allergies Family History  Problem Relation Age of Onset  . Colon cancer Neg Hx   . Stomach cancer Neg Hx   . Esophageal cancer Neg Hx   . Pancreatic cancer Neg Hx   . Rectal cancer Neg Hx      Current Outpatient Medications (Cardiovascular):  .  atorvastatin (LIPITOR) 80 MG tablet, Take 1 tablet (80 mg total) by mouth daily. .  nitroGLYCERIN (NITROSTAT) 0.4 MG SL tablet, Place 1 tablet (0.4 mg total) under the tongue every 5 (five) minutes as needed for chest pain.   Current Outpatient Medications (Analgesics):  .  aspirin EC 81 MG tablet, Take 81 mg by mouth daily.   Current Outpatient Medications (Other):  Marland Kitchen  Coenzyme Q10-Fish  Oil-Vit E (CO-Q 10 OMEGA-3 FISH OIL PO), Take 1 tablet by mouth daily.  .  fluconazole (DIFLUCAN) 150 MG tablet, Take 1 tablet (150 mg total) by mouth once a week. As needed for recurrent rash   Reviewed prior external information including notes and imaging from  primary care provider As well as notes that were available from care everywhere and other healthcare systems.  Past medical history, social, surgical and family history all reviewed in electronic medical record.  [name] pertanent information unless stated regarding to the chief complaint.   Review of Systems:  [name] headache, visual changes, nausea, vomiting, diarrhea, constipation, dizziness, abdominal pain, skin rash, fevers, chills, night sweats, weight loss, swollen lymph nodes, body aches chest pain, shortness of breath, mood changes. POSITIVE muscle aches, joint swelling  Objective  Blood pressure 120/80, pulse 96, height 5\' 9"  (1.753 m), weight 222 lb (100.7 kg), SpO2 99 %.   General: [name] apparent distress alert and oriented x3 mood and affect normal, dressed appropriately.  HEENT: Pupils equal, extraocular movements intact  Respiratory: Patient's speak in full sentences and does not appear short of breath  Cardiovascular: [name] lower extremity edema, non tender, [name] erythema  Gait antalgic gait MSK: Patient does have the arthritic changes of the knees bilaterally.  Left knee significant increase in discomfort over the medial joint line.  Patient does have instability with valgus and varus force.  Trace effusion noted of the patellofemoral joint bilaterally.  Limited musculoskeletal ultrasound was performed and interpreted by Lyndal Pulley  Ultrasound of patient's medial joint space of the left knee shows that patient does have a meniscus injury noted.  Seems to be acute on chronic.  Moderate narrowing of the patellofemoral joint also noted.  Narrowing of the medial joint space noted.  Small Baker's cyst noted on the left  side. Impression: Knee arthritis with acute on chronic medial meniscal tear and small Baker's cyst.  After informed written and verbal consent, patient was seated on exam table. Right knee was prepped with alcohol swab and utilizing anterolateral approach, patient's right knee space was injected with 4:1  marcaine 0.5%: Kenalog 40mg /dL. Patient tolerated the procedure well without immediate complications.  After informed written and verbal consent, patient was seated on exam table. Left knee was prepped with alcohol swab and utilizing anterolateral approach, patient's left knee space was injected with 4:1  marcaine 0.5%: Kenalog 40mg /dL. Patient tolerated the procedure well without immediate complications.    Impression and Recommendations:     The above documentation has been reviewed and is accurate and complete Lyndal Pulley, DO

## 2021-01-09 ENCOUNTER — Ambulatory Visit: Payer: Medicare PPO | Admitting: Family Medicine

## 2021-01-09 ENCOUNTER — Other Ambulatory Visit: Payer: Self-pay

## 2021-01-09 ENCOUNTER — Encounter: Payer: Self-pay | Admitting: Family Medicine

## 2021-01-09 ENCOUNTER — Ambulatory Visit: Payer: Self-pay

## 2021-01-09 VITALS — BP 120/80 | HR 96 | Ht 69.0 in | Wt 222.0 lb

## 2021-01-09 DIAGNOSIS — M25561 Pain in right knee: Secondary | ICD-10-CM | POA: Diagnosis not present

## 2021-01-09 DIAGNOSIS — M170 Bilateral primary osteoarthritis of knee: Secondary | ICD-10-CM | POA: Diagnosis not present

## 2021-01-09 DIAGNOSIS — G8929 Other chronic pain: Secondary | ICD-10-CM | POA: Diagnosis not present

## 2021-01-09 DIAGNOSIS — M25562 Pain in left knee: Secondary | ICD-10-CM | POA: Diagnosis not present

## 2021-01-09 NOTE — Assessment & Plan Note (Signed)
Worsening pain again.  Discussed with patient about icing regimen and home exercises.  Baker's cyst is noted of the left knee but not significantly large.  We discussed potential advanced imaging of the left knee if this continues secondary to the instability and the meniscal injury noted today.  Patient would want to avoid surgical intervention and hopefully patient will respond.  We will get viscosupplementation approved as well.  Follow-up with me again in 6 weeks We did discuss in the interim if any worsening swelling, worsening leg pain that patient needs to seek medical attention immediately.  Patient is in agreement with the plan

## 2021-01-09 NOTE — Patient Instructions (Signed)
Good to see you If left knee not better I consider an MRI See me again in 6 weeks

## 2021-01-31 ENCOUNTER — Encounter: Payer: Self-pay | Admitting: Family Medicine

## 2021-02-06 ENCOUNTER — Other Ambulatory Visit: Payer: Self-pay

## 2021-02-06 ENCOUNTER — Encounter: Payer: Self-pay | Admitting: Internal Medicine

## 2021-02-06 ENCOUNTER — Ambulatory Visit: Payer: Medicare PPO | Admitting: Internal Medicine

## 2021-02-06 DIAGNOSIS — H02846 Edema of left eye, unspecified eyelid: Secondary | ICD-10-CM | POA: Insufficient documentation

## 2021-02-06 MED ORDER — CEPHALEXIN 500 MG PO CAPS: 500.0000 mg | ORAL_CAPSULE | Freq: Three times a day (TID) | ORAL | 0 refills | Status: DC

## 2021-02-06 NOTE — Progress Notes (Signed)
Subjective:    Patient ID: James [name], male    DOB: [birth year], 65 y.o.   MRN: [id number]  HPI Here due to left eyelid swelling This visit occurred during the SARS-CoV-2 public health emergency.  Safety protocols were in place, including screening questions prior to the visit, additional usage of staff PPE, and extensive cleaning of exam room while observing appropriate contact time as indicated for disinfecting solutions.   Was at daughter's house 3 days ago Contact with dogs--but no injury Then someone noticed left eyelid was swollen with some redness He has no pain Some itching No eye discharge---but does have some chronic matter  Tried hot compresses--no help  Current Outpatient Medications on File Prior to Visit  Medication Sig Dispense Refill  . aspirin EC 81 MG tablet Take 81 mg by mouth daily.    Marland Kitchen atorvastatin (LIPITOR) 80 MG tablet Take 1 tablet (80 mg total) by mouth daily. 90 tablet 3  . Coenzyme Q10-Fish Oil-Vit E (CO-Q 10 OMEGA-3 FISH OIL PO) Take 1 tablet by mouth daily.     . fluconazole (DIFLUCAN) 150 MG tablet Take 1 tablet (150 mg total) by mouth once a week. As needed for recurrent rash 4 tablet 5  . nitroGLYCERIN (NITROSTAT) 0.4 MG SL tablet Place 1 tablet (0.4 mg total) under the tongue every 5 (five) minutes as needed for chest pain. 25 tablet 1   No current facility-administered medications on file prior to visit.    No Known Allergies  Past Medical History:  Diagnosis Date  . Adenomatous colon polyp 2010  . CAD (coronary artery disease)   . Cataract   . Duodenal ulcer due to Helicobacter pylori 64/33/2951  . Gastric ulcer due to Helicobacter pylori 07/01/4165  . Hx of adenomatous colonic polyps 01/26/2009  . Hyperlipidemia   . MI (myocardial infarction) ([place])   . Tinea versicolor     Past Surgical History:  Procedure Laterality Date  . COLONOSCOPY    . COLONOSCOPY W/ POLYPECTOMY    . CORONARY STENT PLACEMENT     at [facility]  . jaw tumor  Left   . POLYPECTOMY    . TONSILLECTOMY  1962    Family History  Problem Relation Age of Onset  . Colon cancer Neg Hx   . Stomach cancer Neg Hx   . Esophageal cancer Neg Hx   . Pancreatic cancer Neg Hx   . Rectal cancer Neg Hx     Social History   Socioeconomic History  . Marital status: Married    Spouse name: Not on file  . Number of children: 2  . Years of education: Not on file  . Highest education level: Not on file  Occupational History  . 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 Smoking status: Former Smoker    Quit date: 11/24/1978    Years since quitting: 42.2  . Smokeless tobacco: Never Used  Substance and Sexual Activity  . Alcohol use: Yes    Comment: 1 to 2 beers a month  . Drug use: No  . Sexual activity: Not on file  Other Topics Concern  . Not on file  Social History Narrative   Former minor league shortstop      Has living will   Wife is health care POA--children would be alternates   Would accept resuscitation   No tube feeds if cognitively unaware   Social Determinants of Health  Financial Resource Strain: Not on file  Food Insecurity: Not on file  Transportation Needs: Not on file  Physical Activity: Not on file  Stress: Not on file  Social Connections: Not on file  Intimate Partner Violence: Not on file   Review of Systems Vision is fine No fever    Objective:   Physical Exam Constitutional:      Appearance: Normal appearance.  Eyes:     Comments: Mild redness and swelling of left upper lid No chalazion or hordelum ?slight tenderness  Neurological:     Mental Status: He is alert.            Assessment & Plan:

## 2021-02-06 NOTE — Assessment & Plan Note (Addendum)
Not clearly an infection--or allergic reaction----but could be either Will try a few days of cephalexin Try cool compresses Consider an antihistamine---but too localized to expect true allergic reaction Consider ophthal appt if persist

## 2021-03-26 NOTE — Progress Notes (Signed)
[place] Phone: [phone] Subjective:   Fontaine No, am serving as a scribe for Dr. Hulan Saas. This visit occurred during the SARS-CoV-2 public health emergency.  Safety protocols were in place, including screening questions prior to the visit, additional usage of staff PPE, and extensive cleaning of exam room while observing appropriate contact time as indicated for disinfecting solutions.   I'm seeing this patient by the request  of:  Venia Carbon, MD  CC: Knee pain follow-up  [id number]   01/09/2021 Worsening pain again.  Discussed with patient about icing regimen and home exercises.  Baker's cyst is noted of the left knee but not significantly large.  We discussed potential advanced imaging of the left knee if this continues secondary to the instability and the meniscal injury noted today.  Patient would want to avoid surgical intervention and hopefully patient will respond.  We will get viscosupplementation approved as well.  Follow-up with me again in 6 weeks We did discuss in the interim if any worsening swelling, worsening leg pain that patient needs to seek medical attention immediately.  Patient is in agreement with the plan  Update 03/27/2021 Daejon Lich is a 65 y.o. male coming in with complaint of B knee pain. Patient states that his R knee is giving out on him 3-4x a day. No new injury.  Patient has not been wearing the brace.  Does not remember any true injury.  Feels like though there is increased swelling recently.  L knee pain continues over medial aspect. Would like gel injection.  Patient states it is better since the previous injection but very minorly.  Feels like it is actually worsening again slowly but still better than the last steroid injection.     Past Medical History:  Diagnosis Date  . Adenomatous colon polyp 2010  . CAD (coronary artery disease)   . Cataract   .  Duodenal ulcer due to Helicobacter pylori 37/62/8315  . Gastric ulcer due to Helicobacter pylori 11/30/6158  . Hx of adenomatous colonic polyps 01/26/2009  . Hyperlipidemia   . MI (myocardial infarction) ([place])   . Tinea versicolor    Past Surgical History:  Procedure Laterality Date  . COLONOSCOPY    . COLONOSCOPY W/ POLYPECTOMY    . CORONARY STENT PLACEMENT     at [facility]  . jaw tumor Left   . POLYPECTOMY    . TONSILLECTOMY  1962   Social History   Socioeconomic History  . Marital status: Married    Spouse name: Not on file  . Number of children: 2  . Years of education: Not on file  . Highest education level: Not on file  Occupational History  . 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 Smoking status: Former Smoker    Quit date: 11/24/1978    Years since quitting: 42.3  . Smokeless tobacco: Never Used  Substance and Sexual Activity  . Alcohol use: Yes    Comment: 1 to 2 beers a month  . Drug use: No  . Sexual activity: Not on file  Other Topics Concern  . Not on file  Social History Narrative   Former minor league shortstop      Has living will   Wife is health care POA--children would be alternates   Would accept resuscitation   No tube feeds if cognitively unaware  Social Determinants of Health   Financial Resource Strain: Not on file  Food Insecurity: Not on file  Transportation Needs: Not on file  Physical Activity: Not on file  Stress: Not on file  Social Connections: Not on file   No Known Allergies Family History  Problem Relation Age of Onset  . Colon cancer Neg Hx   . Stomach cancer Neg Hx   . Esophageal cancer Neg Hx   . Pancreatic cancer Neg Hx   . Rectal cancer Neg Hx      Current Outpatient Medications (Cardiovascular):  .  atorvastatin (LIPITOR) 80 MG tablet, Take 1 tablet (80 mg total) by mouth daily. .  nitroGLYCERIN (NITROSTAT) 0.4 MG SL tablet, Place 1 tablet  (0.4 mg total) under the tongue every 5 (five) minutes as needed for chest pain.   Current Outpatient Medications (Analgesics):  .  aspirin EC 81 MG tablet, Take 81 mg by mouth daily.   Current Outpatient Medications (Other):  .  cephALEXin (KEFLEX) 500 MG capsule, Take 1 capsule (500 mg total) by mouth 3 (three) times daily. .  Coenzyme Q10-Fish Oil-Vit E (CO-Q 10 OMEGA-3 FISH OIL PO), Take 1 tablet by mouth daily.  Marland Kitchen  COVID-19 mRNA vaccine, Pfizer, 30 MCG/0.3ML injection, INJECT AS DIRECTED .  fluconazole (DIFLUCAN) 150 MG tablet, Take 1 tablet (150 mg total) by mouth once a week. As needed for recurrent rash   Reviewed prior external information including notes and imaging from  primary care provider As well as notes that were available from care everywhere and other healthcare systems.  Past medical history, social, surgical and family history all reviewed in electronic medical record.  No pertanent information unless stated regarding to the chief complaint.   Review of Systems:  No headache, visual changes, nausea, vomiting, diarrhea, constipation, dizziness, abdominal pain, skin rash, fevers, chills, night sweats, weight loss, swollen lymph nodes, body aches, joint swelling, chest pain, shortness of breath, mood changes. POSITIVE muscle aches  Objective  Blood pressure 104/70, pulse 67, height 5\' 9"  (1.753 m), weight 218 lb (98.9 kg), SpO2 98 %.   General: No apparent distress alert and oriented x3 mood and affect normal, dressed appropriately.  HEENT: Pupils equal, extraocular movements intact  Respiratory: Patient's speak in full sentences and does not appear short of breath  Cardiovascular: No lower extremity edema, non tender, no erythema  Gait antalgic gait noted. Right knee does have instability with valgus and varus force.  Patient does have swelling noted.  Limited extension and flexion noted.  Patient does have also fullness noted in the popliteal area. Left knee  arthritic changes.  No significant swelling.  Instability with valgus and varus force.  Procedure: Real-time Ultrasound Guided Injection of right knee Device: GE Logiq Q7 Ultrasound guided injection is preferred based studies that show increased duration, increased effect, greater accuracy, decreased procedural pain, increased response rate, and decreased cost with ultrasound guided versus blind injection.  Verbal informed consent obtained.  Time-out conducted.  Noted no overlying erythema, induration, or other signs of local infection.  Skin prepped in a sterile fashion.  Local anesthesia: Topical Ethyl chloride.  With sterile technique and under real time ultrasound guidance: With a 22-gauge 2 inch needle patient was injected with 4 cc of 0.5% Marcaine and aspirated 20 cc of straw light-colored fluid then injected 1 cc of Kenalog 40 mg/mL from the posterior approach Completed without difficulty  Pain immediately resolved suggesting accurate placement of the medication.  Advised to call if fevers/chills,  erythema, induration, drainage, or persistent bleeding.  Impression: Technically successful ultrasound guided injection.  After informed verbal consent, patient was seated on exam table. Left knee was prepped with alcohol swab and utilizing anterolateral approach, patient's left knee space was injected with 40 mg per 3 mL of Monovisc (sodium hyaluronate) in a prefilled syringe was injected easily into the knee through a 22-gauge needle..Patient tolerated the procedure well without immediate complications.   Impression and Recommendations:     The above documentation has been reviewed and is accurate and complete Lyndal Pulley, DO

## 2021-03-27 ENCOUNTER — Ambulatory Visit (INDEPENDENT_AMBULATORY_CARE_PROVIDER_SITE_OTHER): Payer: Medicare PPO

## 2021-03-27 ENCOUNTER — Ambulatory Visit: Payer: Medicare PPO | Admitting: Family Medicine

## 2021-03-27 ENCOUNTER — Encounter: Payer: Self-pay | Admitting: Family Medicine

## 2021-03-27 ENCOUNTER — Ambulatory Visit: Payer: Self-pay

## 2021-03-27 ENCOUNTER — Other Ambulatory Visit: Payer: Self-pay

## 2021-03-27 VITALS — BP 104/70 | HR 67 | Ht 69.0 in | Wt 218.0 lb

## 2021-03-27 DIAGNOSIS — G8929 Other chronic pain: Secondary | ICD-10-CM

## 2021-03-27 DIAGNOSIS — M25561 Pain in right knee: Secondary | ICD-10-CM | POA: Diagnosis not present

## 2021-03-27 DIAGNOSIS — M170 Bilateral primary osteoarthritis of knee: Secondary | ICD-10-CM | POA: Diagnosis not present

## 2021-03-27 DIAGNOSIS — M25562 Pain in left knee: Secondary | ICD-10-CM | POA: Diagnosis not present

## 2021-03-27 NOTE — Assessment & Plan Note (Signed)
Patient given viscosupplementation on the left knee.  Draining the right knee.  Discussed which activities to doing which wants to avoid.  Patient will come back in 4 to 6 weeks and likely will do gel on the contralateral knee.  May need aspiration of the patellofemoral joint at that point.

## 2021-03-27 NOTE — Patient Instructions (Signed)
Xray today Gel L knee  Drain R knee See me in 4-6 weeks to go gel on R knee

## 2021-04-03 DIAGNOSIS — B360 Pityriasis versicolor: Secondary | ICD-10-CM | POA: Diagnosis not present

## 2021-04-03 DIAGNOSIS — Z9189 Other specified personal risk factors, not elsewhere classified: Secondary | ICD-10-CM | POA: Diagnosis not present

## 2021-04-12 DIAGNOSIS — I739 Peripheral vascular disease, unspecified: Secondary | ICD-10-CM | POA: Diagnosis not present

## 2021-04-12 DIAGNOSIS — I2510 Atherosclerotic heart disease of native coronary artery without angina pectoris: Secondary | ICD-10-CM | POA: Diagnosis not present

## 2021-04-19 ENCOUNTER — Ambulatory Visit: Payer: Medicare PPO | Admitting: Family Medicine

## 2021-05-03 NOTE — Progress Notes (Signed)
James [name] Phone: [phone] Subjective:   I James [name] am serving as a Education administrator for Dr. Hulan Saas.  This visit occurred during the SARS-CoV-2 public health emergency.  Safety protocols were in place, including screening questions prior to the visit, additional usage of staff PPE, and extensive cleaning of exam room while observing appropriate contact time as indicated for disinfecting solutions.   I'm seeing this patient by the request  of:  Venia Carbon, MD  CC: Knee pain follow-up  [id number]  03/27/2021 Patient given viscosupplementation on the left knee.  Draining the right knee.  Discussed which activities to doing which wants to avoid.  Patient will come back in 4 to 6 weeks and likely will do gel on the contralateral knee.  May need aspiration of the patellofemoral joint at that point.  Update 05/06/2021 James [name] is a 65 y.o. male coming in with complaint of B knee pain.  Patient was given viscosupplementation on the left side at last exam.  Patient states his knees feel bad and would like the injections today.  Continues to have pain fairly regularly.      Past Medical History:  Diagnosis Date   Adenomatous colon polyp 2010   CAD (coronary artery disease)    Cataract    Duodenal ulcer due to Helicobacter pylori 23/30/0762   Gastric ulcer due to Helicobacter pylori 12/30/3333   Hx of adenomatous colonic polyps 01/26/2009   Hyperlipidemia    MI (myocardial infarction) ([place])    Tinea versicolor    Past Surgical History:  Procedure Laterality Date   COLONOSCOPY     COLONOSCOPY W/ POLYPECTOMY     CORONARY STENT PLACEMENT     at [place]   jaw tumor Left    POLYPECTOMY     TONSILLECTOMY  1962   Social History   Socioeconomic History   Marital status: Married    Spouse name: Not on file   Number of children: 2   Years of education: Not on file   Highest education level: Not  on file  Occupational History   Occupation: Hotel manager    Employer: [employer]: Retired  Tobacco Use   Smoking status: Former    Pack years: 0.00    Types: Cigarettes    Quit date: 11/24/1978    Years since quitting: 42.4   Smokeless tobacco: Never  Substance and Sexual Activity   Alcohol use: Yes    Comment: 1 to 2 beers a month   Drug use: No   Sexual activity: Not on file  Other Topics Concern   Not on file  Social History Narrative   Former minor league shortstop      Has living will   Wife is health care POA--children would be alternates   Would accept resuscitation   No tube feeds if cognitively unaware   Social Determinants of Health   Financial Resource Strain: Not on file  Food Insecurity: Not on file  Transportation Needs: Not on file  Physical Activity: Not on file  Stress: Not on file  Social Connections: Not on file   No Known Allergies Family History  Problem Relation Age of Onset   Colon cancer Neg Hx    Stomach cancer Neg Hx    Esophageal cancer Neg Hx    Pancreatic cancer Neg Hx    Rectal cancer Neg Hx      Current Outpatient  Medications (Cardiovascular):    atorvastatin (LIPITOR) 80 MG tablet, Take 1 tablet (80 mg total) by mouth daily.   nitroGLYCERIN (NITROSTAT) 0.4 MG SL tablet, Place 1 tablet (0.4 mg total) under the tongue every 5 (five) minutes as needed for chest pain.   Current Outpatient Medications (Analgesics):    aspirin EC 81 MG tablet, Take 81 mg by mouth daily.   Current Outpatient Medications (Other):    cephALEXin (KEFLEX) 500 MG capsule, Take 1 capsule (500 mg total) by mouth 3 (three) times daily.   Coenzyme Q10-Fish Oil-Vit E (CO-Q 10 OMEGA-3 FISH OIL PO), Take 1 tablet by mouth daily.    COVID-19 mRNA vaccine, Pfizer, 30 MCG/0.3ML injection, INJECT AS DIRECTED   fluconazole (DIFLUCAN) 150 MG tablet, Take 1 tablet (150 mg total) by mouth once a week. As needed for recurrent  rash   Reviewed prior external information including notes and imaging from  primary care provider As well as notes that were available from care everywhere and other healthcare systems.  Past medical history, social, surgical and family history all reviewed in electronic medical record.  No pertanent information unless stated regarding to the chief complaint.   Review of Systems:  No headache, visual changes, nausea, vomiting, diarrhea, constipation, dizziness, abdominal pain, skin rash, fevers, chills, night sweats, weight loss, swollen lymph nodes, body aches,  chest pain, shortness of breath, mood changes. POSITIVE muscle aches, joint swelling  Objective  Blood pressure 114/82, pulse 60, height 5\' 9"  (1.753 m), weight 218 lb (98.9 kg), SpO2 98 %.   General: No apparent distress alert and oriented x3 mood and affect normal, dressed appropriately.  HEENT: Pupils equal, extraocular movements intact  Respiratory: Patient's speak in full sentences and does not appear short of breath  Cardiovascular: Trace lower extremity edema, non tender, no erythema  Gait mild antalgic does have arthritic changes of the knees bilaterally. Right knee exam shows the patient does have arthritic changes.  Instability with valgus and varus force.  The patient does have range of motion lacking 5 degrees of extension and 10 degrees of flexion.  After informed written and verbal consent, patient was seated on exam table. Right knee was prepped with alcohol swab and utilizing anterolateral approach, patient's right knee space was injected with 48 mg per 3 mL of Monovisc (sodium hyaluronate) in a prefilled syringe was injected easily into the knee through a 22-gauge needle..Patient tolerated the procedure well without immediate complications.   Impression and Recommendations:    The above documentation has been reviewed and is accurate and complete Lyndal Pulley, DO

## 2021-05-06 ENCOUNTER — Other Ambulatory Visit: Payer: Self-pay

## 2021-05-06 ENCOUNTER — Ambulatory Visit (INDEPENDENT_AMBULATORY_CARE_PROVIDER_SITE_OTHER): Payer: Medicare PPO | Admitting: Family Medicine

## 2021-05-06 ENCOUNTER — Encounter: Payer: Self-pay | Admitting: Family Medicine

## 2021-05-06 DIAGNOSIS — I2510 Atherosclerotic heart disease of native coronary artery without angina pectoris: Secondary | ICD-10-CM | POA: Diagnosis not present

## 2021-05-06 DIAGNOSIS — I739 Peripheral vascular disease, unspecified: Secondary | ICD-10-CM | POA: Diagnosis not present

## 2021-05-06 DIAGNOSIS — M170 Bilateral primary osteoarthritis of knee: Secondary | ICD-10-CM

## 2021-05-06 NOTE — Assessment & Plan Note (Signed)
Viscosupplementation given for the right knee.  We will see how patient responds.  Discussed icing regimen.  Had some acute exacerbation.  Patient did have some mild pain with the injection today as well and will monitor.  Increase activity slowly.  Follow-up again in 6 to 8 weeks.

## 2021-05-06 NOTE — Patient Instructions (Addendum)
Good to see you Sorry I made you cry  See me again in 6 weeks

## 2021-06-11 ENCOUNTER — Telehealth: Payer: Medicare PPO | Admitting: Internal Medicine

## 2021-06-19 ENCOUNTER — Ambulatory Visit: Payer: Medicare PPO | Admitting: Family Medicine

## 2021-06-28 ENCOUNTER — Ambulatory Visit: Payer: Self-pay

## 2021-06-28 ENCOUNTER — Ambulatory Visit (INDEPENDENT_AMBULATORY_CARE_PROVIDER_SITE_OTHER): Payer: Medicare PPO | Admitting: Internal Medicine

## 2021-06-28 ENCOUNTER — Ambulatory Visit: Payer: Medicare PPO | Admitting: Family Medicine

## 2021-06-28 ENCOUNTER — Encounter: Payer: Self-pay | Admitting: Family Medicine

## 2021-06-28 ENCOUNTER — Encounter: Payer: Self-pay | Admitting: Internal Medicine

## 2021-06-28 ENCOUNTER — Other Ambulatory Visit: Payer: Self-pay

## 2021-06-28 VITALS — BP 110/72 | HR 60 | Ht 69.5 in | Wt 219.0 lb

## 2021-06-28 VITALS — BP 106/66 | HR 59 | Temp 97.7°F | Ht 69.5 in | Wt 215.0 lb

## 2021-06-28 DIAGNOSIS — I2510 Atherosclerotic heart disease of native coronary artery without angina pectoris: Secondary | ICD-10-CM | POA: Diagnosis not present

## 2021-06-28 DIAGNOSIS — B360 Pityriasis versicolor: Secondary | ICD-10-CM | POA: Diagnosis not present

## 2021-06-28 DIAGNOSIS — Z0000 Encounter for general adult medical examination without abnormal findings: Secondary | ICD-10-CM | POA: Diagnosis not present

## 2021-06-28 DIAGNOSIS — M7122 Synovial cyst of popliteal space [Baker], left knee: Secondary | ICD-10-CM | POA: Diagnosis not present

## 2021-06-28 DIAGNOSIS — M2550 Pain in unspecified joint: Secondary | ICD-10-CM

## 2021-06-28 DIAGNOSIS — Z7189 Other specified counseling: Secondary | ICD-10-CM | POA: Diagnosis not present

## 2021-06-28 DIAGNOSIS — M159 Polyosteoarthritis, unspecified: Secondary | ICD-10-CM | POA: Diagnosis not present

## 2021-06-28 DIAGNOSIS — M25562 Pain in left knee: Secondary | ICD-10-CM

## 2021-06-28 LAB — COMPREHENSIVE METABOLIC PANEL
ALT: 15 U/L (ref 0–53)
AST: 19 U/L (ref 0–37)
Albumin: 4.3 g/dL (ref 3.5–5.2)
Alkaline Phosphatase: 72 U/L (ref 39–117)
BUN: 13 mg/dL (ref 6–23)
CO2: 28 mEq/L (ref 19–32)
Calcium: 9.6 mg/dL (ref 8.4–10.5)
Chloride: 107 mEq/L (ref 96–112)
Creatinine, Ser: 1.06 mg/dL (ref 0.40–1.50)
GFR: 72.82 mL/min (ref >60.00)
Glucose, Bld: 82 mg/dL (ref 70–99)
Potassium: 4 mEq/L (ref 3.5–5.1)
Sodium: 140 mEq/L (ref 135–145)
Total Bilirubin: 0.6 mg/dL (ref 0.2–1.2)
Total Protein: 7.1 g/dL (ref 6.0–8.3)

## 2021-06-28 LAB — CBC
HCT: 45.2 % (ref 39.0–52.0)
Hemoglobin: 14.4 g/dL (ref 13.0–17.0)
MCHC: 31.9 g/dL (ref 30.0–36.0)
MCV: 90.1 fl (ref 78.0–100.0)
Platelets: 243 10*3/uL (ref 150.0–400.0)
RBC: 5.01 Mil/uL (ref 4.22–5.81)
RDW: 13 % (ref 11.5–15.5)
WBC: 4.9 10*3/uL (ref 4.0–10.5)

## 2021-06-28 LAB — LIPID PANEL
Cholesterol: 125 mg/dL (ref 0–200)
HDL: 37.7 mg/dL — ABNORMAL LOW (ref >39.00)
LDL Cholesterol: 59 mg/dL (ref 0–99)
NonHDL: 87.59
Total CHOL/HDL Ratio: 3
Triglycerides: 143 mg/dL (ref 0.0–149.0)
VLDL: 28.6 mg/dL (ref 0.0–40.0)

## 2021-06-28 NOTE — Assessment & Plan Note (Signed)
See social history 

## 2021-06-28 NOTE — Patient Instructions (Addendum)
Good to see you  Good luck with your round tomorrow Might want to wear compression sleeve on knee Ice with any pain  See me again in 6 weeks

## 2021-06-28 NOTE — Assessment & Plan Note (Signed)
I have personally reviewed the Medicare Annual Wellness questionnaire and have noted 1. The patient's medical and social history 2. Their use of alcohol, tobacco or illicit drugs 3. Their current medications and supplements 4. The patient's functional ability including ADL's, fall risks, home safety risks and hearing or visual             impairment. 5. Diet and physical activities 6. Evidence for depression or mood disorders  The patients weight, height, BMI and visual acuity have been recorded in the chart I have made referrals, counseling and provided education to the patient based review of the above and I have provided the pt with a written personalized care plan for preventive services.  I have provided you with a copy of your personalized plan for preventive services. Please take the time to review along with your updated medication list.  Colon due again 2024 Will defer PSA to next year Still prefers no flu or pneumonia vaccines Will get shingrix at pharmacy Will need second COVID vaccine (covalent)

## 2021-06-28 NOTE — Assessment & Plan Note (Signed)
Uses weekly fluconazole for flares

## 2021-06-28 NOTE — Assessment & Plan Note (Signed)
No symptoms No the atorvastatin and ASA

## 2021-06-28 NOTE — Progress Notes (Signed)
Hearing Screening  Method: Audiometry   '500Hz'$  '1000Hz'$  '2000Hz'$  '4000Hz'$   Right ear '20 20 20 20  '$ Left ear '20 20 20 20  '$ Vision Screening - Comments:: Fall 2021

## 2021-06-28 NOTE — Progress Notes (Signed)
Subjective:    Patient ID: James [name], male    DOB: [birth year], 65 y.o.   MRN: [id number]  HPI Here for Medicare wellness visit and follow up of chronic health conditions This visit occurred during the SARS-CoV-2 public health emergency.  Safety protocols were in place, including screening questions prior to the visit, additional usage of staff PPE, and extensive cleaning of exam room while observing appropriate contact time as indicated for disinfecting solutions.   Reviewed form and advanced directives Reviewed other doctors No tobacco Occasional drink of alcohol Exercises some--but not a lot Vision is fine (still has small lump in lower left lid) Hearing is fine No falls No depression or anhedonia Independent with instrumental ADLs No memory problems  No heart problems No chest pain or SOB No dizziness or syncope No palpitation No edema  Uses fluconazole when needed (a few weeks at a time)  Some joint issues Not taking medications  Current Outpatient Medications on File Prior to Visit  Medication Sig Dispense Refill   aspirin EC 81 MG tablet Take 81 mg by mouth daily.     atorvastatin (LIPITOR) 80 MG tablet Take 1 tablet (80 mg total) by mouth daily. 90 tablet 3   fluconazole (DIFLUCAN) 150 MG tablet Take 1 tablet (150 mg total) by mouth once a week. As needed for recurrent rash 4 tablet 5   nitroGLYCERIN (NITROSTAT) 0.4 MG SL tablet Place 1 tablet (0.4 mg total) under the tongue every 5 (five) minutes as needed for chest pain. 25 tablet 1   No current facility-administered medications on file prior to visit.    No Known Allergies  Past Medical History:  Diagnosis Date   Adenomatous colon polyp 2010   CAD (coronary artery disease)    Cataract    Duodenal ulcer due to Helicobacter pylori 0000000   Gastric ulcer due to Helicobacter pylori [id number]   Hx of adenomatous colonic polyps 01/26/2009   Hyperlipidemia    MI (myocardial infarction) ([place])    Tinea  versicolor     Past Surgical History:  Procedure Laterality Date   COLONOSCOPY     COLONOSCOPY W/ POLYPECTOMY     CORONARY STENT PLACEMENT     at [place]   jaw tumor Left    POLYPECTOMY     TONSILLECTOMY  1962    Family History  Problem Relation Age of Onset   Colon cancer Neg Hx    Stomach cancer Neg Hx    Esophageal cancer Neg Hx    Pancreatic cancer Neg Hx    Rectal cancer Neg Hx     Social History   Socioeconomic History   Marital status: Married    Spouse name: Not on file   Number of children: 2   Years of education: Not on file   Highest education level: Not on file  Occupational History   Occupation: Hotel manager    Employer: [employer]    Comment: Retired  Tobacco Use   Smoking status: Former    Types: Cigarettes    Quit date: 11/24/1978    Years since quitting: 42.6   Smokeless tobacco: Never  Substance and Sexual Activity   Alcohol use: Yes    Comment: 1 to 2 beers a month   Drug use: No   Sexual activity: Not on file  Other Topics Concern   Not on file  Social History Narrative   Former minor league shortstop      Has living will  Wife is health care POA--children would be alternates   Would accept resuscitation   No tube feeds if cognitively unaware   Social Determinants of Health   Financial Resource Strain: Not on file  Food Insecurity: Not on file  Transportation Needs: Not on file  Physical Activity: Not on file  Stress: Not on file  Social Connections: Not on file  Intimate Partner Violence: Not on file   Review of Systems Appetite is fine Weight down 5# Sleeps fine Wears seat belt Teeth are okay---keeps up with dentist No suspicious skin lesions No heartburn or dysphagia Bowels are fine--no blood Voids okay. Stream is okay. Nocturia x 1     Objective:   Physical Exam Constitutional:      Appearance: Normal appearance.  HENT:     Mouth/Throat:     Pharynx: No oropharyngeal exudate or  posterior oropharyngeal erythema.     Comments: No lesions Eyes:     Conjunctiva/sclera: Conjunctivae normal.     Pupils: Pupils are equal, round, and reactive to light.  Cardiovascular:     Rate and Rhythm: Normal rate and regular rhythm.     Pulses: Normal pulses.     Heart sounds: No murmur heard.   No gallop.  Pulmonary:     Effort: Pulmonary effort is normal.     Breath sounds: Normal breath sounds. No wheezing or rales.  Abdominal:     Palpations: Abdomen is soft.     Tenderness: There is no abdominal tenderness.  Musculoskeletal:     Cervical back: Neck supple.     Right lower leg: No edema.     Left lower leg: No edema.  Lymphadenopathy:     Cervical: No cervical adenopathy.  Skin:    General: Skin is warm.     Findings: No rash.  Neurological:     Mental Status: He is alert and oriented to person, place, and time.     Comments: Altames[employer] Cabal" [phone] [id number] Recall 3/3  Psychiatric:        Mood and Affect: Mood normal.        Behavior: Behavior normal.           Assessment & Plan:

## 2021-06-28 NOTE — Assessment & Plan Note (Signed)
Discussed tylenol (or rare aleve) prn

## 2021-06-28 NOTE — Progress Notes (Signed)
[name] [place] Phone: [phone] Subjective:   James [name], am serving as a scribe for Dr. Hulan Saas.  I'm seeing this patient by the request  of:  Venia Carbon, MD  CC: Knee pain follow-up  [id number]  05/06/2021 Viscosupplementation given for the right knee.  We will see how patient responds.  Discussed icing regimen.  Had some acute exacerbation.  Patient did have some mild pain with the injection today as well and will monitor.  Increase activity slowly.  Follow-up again in 6 to 8 weeks.  Update 06/28/2021 James [name] is a 65 y.o. male coming in with complaint of B knee pain. R knee, Monovisc injection last visit. Patient states that left knee feeling worse than last time. States that the left knee has a different area of pain behind the knee when he extends his leg.      Past Medical History:  Diagnosis Date   Adenomatous colon polyp 2010   CAD (coronary artery disease)    Cataract    Duodenal ulcer due to Helicobacter pylori 0000000   Gastric ulcer due to Helicobacter pylori [id number]   Hx of adenomatous colonic polyps 01/26/2009   Hyperlipidemia    MI (myocardial infarction) ([place])    Tinea versicolor    Past Surgical History:  Procedure Laterality Date   COLONOSCOPY     COLONOSCOPY W/ POLYPECTOMY     CORONARY STENT PLACEMENT     at [place]   jaw tumor Left    POLYPECTOMY     TONSILLECTOMY  1962   Social History   Socioeconomic History   Marital status: Married    Spouse name: Not on file   Number of children: 2   Years of education: Not on file   Highest education level: Not on file  Occupational History   Occupation: Hotel manager    Employer: [employer]: Retired  Tobacco Use   Smoking status: Former    Types: Cigarettes    Quit date: 11/24/1978    Years since quitting: 42.6   Smokeless tobacco: Never  Substance and Sexual  Activity   Alcohol use: Yes    Comment: 1 to 2 beers a month   Drug use: No   Sexual activity: Not on file  Other Topics Concern   Not on file  Social History Narrative   Former minor league shortstop      Has living will   Wife is health care POA--children would be alternates   Would accept resuscitation   No tube feeds if cognitively unaware   Social Determinants of Health   Financial Resource Strain: Not on file  Food Insecurity: Not on file  Transportation Needs: Not on file  Physical Activity: Not on file  Stress: Not on file  Social Connections: Not on file   No Known Allergies Family History  Problem Relation Age of Onset   Colon cancer Neg Hx    Stomach cancer Neg Hx    Esophageal cancer Neg Hx    Pancreatic cancer Neg Hx    Rectal cancer Neg Hx      Current Outpatient Medications (Cardiovascular):    atorvastatin (LIPITOR) 80 MG tablet, Take 1 tablet (80 mg total) by mouth daily.   nitroGLYCERIN (NITROSTAT) 0.4 MG SL tablet, Place 1 tablet (0.4 mg total) under the tongue every 5 (five) minutes as needed for chest pain.   Current Outpatient Medications (  Analgesics):    aspirin EC 81 MG tablet, Take 81 mg by mouth daily.   Current Outpatient Medications (Other):    fluconazole (DIFLUCAN) 150 MG tablet, Take 1 tablet (150 mg total) by mouth once a week. As needed for recurrent rash   Reviewed prior external information including notes and imaging from  primary care provider As well as notes that were available from care everywhere and other healthcare systems.  Past medical history, social, surgical and family history all reviewed in electronic medical record.  No pertanent information unless stated regarding to the chief complaint.   Review of Systems:  No headache, visual changes, nausea, vomiting, diarrhea, constipation, dizziness, abdominal pain, skin rash, fevers, chills, night sweats, weight loss, swollen lymph nodes, body aches, joint swelling,  chest pain, shortness of breath, mood changes. POSITIVE muscle aches  Objective  Blood pressure 110/72, pulse 60, height 5' 9.5" (1.765 m), weight 219 lb (99.3 kg), SpO2 98 %.   General: No apparent distress alert and oriented x3 mood and affect normal, dressed appropriately.  HEENT: Pupils equal, extraocular movements intact  Respiratory: Patient's speak in full sentences and does not appear short of breath  Cardiovascular: No lower extremity edema, non tender, no erythema  Gait antalgic favoring the left leg. Patient's left knee has some fullness noted in the popliteal area.  This seems to be on the midline and then goes more laterally.  Patient does not though have any pain in the calf itself.  Patient does have pain with extension of the knee.  He does have full extension of the knee.  Lacks last 20 degrees of flexion. Right knee still has significant arthritic changes but very minimal tender today.  Still some instability noted on exam   Procedure: Real-time Ultrasound Guided Injection of left knee cyst Device: GE Logiq Q7 Ultrasound guided injection is preferred based studies that show increased duration, increased effect, greater accuracy, decreased procedural pain, increased response rate, and decreased cost with ultrasound guided versus blind injection.  Verbal informed consent obtained.  Time-out conducted.  Noted no overlying erythema, induration, or other signs of local infection.  Skin prepped in a sterile fashion.  Local anesthesia: Topical Ethyl chloride.  With sterile technique and under real time ultrasound guidance: With a 22-gauge 2 inch needle patient was injected with 4 cc of 0.5% Marcaine and aspirated 12 cc of straw light-colored fluid then injected 1 cc of Kenalog 40 mg/dL. This was from a posterior approach.  Completed without difficulty  Pain immediately resolved suggesting accurate placement of the medication.  Improvement in range of motion as well. Advised to  call if fevers/chills, erythema, induration, drainage, or persistent bleeding.  Images permanently stored and available for review in the ultrasound unit.  Impression: Technically successful ultrasound guided injection.   Impression and Recommendations:    The above documentation has been reviewed and is accurate and complete Lyndal Pulley, DO

## 2021-06-28 NOTE — Assessment & Plan Note (Signed)
Patient did have aspiration done today and tolerated the procedure well.  Discussed icing regimen and home exercises.  Discussed avoiding certain activities.  Follow-up again in 6 weeks

## 2021-06-29 LAB — SYNOVIAL FLUID ANALYSIS, COMPLETE
Basophils, %: 0 %
Eosinophils-Synovial: 0 % (ref 0–2)
Lymphocytes-Synovial Fld: 20 % (ref 0–74)
Monocyte/Macrophage: 70 % — ABNORMAL HIGH (ref 0–69)
Neutrophil, Synovial: 8 % (ref 0–24)
Synoviocytes, %: 2 % (ref 0–15)
WBC, Synovial: 337 cells/uL — ABNORMAL HIGH (ref <150)

## 2021-08-08 MED ORDER — ATORVASTATIN CALCIUM 80 MG PO TABS: 80.0000 mg | ORAL_TABLET | Freq: Every day | ORAL | 3 refills | Status: DC

## 2021-09-17 MED ORDER — FLUCONAZOLE 150 MG PO TABS: 150.0000 mg | ORAL_TABLET | ORAL | 5 refills | Status: DC

## 2021-10-02 NOTE — Progress Notes (Signed)
[place] Phone: [phone] Subjective:   James [name], am serving as a scribe for Dr. Hulan Saas. This visit occurred during the SARS-CoV-2 public health emergency.  Safety protocols were in place, including screening questions prior to the visit, additional usage of staff PPE, and extensive cleaning of exam room while observing appropriate contact time as indicated for disinfecting solutions.   I'm seeing this patient by the request  of:  Venia Carbon, MD  CC: Left knee pain  [id number]  06/28/2021 Patient did have aspiration done today and tolerated the procedure well.  Discussed icing regimen and home exercises.  Discussed avoiding certain activities.  Follow-up again in 6 weeks  Update 10/03/2021 James [name] is a 65 y.o. male coming in with complaint of L knee, Baker's cyst. Patient states that his pain has been increasing for past month. Swelling increased 3 weeks ago to the degree that he was unable to walk. Pain over medial aspect of joint that is constant.      Past Medical History:  Diagnosis Date   Adenomatous colon polyp 2010   CAD (coronary artery disease)    Cataract    Duodenal ulcer due to Helicobacter pylori 16/11/930   Gastric ulcer due to Helicobacter pylori 01/27/5731   Hx of adenomatous colonic polyps 01/26/2009   Hyperlipidemia    MI (myocardial infarction) ([place])    Tinea versicolor    Past Surgical History:  Procedure Laterality Date   COLONOSCOPY     COLONOSCOPY W/ POLYPECTOMY     CORONARY STENT PLACEMENT     at [place]   jaw tumor Left    POLYPECTOMY     TONSILLECTOMY  1962   Social History   Socioeconomic History   Marital status: Married    Spouse name: Not on file   Number of children: 2   Years of education: Not on file   Highest education level: Not on file  Occupational History   Occupation: Hotel manager    Employer: [employer]: Retired  Tobacco Use   Smoking status: Former    Types: Cigarettes    Quit date: 11/24/1978    Years since quitting: 42.8   Smokeless tobacco: Never  Substance and Sexual Activity   Alcohol use: Yes    Comment: 1 to 2 beers a month   Drug use: [name]   Sexual activity: Not on file  Other Topics Concern   Not on file  Social History Narrative   Former minor league shortstop      Has living will   Wife is health care POA--children would be alternates   Would accept resuscitation   [name] tube feeds if cognitively unaware   Social Determinants of Health   Financial Resource Strain: Not on file  Food Insecurity: Not on file  Transportation Needs: Not on file  Physical Activity: Not on file  Stress: Not on file  Social Connections: Not on file   [name] Known Allergies Family History  Problem Relation Age of Onset   Colon cancer Neg Hx    Stomach cancer Neg Hx    Esophageal cancer Neg Hx    Pancreatic cancer Neg Hx    Rectal cancer Neg Hx      Current Outpatient Medications (Cardiovascular):    atorvastatin (LIPITOR) 80 MG tablet, Take 1 tablet (80 mg total) by mouth daily.   nitroGLYCERIN (NITROSTAT) 0.4 MG SL tablet,  Place 1 tablet (0.4 mg total) under the tongue every 5 (five) minutes as needed for chest pain.   Current Outpatient Medications (Analgesics):    aspirin EC 81 MG tablet, Take 81 mg by mouth daily.   Current Outpatient Medications (Other):    fluconazole (DIFLUCAN) 150 MG tablet, Take 1 tablet (150 mg total) by mouth once a week. As needed for recurrent rash   Reviewed prior external information including notes and imaging from  primary care provider As well as notes that were available from care everywhere and other healthcare systems.  Past medical history, social, surgical and family history all reviewed in electronic medical record.  [name] pertanent information unless stated regarding to the chief complaint.   Review of Systems:  [name]  headache, visual changes, nausea, vomiting, diarrhea, constipation, dizziness, abdominal pain, skin rash, fevers, chills, night sweats, weight loss, swollen lymph nodes, body aches, joint swelling, chest pain, shortness of breath, mood changes. POSITIVE muscle aches  Objective  Blood pressure 118/76, pulse 64, height 5' 9.5" (1.765 m), weight 217 lb (98.4 kg), SpO2 99 %.   General: [name] apparent distress alert and oriented x3 mood and affect normal, dressed appropriately.  HEENT: Pupils equal, extraocular movements intact  Respiratory: Patient's speak in full sentences and does not appear short of breath  Cardiovascular: [name] lower extremity edema, non tender, [name] erythema  Gait severely antalgic gait Right knee exam does have some tenderness to palpation.  Does have some arthritic changes noted as well.  Instability with valgus and varus force. Left knee does have significant swelling and does have swelling in the calf.  Pain seems to be mostly in the popliteal area.  Baker's cyst is appreciated.  Limited muscular skeletal ultrasound was performed and interpreted by Hulan Saas, M  Limited ultrasound of patient's left popliteal area shows the patient does have a Baker's cyst and does appear to be draining down into the calf aspect.  Patient is tender.  Popliteal vessel that is noted is completely compressible. Impression: Ruptured Baker's cyst   Procedure: Real-time Ultrasound Guided Injection of left knee Device: GE Logiq Q7 Ultrasound guided injection is preferred based studies that show increased duration, increased effect, greater accuracy, decreased procedural pain, increased response rate, and decreased cost with ultrasound guided versus blind injection.  Verbal informed consent obtained.  Time-out conducted.  Noted [name] overlying erythema, induration, or other signs of local infection.  Skin prepped in a sterile fashion.  Local anesthesia: Topical Ethyl chloride.  With sterile technique  and under real time ultrasound guidance: With a 22-gauge 2 inch needle patient was injected with 4 cc of 0.5% Marcaine and aspirated 25 cc of straw light-colored fluid then injected 1 cc of Kenalog 40 mg/dL. This was from a posterior approach.  Completed without difficulty  Pain immediately resolved suggesting accurate placement of the medication.  Advised to call if fevers/chills, erythema, induration, drainage, or persistent bleeding.  Impression: Technically successful ultrasound guided injection.  After informed written and verbal consent, patient was seated on exam table. Right knee was prepped with alcohol swab and utilizing anterolateral approach, patient's right knee space was injected with 4:1  marcaine 0.5%: Kenalog 40mg /dL. Patient tolerated the procedure well without immediate complications.   Impression and Recommendations:    The above documentation has been reviewed and is accurate and complete James Pulley, DO

## 2021-10-03 ENCOUNTER — Ambulatory Visit: Payer: Self-pay

## 2021-10-03 ENCOUNTER — Encounter: Payer: Self-pay | Admitting: Family Medicine

## 2021-10-03 ENCOUNTER — Ambulatory Visit: Payer: Medicare PPO | Admitting: Family Medicine

## 2021-10-03 ENCOUNTER — Other Ambulatory Visit: Payer: Self-pay

## 2021-10-03 VITALS — BP 118/76 | HR 64 | Ht 69.5 in | Wt 217.0 lb

## 2021-10-03 DIAGNOSIS — M1711 Unilateral primary osteoarthritis, right knee: Secondary | ICD-10-CM

## 2021-10-03 DIAGNOSIS — G8929 Other chronic pain: Secondary | ICD-10-CM

## 2021-10-03 DIAGNOSIS — M25562 Pain in left knee: Secondary | ICD-10-CM | POA: Diagnosis not present

## 2021-10-03 DIAGNOSIS — M7122 Synovial cyst of popliteal space [Baker], left knee: Secondary | ICD-10-CM | POA: Diagnosis not present

## 2021-10-03 DIAGNOSIS — M170 Bilateral primary osteoarthritis of knee: Secondary | ICD-10-CM | POA: Diagnosis not present

## 2021-10-03 NOTE — Patient Instructions (Signed)
See me again in 6 weeks 

## 2021-10-03 NOTE — Assessment & Plan Note (Addendum)
Aspiration done again today of the left knee.  Patient did have a ruptured Baker's cyst that did not unfortunately contribute to more of the swelling in the leg noted.  We discussed with patient that there is also possibility of a deep venous thrombosis but very unlikely.  Patient states that he has not traveled recently, has been able to increase activity otherwise and it was a absolute amount of pain at 1 point.  Patient also denies any shortness of breath.  We did discuss with patient know if any shortness of breath or chest pain to seek medical attention immediately.  Patient was feeling better after the aspiration today already though.  Discussed with patient about the possibility of referral to orthopedic surgery as well which patient declined today.

## 2021-10-24 ENCOUNTER — Ambulatory Visit: Payer: Medicare PPO | Admitting: Family Medicine

## 2021-10-30 DIAGNOSIS — I2510 Atherosclerotic heart disease of native coronary artery without angina pectoris: Secondary | ICD-10-CM | POA: Diagnosis not present

## 2021-11-07 ENCOUNTER — Other Ambulatory Visit (HOSPITAL_COMMUNITY): Payer: Self-pay

## 2021-12-05 ENCOUNTER — Telehealth: Payer: Medicare PPO | Admitting: Nurse Practitioner

## 2021-12-05 DIAGNOSIS — H00014 Hordeolum externum left upper eyelid: Secondary | ICD-10-CM | POA: Diagnosis not present

## 2021-12-05 MED ORDER — BACITRACIN-POLYMYXIN B 500-10000 UNIT/GM OP OINT: 1.0000 "application " | TOPICAL_OINTMENT | Freq: Four times a day (QID) | OPHTHALMIC | 0 refills | Status: AC

## 2021-12-05 NOTE — Progress Notes (Signed)
°  E-Visit for Stye   We are sorry that you are not feeling well. Here is how we plan to help!  Based on what you have shared with me it looks like you have a stye.  A stye is an inflammation of the eyelid.  It is often a red, painful lump near the edge of the eyelid that may look like a boil or a pimple.  A stye develops when an infection occurs at the base of an eyelash.   We have made appropriate suggestions for you based upon your presentation: I have sent in bacitracin-polymyxin  opthalmic ointment to your pharmacy, apply to upper eyelash area of affected eye 4 times daily until symptoms resolve. It will be more effective if you apply a warm compress (warm washcloth) to eye prior to applying ointment. This ointment is safe to get into your eye. If your symptoms do not improve over the next two to three days you should be seen in your doctor's office.  HOME CARE:  Wash your hands often! Let the stye open on its own. Don't squeeze or open it. Don't rub your eyes. This can irritate your eyes and let in bacteria.  If you need to touch your eyes, wash your hands first. Don't wear eye makeup or contact lenses until the area has healed.  GET HELP RIGHT AWAY IF:  Your symptoms do not improve. You develop blurred or loss of vision. Your symptoms worsen (increased discharge, pain or redness).   Thank you for choosing an e-visit.  Your e-visit answers were reviewed by a board certified advanced clinical practitioner to complete your personal care plan. Depending upon the condition, your plan could have included both over the counter or prescription medications.  Please review your pharmacy choice. Make sure the pharmacy is open so you can pick up prescription now. If there is a problem, you may contact your provider through [employer] and have the prescription routed to another pharmacy.  Your safety is important to [place]. If you have drug allergies check your prescription carefully.   For  the next 24 hours you can use MyChart to ask questions about today's visit, request a non-urgent call back, or ask for a work or school excuse. You will get an email in the next two days asking about your experience. I hope that your e-visit has been valuable and will speed your recovery.   Meds ordered this encounter  Medications   bacitracin-polymyxin b (POLYSPORIN) ophthalmic ointment    Sig: Place 1 application into the left eye 4 (four) times daily for 5 days. Apply to upper lid along eyelash area    Dispense:  3.5 g    Refill:  0     I spent approximately 7 minutes reviewing the patient's history, current symptoms and coordinating their plan of care today.

## 2022-05-22 ENCOUNTER — Ambulatory Visit: Payer: Medicare PPO

## 2022-05-22 NOTE — Progress Notes (Signed)
   Covid-19 Vaccination Clinic  Name:  Daryl Beehler    MRN: [id number] DOB: 1956/07/31  05/22/2022  Mr. Skoda was observed post Covid-19 immunization for 15 minutes without incident. He was provided with Vaccine Information Sheet and instruction to access the V-Safe system.   Mr. Bohlman was instructed to call 911 with any severe reactions post vaccine: Difficulty breathing  Swelling of face and throat  A fast heartbeat  A bad rash all over body  Dizziness and weakness

## 2022-08-25 ENCOUNTER — Encounter: Payer: Self-pay | Admitting: Internal Medicine

## 2022-08-25 ENCOUNTER — Ambulatory Visit (INDEPENDENT_AMBULATORY_CARE_PROVIDER_SITE_OTHER): Payer: Medicare PPO | Admitting: Internal Medicine

## 2022-08-25 VITALS — BP 104/74 | HR 62 | Temp 97.8°F | Ht 69.5 in | Wt 217.0 lb

## 2022-08-25 DIAGNOSIS — Z0000 Encounter for general adult medical examination without abnormal findings: Secondary | ICD-10-CM | POA: Diagnosis not present

## 2022-08-25 DIAGNOSIS — M170 Bilateral primary osteoarthritis of knee: Secondary | ICD-10-CM

## 2022-08-25 DIAGNOSIS — Z125 Encounter for screening for malignant neoplasm of prostate: Secondary | ICD-10-CM | POA: Diagnosis not present

## 2022-08-25 DIAGNOSIS — B360 Pityriasis versicolor: Secondary | ICD-10-CM | POA: Diagnosis not present

## 2022-08-25 DIAGNOSIS — I2510 Atherosclerotic heart disease of native coronary artery without angina pectoris: Secondary | ICD-10-CM | POA: Diagnosis not present

## 2022-08-25 LAB — COMPREHENSIVE METABOLIC PANEL
ALT: 21 U/L (ref 0–53)
AST: 22 U/L (ref 0–37)
Albumin: 4.2 g/dL (ref 3.5–5.2)
Alkaline Phosphatase: 93 U/L (ref 39–117)
BUN: 13 mg/dL (ref 6–23)
CO2: 24 mEq/L (ref 19–32)
Calcium: 9.7 mg/dL (ref 8.4–10.5)
Chloride: 109 mEq/L (ref 96–112)
Creatinine, Ser: 0.94 mg/dL (ref 0.40–1.50)
GFR: 83.43 mL/min (ref >60.00)
Glucose, Bld: 85 mg/dL (ref 70–99)
Potassium: 4.2 mEq/L (ref 3.5–5.1)
Sodium: 141 mEq/L (ref 135–145)
Total Bilirubin: 0.7 mg/dL (ref 0.2–1.2)
Total Protein: 6.6 g/dL (ref 6.0–8.3)

## 2022-08-25 LAB — CBC
HCT: 42.9 % (ref 39.0–52.0)
Hemoglobin: 13.9 g/dL (ref 13.0–17.0)
MCHC: 32.5 g/dL (ref 30.0–36.0)
MCV: 89.8 fl (ref 78.0–100.0)
Platelets: 205 10*3/uL (ref 150.0–400.0)
RBC: 4.78 Mil/uL (ref 4.22–5.81)
RDW: 12.9 % (ref 11.5–15.5)
WBC: 4.6 10*3/uL (ref 4.0–10.5)

## 2022-08-25 LAB — LIPID PANEL
Cholesterol: 113 mg/dL (ref 0–200)
HDL: 34.1 mg/dL — ABNORMAL LOW (ref >39.00)
LDL Cholesterol: 44 mg/dL (ref 0–99)
NonHDL: 79.09
Total CHOL/HDL Ratio: 3
Triglycerides: 175 mg/dL — ABNORMAL HIGH (ref 0.0–149.0)
VLDL: 35 mg/dL (ref 0.0–40.0)

## 2022-08-25 LAB — PSA, MEDICARE: PSA: 1.07 ng/ml (ref 0.10–4.00)

## 2022-08-25 MED ORDER — TRIAMCINOLONE ACETONIDE 0.1 % EX CREA: 1.0000 | TOPICAL_CREAM | Freq: Two times a day (BID) | CUTANEOUS | 1 refills | Status: AC | PRN

## 2022-08-25 MED ORDER — SELENIUM SULFIDE 2.5 % EX LOTN: 1.0000 | TOPICAL_LOTION | CUTANEOUS | 12 refills | Status: DC

## 2022-08-25 NOTE — Assessment & Plan Note (Signed)
Working with sports medicine Topical Rx for now

## 2022-08-25 NOTE — Assessment & Plan Note (Signed)
Will Rx the selenum topical again TAC for itching

## 2022-08-25 NOTE — Assessment & Plan Note (Signed)
Past MI No symptoms on ASA '81mg'$  and atorvastatin 80 daily

## 2022-08-25 NOTE — Assessment & Plan Note (Signed)
I have personally reviewed the Medicare Annual Wellness questionnaire and have noted 1. The patient's medical and social history 2. Their use of alcohol, tobacco or illicit drugs 3. Their current medications and supplements 4. The patient's functional ability including ADL's, fall risks, home safety risks and hearing or visual             impairment. 5. Diet and physical activities 6. Evidence for depression or mood disorders  The patients weight, height, BMI and visual acuity have been recorded in the chart I have made referrals, counseling and provided education to the patient based review of the above and I have provided the pt with a written personalized care plan for preventive services.  I have provided you with a copy of your personalized plan for preventive services. Please take the time to review along with your updated medication list.  Prefers no flu/pneumonia vaccines Recommended new COVID vaccine Is exercising Colon due again next year Will check PSA

## 2022-08-25 NOTE — Progress Notes (Signed)
Vision Screening   Right eye Left eye Both eyes  Without correction 20/20 20/20 20/20  With correction     Hearing Screening - Comments:: Passed whisper test  

## 2022-08-25 NOTE — Progress Notes (Signed)
Subjective:    Patient ID: James [name], male    DOB: 02-15-1956, 66 y.o.   MRN: [id number]  HPI Here for Medicare wellness visit and follow up of chronic health conditions Reviewed advanced directives Reviewed other doctors---Dr Nouriddene--cardiology, Dr Duard Larsen medicine, dentist? No hospitalizations or surgery this year Vision is okay---various eye doctors Hearing is fair--some trouble with competing sounds Exercising more regularly  Occasional alcohol No tobacco now No falls No depression or anhedonia Independent with instrumental ADLs No memory problems  No heart trouble Weight up and down No chest pain or SOB No dizziness or syncope No palpitations No edema  Ongoing knee issues Uses topical meds--nothing oral  Rash comes back Fluconazole does help Hasn't used selsun in a long while  Current Outpatient Medications on File Prior to Visit  Medication Sig Dispense Refill   aspirin EC 81 MG tablet Take 81 mg by mouth daily.     atorvastatin (LIPITOR) 80 MG tablet Take 1 tablet (80 mg total) by mouth daily. 90 tablet 3   fluconazole (DIFLUCAN) 150 MG tablet Take 1 tablet (150 mg total) by mouth once a week. As needed for recurrent rash 4 tablet 5   nitroGLYCERIN (NITROSTAT) 0.4 MG SL tablet Place 1 tablet (0.4 mg total) under the tongue every 5 (five) minutes as needed for chest pain. 25 tablet 1   No current facility-administered medications on file prior to visit.    No Known Allergies  Past Medical History:  Diagnosis Date   Adenomatous colon polyp 2010   CAD (coronary artery disease)    Cataract    Duodenal ulcer due to Helicobacter pylori 08/65/7846   Gastric ulcer due to Helicobacter pylori 07/31/2951   Hx of adenomatous colonic polyps 01/26/2009   Hyperlipidemia    MI (myocardial infarction) ([place])    Tinea versicolor     Past Surgical History:  Procedure Laterality Date   COLONOSCOPY     COLONOSCOPY W/ POLYPECTOMY     CORONARY STENT  PLACEMENT     at [place]   jaw tumor Left    POLYPECTOMY     TONSILLECTOMY  1962    Family History  Problem Relation Age of Onset   Colon cancer Neg Hx    Stomach cancer Neg Hx    Esophageal cancer Neg Hx    Pancreatic cancer Neg Hx    Rectal cancer Neg Hx     Social History   Socioeconomic History   Marital status: Married    Spouse name: Not on file   Number of children: 2   Years of education: Not on file   Highest education level: Not on file  Occupational History   Occupation: Hotel manager    Employer: [employer]    Comment: Retired  Tobacco Use   Smoking status: Former    Types: Cigarettes    Quit date: 11/24/1978    Years since quitting: 43.7    Passive exposure: Past   Smokeless tobacco: Never  Substance and Sexual Activity   Alcohol use: Yes    Comment: 1 to 2 beers a month   Drug use: No   Sexual activity: Not on file  Other Topics Concern   Not on file  Social History Narrative   Former minor league shortstop      Has living will   Wife is health care POA--children would be alternates   Would accept resuscitation   No tube feeds if cognitively unaware   Social Determinants  of Health   Financial Resource Strain: Not on file  Food Insecurity: Not on file  Transportation Needs: Not on file  Physical Activity: Not on file  Stress: Not on file  Social Connections: Not on file  Intimate Partner Violence: Not on file   Review of Systems Appetite is good Weight fairly stable Sleeps well Wears seat belt Teeth okay--sees dentist No heartburn or dysphagia Bowels move fine--no blood Voids with good stream. Nocturia x 2 No suspicious skin lesions No other back or joint issues--just the knees    Objective:   Physical Exam Constitutional:      Appearance: Normal appearance.  HENT:     Mouth/Throat:     Comments: No lesions Eyes:     Conjunctiva/sclera: Conjunctivae normal.     Pupils: Pupils are equal, round, and  reactive to light.  Cardiovascular:     Rate and Rhythm: Normal rate and regular rhythm.     Pulses: Normal pulses.     Heart sounds: No murmur heard.    No gallop.  Pulmonary:     Effort: Pulmonary effort is normal.     Breath sounds: Normal breath sounds. No wheezing or rales.  Abdominal:     Palpations: Abdomen is soft.     Tenderness: There is no abdominal tenderness.  Musculoskeletal:     Cervical back: Neck supple.     Right lower leg: No edema.     Left lower leg: No edema.  Lymphadenopathy:     Cervical: No cervical adenopathy.  Skin:    Findings: No lesion.     Comments: Rash prominent on extensor right arm/elbow---with excoriations from itching  Neurological:     General: No focal deficit present.     Mental Status: He is alert and oriented to person, place, and time.     Comments: Mini-cog okay--clock fine, recall 2/3  Psychiatric:        Mood and Affect: Mood normal.        Behavior: Behavior normal.            Assessment & Plan:

## 2022-10-08 NOTE — Progress Notes (Signed)
[name] [place] Phone: [phone] Subjective:   James [name], am serving as a scribe for Dr. Hulan Saas.  I'm seeing this patient by the request  of:  Venia Carbon, MD  CC: Left knee pain  [id number]  10/03/2021    Aspiration done again today of the left knee.  Patient did have a ruptured Baker's cyst that did not unfortunately contribute to more of the swelling in the leg noted.  We discussed with patient that there is also possibility of a deep venous thrombosis but very unlikely.  Patient states that he has not traveled recently, has been able to increase activity otherwise and it was a absolute amount of pain at 1 point.  Patient also denies any shortness of breath.  We did discuss with patient know if any shortness of breath or chest pain to seek medical attention immediately.  Patient was feeling better after the aspiration today already though.  Discussed with patient about the possibility of referral to orthopedic surgery as well which patient declined today.      Update 10/09/2022 James [name] is a 66 y.o. male coming in with complaint of L knee pain.  Patient has had arthritic changes as well as Baker's cyst.  Patient has had multiple aspirations and last one done approximately 1 year ago.  Patient states that the knee is swelling again with some pain. Right shoulder has been hurting for about a month and a half ago. Patient locates pain to top of his shoulder states it feels deep. He feels some pulling when looking up or raising his right arm. No MOI.       Past Medical History:  Diagnosis Date   Adenomatous colon polyp 2010   CAD (coronary artery disease)    Cataract    Duodenal ulcer due to Helicobacter pylori 88/91/6945   Gastric ulcer due to Helicobacter pylori 0/01/8881   Hx of adenomatous colonic polyps 01/26/2009   Hyperlipidemia    MI (myocardial infarction) ([place])    Tinea  versicolor    Past Surgical History:  Procedure Laterality Date   COLONOSCOPY     COLONOSCOPY W/ POLYPECTOMY     CORONARY STENT PLACEMENT     at [place]   jaw tumor Left    POLYPECTOMY     TONSILLECTOMY  1962   Social History   Socioeconomic History   Marital status: Married    Spouse name: Not on file   Number of children: 2   Years of education: Not on file   Highest education level: Not on file  Occupational History   Occupation: Hotel manager    Employer: [employer]: Retired  Tobacco Use   Smoking status: Former    Types: Cigarettes    Quit date: 11/24/1978    Years since quitting: 43.9    Passive exposure: Past   Smokeless tobacco: Never  Substance and Sexual Activity   Alcohol use: Yes    Comment: 1 to 2 beers a month   Drug use: No   Sexual activity: Not on file  Other Topics Concern   Not on file  Social History Narrative   Former minor league shortstop      Has living will   Wife is health care POA--children would be alternates   Would accept resuscitation   No tube feeds if cognitively unaware   Social Determinants of Radio broadcast assistant  Strain: Not on file  Food Insecurity: Not on file  Transportation Needs: Not on file  Physical Activity: Not on file  Stress: Not on file  Social Connections: Not on file   No Known Allergies Family History  Problem Relation Age of Onset   Colon cancer Neg Hx    Stomach cancer Neg Hx    Esophageal cancer Neg Hx    Pancreatic cancer Neg Hx    Rectal cancer Neg Hx      Current Outpatient Medications (Cardiovascular):    atorvastatin (LIPITOR) 80 MG tablet, Take 1 tablet (80 mg total) by mouth daily.   nitroGLYCERIN (NITROSTAT) 0.4 MG SL tablet, Place 1 tablet (0.4 mg total) under the tongue every 5 (five) minutes as needed for chest pain.   Current Outpatient Medications (Analgesics):    aspirin EC 81 MG tablet, Take 81 mg by mouth daily.   Current  Outpatient Medications (Other):    fluconazole (DIFLUCAN) 150 MG tablet, Take 1 tablet (150 mg total) by mouth once a week. As needed for recurrent rash   selenium sulfide (SELSUN) 2.5 % shampoo, Apply 1 Application topically once a week.   triamcinolone cream (KENALOG) 0.1 %, Apply 1 Application topically 2 (two) times daily as needed.   Reviewed prior external information including notes and imaging from  primary care provider As well as notes that were available from care everywhere and other healthcare systems.  Past medical history, social, surgical and family history all reviewed in electronic medical record.  No pertanent information unless stated regarding to the chief complaint.   Review of Systems:  No headache, visual changes, nausea, vomiting, diarrhea, constipation, dizziness, abdominal pain, skin rash, fevers, chills, night sweats, weight loss, swollen lymph nodes, body aches, joint swelling, chest pain, shortness of breath, mood changes. POSITIVE muscle aches  Objective  Blood pressure 120/82, pulse 73, height 5' 9.5" (1.765 m), weight 215 lb (97.5 kg), SpO2 98 %.   General: No apparent distress alert and oriented x3 mood and affect normal, dressed appropriately.  HEENT: Pupils equal, extraocular movements intact  Respiratory: Patient's speak in full sentences and does not appear short of breath  Cardiovascular: No lower extremity edema, non tender, no erythema  Knee exam shows patient does have some instability in the knees bilaterally.  Patient does have significant effusion noted.  Patient does have abnormality and instability noted with valgus and varus force.  Right shoulder exam shows the patient does have positive impingement noted with Neer and Hawkins.  Rotator cuff strength does appear to be intact.  Positive crossover.  Limited muscular skeletal ultrasound was performed and interpreted by Hulan Saas, M  .  Ultrasound of patient's right shoulder shows that  patient does have hypoechoic changes of the supraspinatus that is consistent with a potential bursitis in the subacromial area.  Patient also has moderate arthritic changes noted of the acromioclavicular joint with hypoechoic changes significant for an effusion. Impression: Acromioclavicular arthritis with subacromial bursitis  After informed written and verbal consent, patient was seated on exam table. Right knee was prepped with alcohol swab and utilizing anterolateral approach, patient's right knee space was injected with 4:1  marcaine 0.5%: Kenalog '40mg'$ /dL. Patient tolerated the procedure well without immediate complications.  After informed written and verbal consent, patient was seated on exam table. Left knee was prepped with alcohol swab and utilizing anterolateral approach, patient's left knee space was injected with 4:1  marcaine 0.5%: Kenalog '40mg'$ /dL.  Patient then had aspiration 20 cc of milky  straw  like colored fluid patient tolerated the procedure well without immediate complications.  Procedure: Real-time Ultrasound Guided Injection of right acromioclavicular joint Device: GE Logiq Q7 Ultrasound guided injection is preferred based studies that show increased duration, increased effect, greater accuracy, decreased procedural pain, increased response rate, and decreased cost with ultrasound guided versus blind injection.  Verbal informed consent obtained.  Time-out conducted.  Noted no overlying erythema, induration, or other signs of local infection.  Skin prepped in a sterile fashion.  Local anesthesia: Topical Ethyl chloride.  With sterile technique and under real time ultrasound guidance: With a 25-gauge half inch needle injecting 0.5 cc of 0.5% Marcaine and 0.5 cc of Kenalog 40 mg/mL Completed without difficulty  Pain immediately resolved suggesting accurate placement of the medication.  Advised to call if fevers/chills, erythema, induration, drainage, or persistent bleeding.   Impression: Technically successful ultrasound guided injection.    Impression and Recommendations:     The above documentation has been reviewed and is accurate and complete Lyndal Pulley, DO

## 2022-10-09 ENCOUNTER — Ambulatory Visit: Payer: Self-pay

## 2022-10-09 ENCOUNTER — Ambulatory Visit: Payer: Medicare PPO | Admitting: Family Medicine

## 2022-10-09 ENCOUNTER — Ambulatory Visit (INDEPENDENT_AMBULATORY_CARE_PROVIDER_SITE_OTHER): Payer: Medicare PPO

## 2022-10-09 VITALS — BP 120/82 | HR 73 | Ht 69.5 in | Wt 215.0 lb

## 2022-10-09 DIAGNOSIS — M542 Cervicalgia: Secondary | ICD-10-CM | POA: Diagnosis not present

## 2022-10-09 DIAGNOSIS — M19011 Primary osteoarthritis, right shoulder: Secondary | ICD-10-CM

## 2022-10-09 DIAGNOSIS — M19019 Primary osteoarthritis, unspecified shoulder: Secondary | ICD-10-CM | POA: Insufficient documentation

## 2022-10-09 DIAGNOSIS — M170 Bilateral primary osteoarthritis of knee: Secondary | ICD-10-CM | POA: Diagnosis not present

## 2022-10-09 DIAGNOSIS — M25511 Pain in right shoulder: Secondary | ICD-10-CM

## 2022-10-09 DIAGNOSIS — M25562 Pain in left knee: Secondary | ICD-10-CM

## 2022-10-09 DIAGNOSIS — G8929 Other chronic pain: Secondary | ICD-10-CM | POA: Diagnosis not present

## 2022-10-09 NOTE — Assessment & Plan Note (Signed)
Bilateral injections given today.  Tolerated the procedure well, patient has been dealing with this for greater than 2 years with known severe arthritis.  Patient did have some aspiration of the left knee and will send it to the lab for further evaluation.  Questionable gout.  Chronic problem with worsening symptoms.  We will see if we can get approval for viscosupplementation.  Follow-up again in 8 weeks for further evaluation and treatment.

## 2022-10-09 NOTE — Patient Instructions (Signed)
See me again in 6-8 weeks ?

## 2022-10-09 NOTE — Assessment & Plan Note (Signed)
Patient given injection today and tolerated the procedure well, discussed home exercises and icing regimen, we will get x-rays of the shoulder and the neck today to further evaluate for any underlying bony abnormality that could be contributing.  Discussed icing regimen.  Follow-up again in 6 to 8 weeks.

## 2022-10-10 LAB — SYNOVIAL FLUID ANALYSIS, COMPLETE
Basophils, %: 0 %
Eosinophils-Synovial: 0 % (ref 0–2)
Lymphocytes-Synovial Fld: 67 % (ref 0–74)
Monocyte/Macrophage: 24 % (ref 0–69)
Neutrophil, Synovial: 9 % (ref 0–24)
Synoviocytes, %: 0 % (ref 0–15)
WBC, Synovial: 328 cells/uL — ABNORMAL HIGH (ref <150)

## 2022-11-05 DIAGNOSIS — I2510 Atherosclerotic heart disease of native coronary artery without angina pectoris: Secondary | ICD-10-CM | POA: Diagnosis not present

## 2022-11-25 NOTE — Progress Notes (Signed)
James [name] [place] Phone: [phone] Subjective:    I'm seeing this patient by the request  of:  Venia Carbon, MD  CC: bilateral knee right shoulder   [id number]  10/09/2022 Patient given injection today and tolerated the procedure well, discussed home exercises and icing regimen, we will get x-rays of the shoulder and the neck today to further evaluate for any underlying bony abnormality that could be contributing.  Discussed icing regimen.  Follow-up again in 6 to 8 weeks.      Bilateral injections given today.  Tolerated the procedure well, patient has been dealing with this for greater than 2 years with known severe arthritis.  Patient did have some aspiration of the left knee and will send it to the lab for further evaluation.  Questionable gout.  Chronic problem with worsening symptoms.  We will see if we can get approval for viscosupplementation.  Follow-up again in 8 weeks for further evaluation and treatment.      Update 11/27/2022 James [name] is a 67 y.o. male coming in with complaint of B knee and R shoulder pain.  Last visit patient was given injection the acromioclavicular joint of the right shoulder as well as bilateral knee injections patient states right shoulder is not doing well states that the injection did not help with the shoulder at all. States the knees tho the injection did help.        Past Medical History:  Diagnosis Date   Adenomatous colon polyp 2010   CAD (coronary artery disease)    Cataract    Duodenal ulcer due to Helicobacter pylori 43/32/9518   Gastric ulcer due to Helicobacter pylori 06/28/1659   Hx of adenomatous colonic polyps 01/26/2009   Hyperlipidemia    MI (myocardial infarction) ([place])    Tinea versicolor    Past Surgical History:  Procedure Laterality Date   COLONOSCOPY     COLONOSCOPY W/ POLYPECTOMY     CORONARY STENT PLACEMENT     at [place]   jaw tumor  Left    POLYPECTOMY     TONSILLECTOMY  1962   Social History   Socioeconomic History   Marital status: Married    Spouse name: Not on file   Number of children: 2   Years of education: Not on file   Highest education level: Not on file  Occupational History   Occupation: Hotel manager    Employer: [employer]: Retired  Tobacco Use   Smoking status: Former    Types: Cigarettes    Quit date: 11/24/1978    Years since quitting: 44.0    Passive exposure: Past   Smokeless tobacco: Never  Substance and Sexual Activity   Alcohol use: Yes    Comment: 1 to 2 beers a month   Drug use: No   Sexual activity: Not on file  Other Topics Concern   Not on file  Social History Narrative   Former minor league shortstop      Has living will   Wife is health care POA--children would be alternates   Would accept resuscitation   No tube feeds if cognitively unaware   Social Determinants of Health   Financial Resource Strain: Not on file  Food Insecurity: Not on file  Transportation Needs: Not on file  Physical Activity: Not on file  Stress: Not on file  Social Connections: Not on file   No Known Allergies Family  History  Problem Relation Age of Onset   Colon cancer Neg Hx    Stomach cancer Neg Hx    Esophageal cancer Neg Hx    Pancreatic cancer Neg Hx    Rectal cancer Neg Hx      Current Outpatient Medications (Cardiovascular):    atorvastatin (LIPITOR) 80 MG tablet, Take 1 tablet (80 mg total) by mouth daily.   nitroGLYCERIN (NITROSTAT) 0.4 MG SL tablet, Place 1 tablet (0.4 mg total) under the tongue every 5 (five) minutes as needed for chest pain.   Current Outpatient Medications (Analgesics):    aspirin EC 81 MG tablet, Take 81 mg by mouth daily.   Current Outpatient Medications (Other):    fluconazole (DIFLUCAN) 150 MG tablet, Take 1 tablet (150 mg total) by mouth once a week. As needed for recurrent rash   selenium sulfide (SELSUN) 2.5 %  shampoo, Apply 1 Application topically once a week.   triamcinolone cream (KENALOG) 0.1 %, Apply 1 Application topically 2 (two) times daily as needed.   Reviewed prior external information including notes and imaging from  primary care provider As well as notes that were available from care everywhere and other healthcare systems.  Past medical history, social, surgical and family history all reviewed in electronic medical record.  No pertanent information unless stated regarding to the chief complaint.   Review of Systems:  No headache, visual changes, nausea, vomiting, diarrhea, constipation, dizziness, abdominal pain, skin rash, fevers, chills, night sweats, weight loss, swollen lymph nodes, body aches, joint swelling, chest pain, shortness of breath, mood changes. POSITIVE muscle aches  Objective  Blood pressure 122/82, pulse 60, height 5' 9.5" (1.765 m), weight 215 lb (97.5 kg), SpO2 97 %.   General: No apparent distress alert and oriented x3 mood and affect normal, dressed appropriately.  HEENT: Pupils equal, extraocular movements intact  Respiratory: Patient's speak in full sentences and does not appear short of breath  Cardiovascular: No lower extremity edema, non tender, no erythema  Knee exams do have arthritic changes but no significant swelling noted today.  Right shoulder exam still has positive impingement noted.  Procedure: Real-time Ultrasound Guided Injection of right glenohumeral joint Device: GE Logiq Q7  Ultrasound guided injection is preferred based studies that show increased duration, increased effect, greater accuracy, decreased procedural pain, increased response rate with ultrasound guided versus blind injection.  Verbal informed consent obtained.  Time-out conducted.  Noted no overlying erythema, induration, or other signs of local infection.  Skin prepped in a sterile fashion.  Local anesthesia: Topical Ethyl chloride.  With sterile technique and under real  time ultrasound guidance:  Joint visualized.  23g 1  inch needle inserted posterior approach. Pictures taken for needle placement. Patient did have injection of  2 cc of 0.5% Marcaine, and 1.0 cc of Kenalog 40 mg/dL. Completed without difficulty  Pain immediately improved  suggesting accurate placement of the medication.  Advised to call if fevers/chills, erythema, induration, drainage, or persistent bleeding.  Impression: Technically successful ultrasound guided injection.    Impression and Recommendations:    The above documentation has been reviewed and is accurate and complete Lyndal Pulley, DO

## 2022-11-27 ENCOUNTER — Ambulatory Visit: Payer: Medicare PPO | Admitting: Family Medicine

## 2022-11-27 ENCOUNTER — Ambulatory Visit: Payer: Self-pay

## 2022-11-27 VITALS — BP 122/82 | HR 60 | Ht 69.5 in | Wt 215.0 lb

## 2022-11-27 DIAGNOSIS — M25511 Pain in right shoulder: Secondary | ICD-10-CM | POA: Diagnosis not present

## 2022-11-27 NOTE — Patient Instructions (Addendum)
Good to see you  Injection in shoulder today  Follow up in 6-8 weeks

## 2022-11-28 ENCOUNTER — Encounter: Payer: Self-pay | Admitting: Family Medicine

## 2022-11-28 DIAGNOSIS — M25511 Pain in right shoulder: Secondary | ICD-10-CM | POA: Insufficient documentation

## 2022-11-28 NOTE — Assessment & Plan Note (Signed)
Patient given injection and tolerated the procedure well, discussed icing regimen and home exercises, which activities to do and which ones to avoid.  Increase activity slowly over the course the next several weeks.  Follow-up again in 6 to 8 weeks

## 2022-12-12 ENCOUNTER — Encounter: Payer: Self-pay | Admitting: Internal Medicine

## 2022-12-15 MED ORDER — ATORVASTATIN CALCIUM 80 MG PO TABS: 80.0000 mg | ORAL_TABLET | Freq: Every day | ORAL | 3 refills | Status: AC

## 2022-12-23 ENCOUNTER — Encounter: Payer: Self-pay | Admitting: Family Medicine

## 2022-12-24 ENCOUNTER — Other Ambulatory Visit: Payer: Self-pay

## 2022-12-24 DIAGNOSIS — M19011 Primary osteoarthritis, right shoulder: Secondary | ICD-10-CM

## 2022-12-29 DIAGNOSIS — M7551 Bursitis of right shoulder: Secondary | ICD-10-CM | POA: Diagnosis not present

## 2022-12-29 DIAGNOSIS — M67911 Unspecified disorder of synovium and tendon, right shoulder: Secondary | ICD-10-CM | POA: Diagnosis not present

## 2022-12-29 DIAGNOSIS — M75111 Incomplete rotator cuff tear or rupture of right shoulder, not specified as traumatic: Secondary | ICD-10-CM | POA: Diagnosis not present

## 2022-12-29 DIAGNOSIS — M19011 Primary osteoarthritis, right shoulder: Secondary | ICD-10-CM | POA: Diagnosis not present

## 2022-12-31 DIAGNOSIS — H52223 Regular astigmatism, bilateral: Secondary | ICD-10-CM | POA: Diagnosis not present

## 2022-12-31 DIAGNOSIS — H524 Presbyopia: Secondary | ICD-10-CM | POA: Diagnosis not present

## 2023-01-03 ENCOUNTER — Encounter: Payer: Self-pay | Admitting: Family Medicine

## 2023-01-06 ENCOUNTER — Ambulatory Visit (INDEPENDENT_AMBULATORY_CARE_PROVIDER_SITE_OTHER): Payer: Medicare HMO

## 2023-01-06 ENCOUNTER — Ambulatory Visit: Payer: Medicare HMO | Admitting: Family Medicine

## 2023-01-06 ENCOUNTER — Encounter: Payer: Self-pay | Admitting: Family Medicine

## 2023-01-06 ENCOUNTER — Encounter: Payer: Self-pay | Admitting: Internal Medicine

## 2023-01-06 ENCOUNTER — Ambulatory Visit (INDEPENDENT_AMBULATORY_CARE_PROVIDER_SITE_OTHER): Payer: Medicare HMO | Admitting: Internal Medicine

## 2023-01-06 ENCOUNTER — Ambulatory Visit: Payer: Self-pay

## 2023-01-06 VITALS — BP 130/82 | HR 68 | Ht 69.5 in | Wt 212.0 lb

## 2023-01-06 VITALS — BP 130/82 | HR 68 | Temp 97.9°F | Ht 69.5 in | Wt 212.0 lb

## 2023-01-06 DIAGNOSIS — G8929 Other chronic pain: Secondary | ICD-10-CM | POA: Diagnosis not present

## 2023-01-06 DIAGNOSIS — M7061 Trochanteric bursitis, right hip: Secondary | ICD-10-CM | POA: Diagnosis not present

## 2023-01-06 DIAGNOSIS — M25551 Pain in right hip: Secondary | ICD-10-CM

## 2023-01-06 MED ORDER — FLUCONAZOLE 150 MG PO TABS: 150.0000 mg | ORAL_TABLET | ORAL | 5 refills | Status: DC

## 2023-01-06 NOTE — Progress Notes (Signed)
I, Josepha Pigg, CMA acting as a scribe for Lynne Leader, MD.  James [name] is a 67 y.o. male who presents to [place] at [facility] today for R hip pain. Pt was previously seen by Dr. Tamala Julian on 11/27/22 for R shoulder pain.   Today, pt c/o R hip pain x 1 week, MOI unknown, did golf the day prior.   Pt locates pain to posterior hip, radiating into the gluteal region and thigh. Denies groin pain. Some weakness in RIGHT leg causing stumbles. Abnormal gait. Sx causing night disturbance. Denies mechanical sx.   Low back pain: Radiates: right-sided lower back and R LE.  LE Numbness/tingling: R LE x 1 day LE Weakness: R LE Aggravates: sx are constant, worse with ambulation, sitting, standing, laying in bed. Treatments tried: IBU and/or Tylenol, heat/cold - no long term relief.   Pertinent review of systems: No fevers or chills  Relevant historical information: Coronary artery disease   Exam:  BP 130/82   Pulse 68   Ht 5' 9.5" (1.765 m)   Wt 212 lb (96.2 kg)   SpO2 99%   BMI 30.86 kg/m  General: Well Developed, well nourished, and in no acute distress.   MSK: Right hip: Normal. Tender palpation at lateral hip at iliac crest posterior lateral hip. Minimally tender greater trochanter. Decreased hip range of motion pain with flexion and rotation. Strength is reduced abduction with pain reproduced to iliac crest.     Lab and Radiology Results  Procedure: Real-time Ultrasound Guided Injection of right lateral hip iliac crest area of maximum tenderness at muscle origin and gluteus medius muscle Device: Philips Affiniti 50G Images permanently stored and available for review in PACS Verbal informed consent obtained.  Discussed risks and benefits of procedure. Warned about infection, bleeding, hyperglycemia damage to structures among others. Patient expresses understanding and agreement Time-out conducted.   Noted no overlying erythema, induration, or other  signs of local infection.   Skin prepped in a sterile fashion.   Local anesthesia: Topical Ethyl chloride.   With sterile technique and under real time ultrasound guidance: 40 mg of Kenalog and 2 mg Marcaine injected into gluteus medius muscle origin and posterior lateral iliac crest lateral hip. Fluid seen entering the iliac crest.   Completed without difficulty   Pain moderately resolved suggesting accurate placement of the medication.   Advised to call if fevers/chills, erythema, induration, drainage, or persistent bleeding.   Images permanently stored and available for review in the ultrasound unit.  Impression: Technically successful ultrasound guided injection.   X-ray images right hip obtained today personally and independently interpreted No acute fractures.  Mild right hip DJD. Await formal radiology review      Assessment and Plan: 67 y.o. male with multifactorial hip pain and leg pain.  Patient's pain is located the iliac crest and into the groin radiating down the anterior thigh.  Pain is thought to be hip abductor tendinopathy related from origin to the greater trochanter.  Will treat that pain with injection at the area of maximal tenderness which is the origin of the gluteus medius at the iliac crest.  Additionally has some anterior hip pain that could be from intra-articular cause of hip pain such as DJD or labrum tear.  X-ray today shows only minimal DJD.  Consider trial of diagnostic and potentially therapeutic intra-articular hip injection as soon as 1 week if not much better.  Additionally he has pain radiating down the anterior thigh.  This could become  from the hip joint or could be lumbar radiculopathy at the L2 or 3 dermatomal pattern.  If all the above not better lumbar spine should be further evaluated with MRI.   PDMP not reviewed this encounter. Orders Placed This Encounter  Procedures   [place] LIMITED JOINT SPACE STRUCTURES LOW RIGHT(NO LINKED CHARGES)    Order  Specific Question:   Reason for Exam (SYMPTOM  OR DIAGNOSIS REQUIRED)    Answer:   right hip pain    Order Specific Question:   Preferred imaging location?    Answer:   [facility]   DG HIP UNILAT W OR W/O PELVIS 2-3 VIEWS RIGHT    Standing Status:   Future    Number of Occurrences:   1    Standing Expiration Date:   02/04/2023    Order Specific Question:   Reason for Exam (SYMPTOM  OR DIAGNOSIS REQUIRED)    Answer:   right hip pain    Order Specific Question:   Preferred imaging location?    Answer:   [facility]   No orders of the defined types were placed in this encounter.    Discussed warning signs or symptoms. Please see discharge instructions. Patient expresses understanding.   The above documentation has been reviewed and is accurate and complete Lynne Leader, M.D.

## 2023-01-06 NOTE — Progress Notes (Signed)
Subjective:    Patient ID: James [name], male    DOB: July 27, 1956, 67 y.o.   MRN: [id number]  HPI Here due to right hip and leg pain  For 3-4 weeks---had noticed bad right shoulder pain Then was steady and went to Dr Tamala Julian (sports) MRI showed SLAP tear---may be looking at surgery  Now having pain in right hip for a week--- lateral and somewhat posterior---travels to upper anterior thigh Affecting his walking No new tasks--just been golfing  No back pain No leg weakness Taking ibuprofen 836m tid and tylenol---- not really helping much Tried heat--maybe helped very briefly  Current Outpatient Medications on File Prior to Visit  Medication Sig Dispense Refill   aspirin EC 81 MG tablet Take 81 mg by mouth daily.     atorvastatin (LIPITOR) 80 MG tablet Take 1 tablet (80 mg total) by mouth daily. 90 tablet 3   fluconazole (DIFLUCAN) 150 MG tablet Take 1 tablet (150 mg total) by mouth once a week. As needed for recurrent rash 4 tablet 5   nitroGLYCERIN (NITROSTAT) 0.4 MG SL tablet Place 1 tablet (0.4 mg total) under the tongue every 5 (five) minutes as needed for chest pain. 25 tablet 1   selenium sulfide (SELSUN) 2.5 % shampoo Apply 1 Application topically once a week. 118 mL 12   triamcinolone cream (KENALOG) 0.1 % Apply 1 Application topically 2 (two) times daily as needed. 45 g 1   No current facility-administered medications on file prior to visit.    No Known Allergies  Past Medical History:  Diagnosis Date   Adenomatous colon polyp 2010   CAD (coronary artery disease)    Cataract    Duodenal ulcer due to Helicobacter pylori 10000000  Gastric ulcer due to Helicobacter pylori [id number]  Hx of adenomatous colonic polyps 01/26/2009   Hyperlipidemia    [place]    Tinea versicolor     Past Surgical History:  Procedure Laterality Date   COLONOSCOPY     COLONOSCOPY W/ POLYPECTOMY     CORONARY STENT PLACEMENT     at [place]  jaw tumor  Left    POLYPECTOMY     TONSILLECTOMY  1962    Family History  Problem Relation Age of Onset   Colon cancer Neg Hx    Stomach cancer Neg Hx    Esophageal cancer Neg Hx    Pancreatic cancer Neg Hx    Rectal cancer Neg Hx     Social History   Socioeconomic History   Marital status: Married    Spouse name: Not on file   Number of children: 2   Years of education: Not on file   Highest education level: Not on file  Occupational History   Occupation: CHotel manager   Employer: [employer]    Comment: Retired  Tobacco Use   Smoking status: Former    Types: Cigarettes    Quit date: 11/24/1978    Years since quitting: 44.1    Passive exposure: Past   Smokeless tobacco: Never  Substance and Sexual Activity   Alcohol use: Yes    Comment: 1 to 2 beers a month   Drug use: No   Sexual activity: Not on file  Other Topics Concern   Not on file  Social History Narrative   Former minor league shortstop      Has living will   Wife is health care POA--children would be alternates   Would accept  resuscitation   No tube feeds if cognitively unaware   Social Determinants of Health   Financial Resource Strain: Not on file  Food Insecurity: Not on file  Transportation Needs: Not on file  Physical Activity: Not on file  Stress: Not on file  Social Connections: Not on file  Intimate Partner Violence: Not on file   Review of Systems No fever Not sick     Objective:   Physical Exam Constitutional:      Appearance: Normal appearance.  Musculoskeletal:     Comments: No back tenderness SLR negative Normal ROM in right hip Tenderness along lateral right hip  Neurological:     Mental Status: He is alert.     Comments: Very antalgic gait No leg weakness            Assessment & Plan:

## 2023-01-06 NOTE — Assessment & Plan Note (Signed)
Findings negative for radiculopathy and OA in hip I suspect bursitis NSAIDs not helping Discussed regular icing Will see if Dr Tamala Julian can do a cortisone injection

## 2023-01-06 NOTE — Patient Instructions (Signed)
Thank you for coming in today.   Call or go to the ER if you develop a large red swollen joint with extreme pain or oozing puss.    Please get an Xray today before you leave   If this does not work well let me know and I will do a shot in a different part of the hip.

## 2023-01-08 ENCOUNTER — Encounter: Payer: Self-pay | Admitting: Family Medicine

## 2023-01-09 NOTE — Progress Notes (Unsigned)
I, Peterson Lombard, LAT, ATC acting as a scribe for Lynne Leader, MD.  James [name] is a 67 y.o. male who presents to [place] at [facility] today for cont'd R hip/leg pain. Pt was last seen by Dr. Georgina Snell on 01/06/23 and was given a R lateral hip iliac crest area steroid injection. Today, pt reports cont to have pain along the lateral aspect of his R hip. Pt is now having radiating pain along the anterior aspect of his R thigh and lower leg. No back pain. Tingling in the anterior R LE.   Pt also c/o bilat knee, R>L. Pt notes previously aspirations and injections. Swelling present.   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  He rates his pain as 2 out of 10 currently.  He wonders what he should do about it.  He does not think he wants surgery.  He has not had a trial of physical therapy. He lives in [place] [address]  [place] about [place] a half away from [place].   Dx imaging: 01/06/23 R hip XR  03/27/21 Bilat knee standing XR  Pertinent review of systems: No fevers or chills  Relevant historical information: History of DJD knee bilaterally.   Exam:  BP (!) 144/82   Pulse 80   Ht 5' 9.5" (1.765 m)   Wt 213 lb (96.6 kg)   SpO2 97%   BMI 31.00 kg/m  General: Well Developed, well nourished, and in no acute distress.   MSK:  L-spine nontender midline decreased lumbar motion. Lower extremity strength is intact. Reflexes are intact.  Right hip: Normal-appearing Nontender palpation greater trochanter.  Knees bilaterally minimal joint effusion normal motion with crepitation.    Lab and Radiology Results  Procedure: Real-time Ultrasound Guided Injection of right knee superior lateral patellar space Device: Philips Affiniti 50G Images permanently stored and available for review in PACS Verbal informed consent obtained.  Discussed risks and benefits of procedure. Warned about  infection, bleeding, hyperglycemia damage to structures among others. Patient expresses understanding and agreement Time-out conducted.   Noted no overlying erythema, induration, or other signs of local infection.   Skin prepped in a sterile fashion.   Local anesthesia: Topical Ethyl chloride.   With sterile technique and under real time ultrasound guidance: 40 mg of Kenalog and 2 mL Marcaine injected into knee joint. Fluid seen entering the joint capsule.   Completed without difficulty   Pain immediately resolved suggesting accurate placement of the medication.   Advised to call if fevers/chills, erythema, induration, drainage, or persistent bleeding.   Images permanently stored and available for review in the ultrasound unit.  Impression: Technically successful ultrasound guided injection.    Procedure: Real-time Ultrasound Guided Injection of left knee superior lateral patellar space Device: Philips Affiniti 50G Images permanently stored and available for review in PACS Verbal informed consent obtained.  Discussed risks and benefits of procedure. Warned about infection, bleeding, hyperglycemia damage to structures among others. Patient expresses understanding and agreement Time-out conducted.   Noted no overlying erythema, induration, or other signs of local infection.   Skin prepped in a sterile fashion.   Local anesthesia: Topical Ethyl chloride.   With sterile technique and under real time ultrasound guidance: 40 mg of Kenalog and 2 mL Marcaine injected into knee joint. Fluid seen entering the joint capsule.   Completed without difficulty   Pain immediately resolved suggesting accurate placement of the  medication.   Advised to call if fevers/chills, erythema, induration, drainage, or persistent bleeding.   Images permanently stored and available for review in the ultrasound unit.  Impression: Technically successful ultrasound guided injection.   X-ray images L-spine and  bilateral knees obtained today personally independently interpreted  L-spine: Loss of lumbar lordosis.  No acute fractures.  Right knee: Moderate severe medial compartment DJD.  No acute fractures.  Left knee: Severe medial compartment DJD.  No acute fractures.  Await formal radiology review      Assessment and Plan: 67 y.o. male with  Right leg pain thought to be due to lumbar radiculopathy at L3 or L4.  Some of his pain may be interarticular hip related.  Plan for course of prednisone and gabapentin.  Will obtain x-ray lumbar spine and anticipate future lumbar spine MRI if not improved.  Will try physical therapy in [facility] near to where he lives.  Bilateral knee pain thought to be due to DJD.  Plan for steroid injections bilaterally today. Chronic problem acute exacerbation.  Right shoulder pain chronic ongoing issue thought to be due to labrum tear.  Plan for physical therapy again in [place] [facility]. Chronic problem acute exacerbation.    PDMP not reviewed this encounter. Orders Placed This Encounter  Procedures   DG Lumbar Spine 2-3 Views    Standing Status:   Future    Standing Expiration Date:   02/07/2023    Order Specific Question:   Reason for Exam (SYMPTOM  OR DIAGNOSIS REQUIRED)    Answer:   lumbar radicular symptoms    Order Specific Question:   Preferred imaging location?    Answer:   [facility]   [place] LIMITED JOINT SPACE STRUCTURES LOW BILAT(NO LINKED CHARGES)    Order Specific Question:   Reason for Exam (SYMPTOM  OR DIAGNOSIS REQUIRED)    Answer:   bilateral knee pain    Order Specific Question:   Preferred imaging location?    Answer:   [place]   DG Knee AP/LAT W/Sunrise Left    Standing Status:   Future    Standing Expiration Date:   02/10/2023    Order Specific Question:   Reason for Exam (SYMPTOM  OR DIAGNOSIS REQUIRED)    Answer:   bilateral knee pain    Order Specific Question:   Preferred  imaging location?    Answer:   [facility]   DG Knee AP/LAT W/Sunrise Right    Standing Status:   Future    Standing Expiration Date:   02/10/2023    Order Specific Question:   Reason for Exam (SYMPTOM  OR DIAGNOSIS REQUIRED)    Answer:   bilateral knee pain    Order Specific Question:   Preferred imaging location?    Answer:   [facility]   Ambulatory referral to Physical Therapy    Referral Priority:   Routine    Referral Type:   Physical Medicine    Referral Reason:   Specialty Services Required    Requested Specialty:   Physical Therapy    Number of Visits Requested:   1   Meds ordered this encounter  Medications   gabapentin (NEURONTIN) 300 MG capsule    Sig: Take 1 capsule (300 mg total) by mouth 3 (three) times daily as needed.    Dispense:  90 capsule    Refill:  3   predniSONE (DELTASONE) 50 MG tablet    Sig: Take 1  tablet (50 mg total) by mouth daily.    Dispense:  5 tablet    Refill:  0     Discussed warning signs or symptoms. Please see discharge instructions. Patient expresses understanding.   The above documentation has been reviewed and is accurate and complete Lynne Leader, M.D.

## 2023-01-09 NOTE — Progress Notes (Signed)
Right hip x-ray shows no severe arthritis.  No fractures.

## 2023-01-12 ENCOUNTER — Ambulatory Visit (INDEPENDENT_AMBULATORY_CARE_PROVIDER_SITE_OTHER): Payer: Medicare HMO

## 2023-01-12 ENCOUNTER — Ambulatory Visit: Payer: Self-pay

## 2023-01-12 ENCOUNTER — Ambulatory Visit: Payer: Medicare HMO | Admitting: Family Medicine

## 2023-01-12 VITALS — BP 144/82 | HR 80 | Ht 69.5 in | Wt 213.0 lb

## 2023-01-12 DIAGNOSIS — M170 Bilateral primary osteoarthritis of knee: Secondary | ICD-10-CM | POA: Diagnosis not present

## 2023-01-12 DIAGNOSIS — M5416 Radiculopathy, lumbar region: Secondary | ICD-10-CM

## 2023-01-12 DIAGNOSIS — M25551 Pain in right hip: Secondary | ICD-10-CM

## 2023-01-12 DIAGNOSIS — M25511 Pain in right shoulder: Secondary | ICD-10-CM

## 2023-01-12 DIAGNOSIS — M25561 Pain in right knee: Secondary | ICD-10-CM | POA: Diagnosis not present

## 2023-01-12 DIAGNOSIS — G8929 Other chronic pain: Secondary | ICD-10-CM | POA: Diagnosis not present

## 2023-01-12 DIAGNOSIS — M1711 Unilateral primary osteoarthritis, right knee: Secondary | ICD-10-CM | POA: Diagnosis not present

## 2023-01-12 DIAGNOSIS — M25562 Pain in left knee: Secondary | ICD-10-CM | POA: Diagnosis not present

## 2023-01-12 DIAGNOSIS — M1712 Unilateral primary osteoarthritis, left knee: Secondary | ICD-10-CM | POA: Diagnosis not present

## 2023-01-12 DIAGNOSIS — M545 Low back pain, unspecified: Secondary | ICD-10-CM | POA: Diagnosis not present

## 2023-01-12 MED ORDER — GABAPENTIN 300 MG PO CAPS: 300.0000 mg | ORAL_CAPSULE | Freq: Three times a day (TID) | ORAL | 3 refills | Status: DC | PRN

## 2023-01-12 MED ORDER — PREDNISONE 50 MG PO TABS: 50.0000 mg | ORAL_TABLET | Freq: Every day | ORAL | 0 refills | Status: DC

## 2023-01-12 NOTE — Patient Instructions (Addendum)
Thank you for coming in today.   Please get an Xray today before you leave   Take gabapentin mostly at bedtime.   Take prednisone daily for 5 days.   I've referred you to Physical Therapy.  Let us know if you don't hear from them in one week.   You received an injection today. Seek immediate medical attention if the joint becomes red, extremely painful, or is oozing fluid.   Let me know if not better and we will order a MRI

## 2023-01-13 NOTE — Progress Notes (Signed)
Right knee x-ray shows some arthritis changes

## 2023-01-13 NOTE — Progress Notes (Signed)
Left knee x-ray shows arthritis.

## 2023-01-14 NOTE — Progress Notes (Signed)
Lumbar spine x-ray shows some arthritis. 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  This allows the back to move more than it should.  It is probably been ongoing your entire life. Physical therapy to work on core strength will be very helpful.  Make sure the physical therapist knows the diagnosis.  If not better in would be helpful in the future.

## 2023-01-15 ENCOUNTER — Encounter: Payer: Self-pay | Admitting: Family Medicine

## 2023-01-15 ENCOUNTER — Other Ambulatory Visit: Payer: Self-pay

## 2023-01-15 DIAGNOSIS — M5416 Radiculopathy, lumbar region: Secondary | ICD-10-CM

## 2023-01-15 DIAGNOSIS — M25511 Pain in right shoulder: Secondary | ICD-10-CM

## 2023-01-16 NOTE — Progress Notes (Unsigned)
I, Peterson Lombard, LAT, ATC acting as a scribe for Lynne Leader, MD.  James [name] is a 67 y.o. male who presents to [place] at [facility] today for cont'd R leg pain. Pt was last seen by Dr. Georgina Snell on 01/12/23 and was prescribed prednisone, gabapentin, and imaging was obtained. Pt was given a R lateral hip iliac crest steroid injection on 01/06/23. Today, pt reports cont R hip pain, but is feeling much better than prior. Pt has been able to walk w/o a limp.  He notes his hip pain is still lateral and anterior a bit.  Overall he is much improved from where he was.  He like to switch his physical therapy to [facility] in [facility] from a location in [place].  He has not yet started physical therapy and at this location is going to be a better fit for him.  Dx imaging: 01/12/23 L-spine XR 01/06/23 R hip XR   Pertinent review of systems: No fevers or chills  Relevant historical information: CAD.   Exam:  BP 138/84   Pulse 77   Ht 5' 9.5" (1.765 m)   Wt 212 lb (96.2 kg)   SpO2 98%   BMI 30.86 kg/m  General: Well Developed, well nourished, and in no acute distress.   MSK: Right hip normal motion.  Normal gait.    Lab and Radiology Results  [place] LIMITED JOINT SPACE STRUCTURES LOW RIGHT(NO LINKED CHARGES)  Result Date: 01/14/2023 Procedure: Real-time Ultrasound Guided Injection of right lateral hip iliac crest area of maximum tenderness at muscle origin and gluteus medius muscle Device: Philips Affiniti 50G Images permanently stored and available for review in PACS Verbal informed consent obtained.  Discussed risks and benefits of procedure. Warned about infection, bleeding, hyperglycemia damage to structures among others. Patient expresses understanding and agreement Time-out conducted.  Noted no overlying erythema, induration, or other signs of local infection.  Skin prepped in a sterile fashion.  Local anesthesia: Topical Ethyl chloride.  With  sterile technique and under real time ultrasound guidance: 40 mg of Kenalog and 2 mg Marcaine injected into gluteus medius muscle origin and posterior lateral iliac crest lateral hip. Fluid seen entering the iliac crest.  Completed without difficulty  Pain moderately resolved suggesting accurate placement of the medication.  Advised to call if fevers/chills, erythema, induration, drainage, or persistent bleeding.  Images permanently stored and available for review in the ultrasound unit. Impression: Technically successful ultrasound guided injection.   DG Lumbar Spine 2-3 Views  Result Date: 01/13/2023 CLINICAL DATA:  Low back pain for the past 2-3 weeks radiating to the right buttock and right lower extremity. No known injury. EXAM: LUMBAR SPINE - 2-3 VIEW COMPARISON:  None Available. FINDINGS: Five non-rib-bearing lumbar vertebrae. Minimal anterior spur formation at the L4-5 level. 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 Atheromatous arterial calcifications without visible aneurysm. IMPRESSION: 1. 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 2. Minimal degenerative changes at the L4-5 level. Electronically Signed   By: Claudie Revering M.D.   On: 01/13/2023 19:58   DG Knee AP/LAT W/Sunrise Left  Result Date: 01/12/2023 CLINICAL DATA:  Pain EXAM: LEFT KNEE 3 VIEWS COMPARISON:  None Available. FINDINGS: Tricompartmental degenerative changes with significant loss of joint space medially. No fracture, dislocation, or joint effusion. IMPRESSION: Tricompartmental degenerative changes with significant loss of joint space medially. Electronically Signed   By: Dorise Bullion III M.D.   On: 01/12/2023 15:30   DG Knee AP/LAT W/Sunrise Right  Result  Date: 01/12/2023 CLINICAL DATA:  Pain EXAM: RIGHT KNEE 3 VIEWS COMPARISON:  None Available. FINDINGS: Tricompartmental degenerative changes are identified with partial joint space loss medially. No fractures or dislocations. No joint effusion.  No other significant abnormalities. IMPRESSION: Tricompartmental degenerative changes with partial joint space loss medially. Electronically Signed   By: Dorise Bullion III M.D.   On: 01/12/2023 15:29   DG HIP UNILAT W OR W/O PELVIS 2-3 VIEWS RIGHT  Result Date: 01/08/2023 CLINICAL DATA:  Acute right hip pain without known injury. EXAM: DG HIP (WITH OR WITHOUT PELVIS) 2-3V RIGHT COMPARISON:  None Available. FINDINGS: There is no evidence of hip fracture or dislocation. There is no evidence of arthropathy or other focal bone abnormality. IMPRESSION: Negative. Electronically Signed   By: Marijo Conception M.D.   On: 01/08/2023 11:21   [place] LIMITED JOINT SPACE STRUCTURES UP RIGHT(NO LINKED CHARGES)  Result Date: 12/02/2022 Procedure: Real-time Ultrasound Guided Injection of right glenohumeral joint Device: GE Logiq Q7 Ultrasound guided injection is preferred based studies that show increased duration, increased effect, greater accuracy, decreased procedural pain, increased response rate with ultrasound guided versus blind injection. Verbal informed consent obtained. Time-out conducted. Noted no overlying erythema, induration, or other signs of local infection. Skin prepped in a sterile fashion. Local anesthesia: Topical Ethyl chloride. With sterile technique and under real time ultrasound guidance:  Joint visualized.  23g 1  inch needle inserted posterior approach. Pictures taken for needle placement. Patient did have injection of  2 cc of 0.5% Marcaine, and 1.0 cc of Kenalog 40 mg/dL. Completed without difficulty Pain immediately improved  suggesting accurate placement of the medication. Advised to call if fevers/chills, erythema, induration, drainage, or persistent bleeding. Impression: Technically successful ultrasound guided injection.    I, Lynne Leader, personally (independently) visualized and performed the interpretation of the xray images attached in this note.      Assessment and Plan: 67 y.o.  male with right hip pain lumbar radiculopathy pain and bilateral knee pain.  His pain is multifactorial and improving with various treatment options.  We finally got this pain a bit better controlled and is out of crisis now with lateral hip injection and bilateral knee injections.  The suspected lumbar radiculopathy is better controlled with gabapentin.  For now plan for [facility] at [facility].  Check back in 8 weeks.  If worsening or not improved would consider an intra-articular right hip injection.  Also consider lumbar spine MRI potentially.   PDMP not reviewed this encounter. Orders Placed This Encounter  Procedures   Ambulatory referral to Physical Therapy    Referral Priority:   Routine    Referral Type:   Physical Medicine    Referral Reason:   Specialty Services Required    Requested Specialty:   Physical Therapy    Number of Visits Requested:   1   No orders of the defined types were placed in this encounter.    Discussed warning signs or symptoms. Please see discharge instructions. Patient expresses understanding.   The above documentation has been reviewed and is accurate and complete Lynne Leader, M.D.

## 2023-01-19 ENCOUNTER — Ambulatory Visit: Payer: Medicare HMO | Admitting: Family Medicine

## 2023-01-19 VITALS — BP 138/84 | HR 77 | Ht 69.5 in | Wt 212.0 lb

## 2023-01-19 DIAGNOSIS — M25551 Pain in right hip: Secondary | ICD-10-CM | POA: Diagnosis not present

## 2023-01-19 DIAGNOSIS — M170 Bilateral primary osteoarthritis of knee: Secondary | ICD-10-CM

## 2023-01-19 DIAGNOSIS — M5416 Radiculopathy, lumbar region: Secondary | ICD-10-CM

## 2023-01-19 DIAGNOSIS — M25561 Pain in right knee: Secondary | ICD-10-CM | POA: Diagnosis not present

## 2023-01-19 DIAGNOSIS — M25562 Pain in left knee: Secondary | ICD-10-CM

## 2023-01-19 DIAGNOSIS — G8929 Other chronic pain: Secondary | ICD-10-CM | POA: Diagnosis not present

## 2023-01-19 DIAGNOSIS — M1711 Unilateral primary osteoarthritis, right knee: Secondary | ICD-10-CM | POA: Diagnosis not present

## 2023-01-19 DIAGNOSIS — M7601 Gluteal tendinitis, right hip: Secondary | ICD-10-CM | POA: Diagnosis not present

## 2023-01-19 NOTE — Patient Instructions (Signed)
Thank you for coming in today.   Plan on new PT location.  [facility] in [place].   Let me know if that does not work.   Recheck in about 8 weeks which gives you time to complete PT   Ok to return or contact me sooner if you have a problem.

## 2023-01-19 NOTE — Telephone Encounter (Signed)
Will discuss correct PT location referral at time of visit and place to the correct location, as 2 previous referrals have already been placed.

## 2023-01-20 DIAGNOSIS — M7601 Gluteal tendinitis, right hip: Secondary | ICD-10-CM | POA: Diagnosis not present

## 2023-01-20 DIAGNOSIS — M25561 Pain in right knee: Secondary | ICD-10-CM | POA: Diagnosis not present

## 2023-01-22 ENCOUNTER — Ambulatory Visit: Payer: Medicare PPO | Admitting: Family Medicine

## 2023-01-29 DIAGNOSIS — M7601 Gluteal tendinitis, right hip: Secondary | ICD-10-CM | POA: Diagnosis not present

## 2023-01-29 DIAGNOSIS — M25561 Pain in right knee: Secondary | ICD-10-CM | POA: Diagnosis not present

## 2023-02-02 DIAGNOSIS — M25561 Pain in right knee: Secondary | ICD-10-CM | POA: Diagnosis not present

## 2023-02-02 DIAGNOSIS — M7601 Gluteal tendinitis, right hip: Secondary | ICD-10-CM | POA: Diagnosis not present

## 2023-02-05 DIAGNOSIS — M7601 Gluteal tendinitis, right hip: Secondary | ICD-10-CM | POA: Diagnosis not present

## 2023-02-05 DIAGNOSIS — M25561 Pain in right knee: Secondary | ICD-10-CM | POA: Diagnosis not present

## 2023-02-10 DIAGNOSIS — M25561 Pain in right knee: Secondary | ICD-10-CM | POA: Diagnosis not present

## 2023-02-10 DIAGNOSIS — M7601 Gluteal tendinitis, right hip: Secondary | ICD-10-CM | POA: Diagnosis not present

## 2023-02-11 ENCOUNTER — Telehealth: Payer: Medicare HMO | Admitting: Physician Assistant

## 2023-02-11 DIAGNOSIS — H00011 Hordeolum externum right upper eyelid: Secondary | ICD-10-CM

## 2023-02-11 MED ORDER — ERYTHROMYCIN 5 MG/GM OP OINT: 1.0000 | TOPICAL_OINTMENT | Freq: Every day | OPHTHALMIC | 0 refills | Status: DC

## 2023-02-11 NOTE — Progress Notes (Signed)
  E-Visit for Stye   We are sorry that you are not feeling well. Here is how we plan to help!  Based on what you have shared with me it looks like you have a stye.  A stye is an inflammation of the eyelid.  It is often a red, painful lump near the edge of the eyelid that may look like a boil or a pimple.  A stye develops when an infection occurs at the base of an eyelash.   We have made appropriate suggestions for you based upon your presentation: Simple styes can be treated without medical intervention.  Most styes either resolve spontaneously or resolve with simple home treatment by applying warm compresses or heated washcloth to the stye for about 10-15 minutes three to four times a day. This causes the stye to drain and resolve. I have prescribed Erythromycin ointment Apply topically to affected eye daily at bedtime for 5 days.  HOME CARE:  Wash your hands often! Let the stye open on its own. Don't squeeze or open it. Don't rub your eyes. This can irritate your eyes and let in bacteria.  If you need to touch your eyes, wash your hands first. Don't wear eye makeup or contact lenses until the area has healed.  GET HELP RIGHT AWAY IF:  Your symptoms do not improve. You develop blurred or loss of vision. Your symptoms worsen (increased discharge, pain or redness).   Thank you for choosing an e-visit.  Your e-visit answers were reviewed by a board certified advanced clinical practitioner to complete your personal care plan. Depending upon the condition, your plan could have included both over the counter or prescription medications.  Please review your pharmacy choice. Make sure the pharmacy is open so you can pick up prescription now. If there is a problem, you may contact your provider through [employer] and have the prescription routed to another pharmacy.  Your safety is important to [place]. If you have drug allergies check your prescription carefully.   For the next 24 hours you  can use MyChart to ask questions about today's visit, request a non-urgent call back, or ask for a work or school excuse. You will get an email in the next two days asking about your experience. I hope that your e-visit has been valuable and will speed your recovery.  I have spent 5 minutes in review of e-visit questionnaire, review and updating patient chart, medical decision making and response to patient.   Mar Daring, PA-C

## 2023-02-12 DIAGNOSIS — M25561 Pain in right knee: Secondary | ICD-10-CM | POA: Diagnosis not present

## 2023-02-12 DIAGNOSIS — M7601 Gluteal tendinitis, right hip: Secondary | ICD-10-CM | POA: Diagnosis not present

## 2023-06-18 ENCOUNTER — Other Ambulatory Visit: Payer: Self-pay

## 2023-06-18 ENCOUNTER — Ambulatory Visit: Payer: Medicare HMO | Admitting: Family Medicine

## 2023-06-18 VITALS — BP 132/82 | HR 76 | Ht 69.5 in | Wt 216.0 lb

## 2023-06-18 DIAGNOSIS — M25562 Pain in left knee: Secondary | ICD-10-CM

## 2023-06-18 DIAGNOSIS — M25511 Pain in right shoulder: Secondary | ICD-10-CM

## 2023-06-18 DIAGNOSIS — M542 Cervicalgia: Secondary | ICD-10-CM | POA: Diagnosis not present

## 2023-06-18 DIAGNOSIS — M25561 Pain in right knee: Secondary | ICD-10-CM

## 2023-06-18 DIAGNOSIS — G8929 Other chronic pain: Secondary | ICD-10-CM

## 2023-06-18 NOTE — Progress Notes (Signed)
Rubin Payor, PhD, LAT, ATC acting as a scribe for Clementeen Graham, MD.  James [name] is a 67 y.o. male who presents to [facility] at [facility] today for cont'd bilat knee and R shoulder pain. Pt was previously seen by Dr. Denyse Amass on 01/19/23 for his R hip pain and lumbar radiculopathy. Last visit for his knees was on 01/12/23 and he was given bilat knee steroid injections.  Today, pt c/o bilat knee pain returning about a month ago. He notes L knee is worse and is giving out on him. He has an area of pain on the anterior aspect of the L knee, esp w/ walking.   Knee swelling: yes- has improved somewhat  He also reports an exacerbation of his R shoulder pain flaring over the last week and a half. He completed a prior course of PT at [facility]. Pt locates pain to R side of his neck, R trapz, into the R shoulder and slightly into upper arm.  Numbness/tingling: yes- tingles  Dx imaging: 01/12/23 R & L knee XR  10/09/22 C-spine & R shoulder XR  03/27/21 Bilat knee standing XR  Pertinent review of systems: No fevers or chills  Relevant historical information: Knee pain CAD.   Exam:  BP 132/82   Pulse 76   Ht 5' 9.5" (1.765 m)   Wt 216 lb (98 kg)   SpO2 97%   BMI 31.44 kg/m  General: Well Developed, well nourished, and in no acute distress.   MSK: Right shoulder normal-appearing Tender palpation trapezius. Normal motion pain with abduction and internal rotation. Strength intact pain with resisted abduction. Positive empty can test. Positive Hawkins and Neer's test. Mildly positive Yergason's and speeds test.  Knees bilaterally mild effusion normal motion with crepitation.    Lab and Radiology Results  Procedure: Real-time Ultrasound Guided Injection of right shoulder subacromial bursa Device: Philips Affiniti 50G/GE Logiq Images permanently stored and available for review in PACS Ultrasound evaluation prior to injection reveals subacromial  bursitis.  No full-thickness retracted rotator cuff tears are present. Verbal informed consent obtained.  Discussed risks and benefits of procedure. Warned about infection, bleeding, hyperglycemia damage to structures among others. Patient expresses understanding and agreement Time-out conducted.   Noted no overlying erythema, induration, or other signs of local infection.   Skin prepped in a sterile fashion.   Local anesthesia: Topical Ethyl chloride.   With sterile technique and under real time ultrasound guidance: 40 mg of Kenalog and 2 ml of Marcaine injected into subacromial bursa. Fluid seen entering the bursa.   Completed without difficulty   Pain immediately resolved suggesting accurate placement of the medication.   Advised to call if fevers/chills, erythema, induration, drainage, or persistent bleeding.   Images permanently stored and available for review in the ultrasound unit.  Impression: Technically successful ultrasound guided injection.    Procedure: Real-time Ultrasound Guided Injection of right knee joint superior lateral patella space Device: Philips Affiniti 50G/GE Logiq Images permanently stored and available for review in PACS Verbal informed consent obtained.  Discussed risks and benefits of procedure. Warned about infection, bleeding, hyperglycemia damage to structures among others. Patient expresses understanding and agreement Time-out conducted.   Noted no overlying erythema, induration, or other signs of local infection.   Skin prepped in a sterile fashion.   Local anesthesia: Topical Ethyl chloride.   With sterile technique and under real time ultrasound guidance: 40 mg of Kenalog and 2 mL of Marcaine injected into knee joint.  Fluid seen entering the joint capsule.   Completed without difficulty   Pain immediately resolved suggesting accurate placement of the medication.   Advised to call if fevers/chills, erythema, induration, drainage, or persistent bleeding.    Images permanently stored and available for review in the ultrasound unit.  Impression: Technically successful ultrasound guided injection.   Procedure: Real-time Ultrasound Guided Injection of left knee joint superior lateral patella space Device: Philips Affiniti 50G/GE Logiq Images permanently stored and available for review in PACS Verbal informed consent obtained.  Discussed risks and benefits of procedure. Warned about infection, bleeding, hyperglycemia damage to structures among others. Patient expresses understanding and agreement Time-out conducted.   Noted no overlying erythema, induration, or other signs of local infection.   Skin prepped in a sterile fashion.   Local anesthesia: Topical Ethyl chloride.   With sterile technique and under real time ultrasound guidance: 40 mg of Kenalog and 2 mL of Marcaine injected into knee joint. Fluid seen entering the joint capsule.   Completed without difficulty   Pain immediately resolved suggesting accurate placement of the medication.   Advised to call if fevers/chills, erythema, induration, drainage, or persistent bleeding.   Images permanently stored and available for review in the ultrasound unit.  Impression: Technically successful ultrasound guided injection.  EXAM: RIGHT SHOULDER - 2+ VIEW   COMPARISON:  None Available.   FINDINGS: No acute fracture or dislocation. Mild degenerative changes of the acromioclavicular joint. No area of erosion or osseous destruction. No unexpected radiopaque foreign body. Soft tissues are unremarkable.   IMPRESSION: Mild degenerative changes of the acromioclavicular joint.     Electronically Signed   By: Meda Klinefelter M.D.   On: 10/11/2022 17:42  I, Clementeen Graham, personally (independently) visualized and performed the interpretation of the images attached in this note.    Assessment and Plan: 67 y.o. male with chronic right shoulder pain and bilateral knee pain.  Right shoulder  pain previously seen by my partner historically has done pretty well.  However he had an exacerbation recently.  Pain thought to be due to bursitis and impingement.  Plan for subacromial injection and physical therapy referral. 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  Plan for steroid injection.  Previous injection in February 2024 worked until just recently.  PDMP not reviewed this encounter. Orders Placed This Encounter  Procedures   [place] LIMITED JOINT SPACE STRUCTURES LOW BILAT(NO LINKED CHARGES)    Order Specific Question:   Reason for Exam (SYMPTOM  OR DIAGNOSIS REQUIRED)    Answer:   bilateral knee pain    Order Specific Question:   Preferred imaging location?    Answer:   [facility] referral to Physical Therapy    Referral Priority:   Routine    Referral Type:   Physical Medicine    Referral Reason:   Specialty Services Required    Requested Specialty:   Physical Therapy    Number of Visits Requested:   1   No orders of the defined types were placed in this encounter.    Discussed warning signs or symptoms. Please see discharge instructions. Patient expresses understanding.   The above documentation has been reviewed and is accurate and complete Clementeen Graham, M.D.

## 2023-06-18 NOTE — Patient Instructions (Addendum)
Thank you for coming in today.   You received an injection today. Seek immediate medical attention if the joint becomes red, extremely painful, or is oozing fluid.   I've referred you to Physical Therapy.  Let us know if you don't hear from them in one week.   Check back as needed

## 2023-06-19 ENCOUNTER — Ambulatory Visit: Payer: Medicare HMO | Admitting: Family Medicine

## 2023-07-22 DIAGNOSIS — Z1330 Encounter for screening examination for mental health and behavioral disorders, unspecified: Secondary | ICD-10-CM | POA: Diagnosis not present

## 2023-07-22 DIAGNOSIS — I2510 Atherosclerotic heart disease of native coronary artery without angina pectoris: Secondary | ICD-10-CM | POA: Diagnosis not present

## 2023-07-22 DIAGNOSIS — M79601 Pain in right arm: Secondary | ICD-10-CM | POA: Diagnosis not present

## 2023-07-29 DIAGNOSIS — I2510 Atherosclerotic heart disease of native coronary artery without angina pectoris: Secondary | ICD-10-CM | POA: Diagnosis not present

## 2023-07-29 DIAGNOSIS — M79601 Pain in right arm: Secondary | ICD-10-CM | POA: Diagnosis not present

## 2023-08-27 ENCOUNTER — Encounter: Payer: Self-pay | Admitting: Internal Medicine

## 2023-08-27 ENCOUNTER — Ambulatory Visit: Payer: Medicare HMO | Admitting: Internal Medicine

## 2023-08-27 VITALS — BP 108/76 | HR 73 | Temp 98.7°F | Ht 69.5 in | Wt 212.0 lb

## 2023-08-27 DIAGNOSIS — M170 Bilateral primary osteoarthritis of knee: Secondary | ICD-10-CM

## 2023-08-27 DIAGNOSIS — Z0000 Encounter for general adult medical examination without abnormal findings: Secondary | ICD-10-CM | POA: Diagnosis not present

## 2023-08-27 DIAGNOSIS — Z125 Encounter for screening for malignant neoplasm of prostate: Secondary | ICD-10-CM | POA: Diagnosis not present

## 2023-08-27 DIAGNOSIS — I2510 Atherosclerotic heart disease of native coronary artery without angina pectoris: Secondary | ICD-10-CM

## 2023-08-27 DIAGNOSIS — B360 Pityriasis versicolor: Secondary | ICD-10-CM

## 2023-08-27 LAB — PSA, MEDICARE: PSA: 1.57 ng/mL (ref 0.10–4.00)

## 2023-08-27 LAB — COMPREHENSIVE METABOLIC PANEL
ALT: 19 U/L (ref 0–53)
AST: 21 U/L (ref 0–37)
Albumin: 4.3 g/dL (ref 3.5–5.2)
Alkaline Phosphatase: 87 U/L (ref 39–117)
BUN: 10 mg/dL (ref 6–23)
CO2: 30 meq/L (ref 19–32)
Calcium: 9.7 mg/dL (ref 8.4–10.5)
Chloride: 104 meq/L (ref 96–112)
Creatinine, Ser: 1.12 mg/dL (ref 0.40–1.50)
GFR: 67.14 mL/min (ref >60.00)
Glucose, Bld: 112 mg/dL — ABNORMAL HIGH (ref 70–99)
Potassium: 4 meq/L (ref 3.5–5.1)
Sodium: 142 meq/L (ref 135–145)
Total Bilirubin: 0.4 mg/dL (ref 0.2–1.2)
Total Protein: 6.5 g/dL (ref 6.0–8.3)

## 2023-08-27 LAB — CBC
HCT: 47.3 % (ref 39.0–52.0)
Hemoglobin: 14.8 g/dL (ref 13.0–17.0)
MCHC: 31.4 g/dL (ref 30.0–36.0)
MCV: 91.8 fL (ref 78.0–100.0)
Platelets: 238 10*3/uL (ref 150.0–400.0)
RBC: 5.16 Mil/uL (ref 4.22–5.81)
RDW: 13.3 % (ref 11.5–15.5)
WBC: 4.1 10*3/uL (ref 4.0–10.5)

## 2023-08-27 LAB — LIPID PANEL
Cholesterol: 226 mg/dL — ABNORMAL HIGH (ref 0–200)
HDL: 40.2 mg/dL (ref >39.00)
LDL Cholesterol: 135 mg/dL — ABNORMAL HIGH (ref 0–99)
NonHDL: 186.08
Total CHOL/HDL Ratio: 6
Triglycerides: 257 mg/dL — ABNORMAL HIGH (ref 0.0–149.0)
VLDL: 51.4 mg/dL — ABNORMAL HIGH (ref 0.0–40.0)

## 2023-08-27 MED ORDER — SELENIUM SULFIDE 2.5 % EX LOTN: 1.0000 | TOPICAL_LOTION | CUTANEOUS | 12 refills | Status: AC

## 2023-08-27 MED ORDER — FLUCONAZOLE 150 MG PO TABS: 150.0000 mg | ORAL_TABLET | ORAL | 5 refills | Status: DC

## 2023-08-27 NOTE — Progress Notes (Signed)
Subjective:    Patient ID: James [name], male    DOB: 1956-03-22, 67 y.o.   MRN: [id number]  HPI Here for Medicare wellness visit and follow up of chronic health conditions Reviewed advanced directives  Reviewed other doctors--Dr Corey/Smith--sports medicine, Dr Nouriddene--cardiology, [facility] in [place], Dr Palmer--dentist No hospitalizations or surgery in the past year Vision is fine Hearing is fine Occasional alcohol--like monthly No tobacco Exercising regularly No falls No depression or anhedonia Independent with instrumental ADLs No memory loss  Some physical therapy for knees and back Uses compresses on knee--no oral meds Has used diclofenac in the past--not that helpful Occasional motrin 800mg  --- twice a week or so  Has a knot on his right eyelid Noticed it about a month ago No pain  No chest pain No palpitations No dizziness or syncope No SOB No edema  Current Outpatient Medications on File Prior to Visit  Medication Sig Dispense Refill   aspirin EC 81 MG tablet Take 81 mg by mouth daily.     atorvastatin (LIPITOR) 80 MG tablet Take 1 tablet (80 mg total) by mouth daily. 90 tablet 3   nitroGLYCERIN (NITROSTAT) 0.4 MG SL tablet Place 1 tablet (0.4 mg total) under the tongue every 5 (five) minutes as needed for chest pain. 25 tablet 1   selenium sulfide (SELSUN) 2.5 % shampoo Apply 1 Application topically once a week. 118 mL 12   triamcinolone cream (KENALOG) 0.1 % Apply 1 Application topically 2 (two) times daily as needed. 45 g 1   No current facility-administered medications on file prior to visit.    No Known Allergies  Past Medical History:  Diagnosis Date   Adenomatous colon polyp 2010   CAD (coronary artery disease)    Cataract    Duodenal ulcer due to Helicobacter pylori 09/09/2017   Gastric ulcer due to Helicobacter pylori 07/31/2017   Hx of adenomatous colonic polyps 01/26/2009   Hyperlipidemia    MI (myocardial infarction) (HCC)    Tinea  versicolor     Past Surgical History:  Procedure Laterality Date   COLONOSCOPY     COLONOSCOPY W/ POLYPECTOMY     CORONARY STENT PLACEMENT     at [facility]   jaw tumor Left    POLYPECTOMY     TONSILLECTOMY  1962    Family History  Problem Relation Age of Onset   Colon cancer Neg Hx    Stomach cancer Neg Hx    Esophageal cancer Neg Hx    Pancreatic cancer Neg Hx    Rectal cancer Neg Hx     Social History   Socioeconomic History   Marital status: Married    Spouse name: Not on file   Number of children: 2   Years of education: Not on file   Highest education level: Not on file  Occupational History   Occupation: Wellsite geologist    Employer: [employer]    Comment: Retired  Tobacco Use   Smoking status: Former    Current packs/day: 0.00    Types: Cigarettes    Quit date: 11/24/1978    Years since quitting: 44.7    Passive exposure: Past   Smokeless tobacco: Never  Substance and Sexual Activity   Alcohol use: Yes    Comment: 1 to 2 beers a month   Drug use: No   Sexual activity: Not on file  Other Topics Concern   Not on file  Social History Narrative   Former minor league shortstop  Has living will   Wife is health care POA--children would be alternates   Would accept resuscitation   No tube feeds if cognitively unaware   Social Determinants of Health   Financial Resource Strain: Low Risk  (07/21/2023)   Received from [facility]   Overall Financial Resource Strain (CARDIA)    Difficulty of Paying Living Expenses: Not hard at all  Food Insecurity: No Food Insecurity (07/21/2023)   Received from [facility]   Hunger Vital Sign    Worried About Running Out of Food in the Last Year: Never true    Ran Out of Food in the Last Year: Never true  Transportation Needs: No Transportation Needs (07/21/2023)   Received from [facility] - Transportation    Lack of Transportation (Medical): No    Lack of Transportation  (Non-Medical): No  Physical Activity: Unknown (07/21/2023)   Received from [facility]   Exercise Vital Sign    Days of Exercise per Week: 3 days    Minutes of Exercise per Session: Not on file  Stress: No Stress Concern Present (07/21/2023)   Received from [facility] - Occupational Stress Questionnaire    Feeling of Stress : Not at all  Social Connections: Socially Integrated (07/21/2023)   Received from [facility]   Social Network    How would you rate your social network (family, work, friends)?: Good participation with social networks  Intimate Partner Violence: Not At Risk (07/21/2023)   Received from [facility]   HITS    Over the last 12 months how often did your partner physically hurt you?: 1    Over the last 12 months how often did your partner insult you or talk down to you?: 1    Over the last 12 months how often did your partner threaten you with physical harm?: 1    Over the last 12 months how often did your partner scream or curse at you?: 1   Review of Systems Appetite is good Has lost 5# since last year Sleeps well Wears seat belt Teeth are okay--keeps up with dentist No suspicious skin lesions No heartburn or dysphagia Bowels normal --no blood Voids well--stream is good and no dribbling    Objective:   Physical Exam Constitutional:      Appearance: Normal appearance.  HENT:     Mouth/Throat:     Pharynx: No oropharyngeal exudate or posterior oropharyngeal erythema.  Eyes:     Conjunctiva/sclera: Conjunctivae normal.     Pupils: Pupils are equal, round, and reactive to light.     Comments: Small chalazion on right upper lid (action only if it bothers him)  Cardiovascular:     Rate and Rhythm: Normal rate and regular rhythm.     Pulses: Normal pulses.     Heart sounds: No murmur heard.    No gallop.  Pulmonary:     Effort: Pulmonary effort is normal.     Breath sounds: Normal breath sounds. No wheezing  or rales.  Abdominal:     Palpations: Abdomen is soft.     Tenderness: There is no abdominal tenderness.  Musculoskeletal:     Cervical back: Neck supple.     Right lower leg: No edema.     Left lower leg: No edema.  Lymphadenopathy:     Cervical: No cervical adenopathy.  Skin:    Findings: No lesion or rash.  Neurological:     General: No  focal deficit present.     Mental Status: He is alert and oriented to person, place, and time.     Comments: Word naming---14/1 minute Recall 2/3  Psychiatric:        Mood and Affect: Mood normal.        Behavior: Behavior normal.            Assessment & Plan:

## 2023-08-27 NOTE — Assessment & Plan Note (Signed)
I have personally reviewed the Medicare Annual Wellness questionnaire and have noted 1. The patient's medical and social history 2. Their use of alcohol, tobacco or illicit drugs 3. Their current medications and supplements 4. The patient's functional ability including ADL's, fall risks, home safety risks and hearing or visual             impairment. 5. Diet and physical activities 6. Evidence for depression or mood disorders  The patients weight, height, BMI and visual acuity have been recorded in the chart I have made referrals, counseling and provided education to the patient based review of the above and I have provided the pt with a written personalized care plan for preventive services.  I have provided you with a copy of your personalized plan for preventive services. Please take the time to review along with your updated medication list.  Exercising regularly again Colon due again later this year Will check PSA COVID vaccine at the pharmacy Still prefers no prevnar or flu vaccine

## 2023-08-27 NOTE — Assessment & Plan Note (Signed)
Doing PT Uses ibuprofen occasionally

## 2023-08-27 NOTE — Assessment & Plan Note (Signed)
Uses weekly fluconazole Out of selenium --will refill

## 2023-08-27 NOTE — Progress Notes (Signed)
Hearing Screening - Comments:: Passed whisper test Vision Screening - Comments:: February 2024  

## 2023-08-27 NOTE — Assessment & Plan Note (Signed)
Has been quiet  On ASA 81 and atorvastatin 80

## 2023-08-30 DIAGNOSIS — R21 Rash and other nonspecific skin eruption: Secondary | ICD-10-CM | POA: Diagnosis not present

## 2023-09-29 DIAGNOSIS — E785 Hyperlipidemia, unspecified: Secondary | ICD-10-CM | POA: Diagnosis not present

## 2023-09-29 DIAGNOSIS — T23269A Burn of second degree of back of unspecified hand, initial encounter: Secondary | ICD-10-CM | POA: Diagnosis not present

## 2023-10-14 ENCOUNTER — Encounter: Payer: Self-pay | Admitting: Internal Medicine

## 2023-12-18 NOTE — Progress Notes (Unsigned)
[name] [address] [place] [address] Phone: [phone] Subjective:   James [name] am a scribe for Dr. Katrinka Blazing.     I'm seeing this patient by the request  of:  Karie Schwalbe, MD  CC: Bilateral knee and back pain  [id number]  11/27/2022 Patient given injection and tolerated the procedure well, discussed icing regimen and home exercises, which activities to do and which ones to avoid.  Increase activity slowly over the course the next several weeks.  Follow-up again in 6 to 8 weeks     Updated 12/21/2022 James [name] is a 68 y.o. male coming in with complaint of B knee pain last seen by another provider in July 2024 where patient was given injection into his right and left knee. Patient c/o pain in GT of R hip. Both knees are also painful.   Also L shoulder pain on top of shoulder. Painful more at night than during the day. Pain is intermittent. Return       Past Medical History:  Diagnosis Date   Adenomatous colon polyp 2010   CAD (coronary artery disease)    Cataract    Duodenal ulcer due to Helicobacter pylori 09/09/2017   Gastric ulcer due to Helicobacter pylori 07/31/2017   Hx of adenomatous colonic polyps 01/26/2009   Hyperlipidemia    MI (myocardial infarction) (HCC)    Tinea versicolor    Past Surgical History:  Procedure Laterality Date   COLONOSCOPY     COLONOSCOPY W/ POLYPECTOMY     CORONARY STENT PLACEMENT     at [facility]   jaw tumor Left    POLYPECTOMY     TONSILLECTOMY  1962   Social History   Socioeconomic History   Marital status: Married    Spouse name: Not on file   Number of children: 2   Years of education: Not on file   Highest education level: Not on file  Occupational History   Occupation: Wellsite geologist    Employer: [employer]    Comment: Retired  Tobacco Use   Smoking status: Former    Current packs/day: 0.00    Types: Cigarettes    Quit date: 11/24/1978     Years since quitting: 45.1    Passive exposure: Past   Smokeless tobacco: Never  Substance and Sexual Activity   Alcohol use: Yes    Comment: 1 to 2 beers a month   Drug use: No   Sexual activity: Not on file  Other Topics Concern   Not on file  Social History Narrative   Former minor league shortstop      Has living will   Wife is health care POA--children would be alternates   Would accept resuscitation   No tube feeds if cognitively unaware   Social Drivers of Health   Financial Resource Strain: Low Risk  (07/21/2023)   Received from [facility]   Overall Financial Resource Strain (CARDIA)    Difficulty of Paying Living Expenses: Not hard at all  Food Insecurity: No Food Insecurity (07/21/2023)   Received from [facility]   Hunger Vital Sign    Worried About Running Out of Food in the Last Year: Never true    Ran Out of Food in the Last Year: Never true  Transportation Needs: No Transportation Needs (07/21/2023)   Received from [facility] - Transportation    Lack of Transportation (Medical): No    Lack of Transportation (  Non-Medical): No  Physical Activity: Unknown (07/21/2023)   Received from [facility]   Exercise Vital Sign    Days of Exercise per Week: 3 days    Minutes of Exercise per Session: Not on file  Stress: No Stress Concern Present (07/21/2023)   Received from [facility] of Occupational Health - Occupational Stress Questionnaire    Feeling of Stress : Not at all  Social Connections: Socially Integrated (07/21/2023)   Received from [facility]   Social Network    How would you rate your social network (family, work, friends)?: Good participation with social networks   No Known Allergies Family History  Problem Relation Age of Onset   Colon cancer Neg Hx    Stomach cancer Neg Hx    Esophageal cancer Neg Hx    Pancreatic cancer Neg Hx    Rectal cancer Neg Hx      Current Outpatient Medications  (Cardiovascular):    atorvastatin (LIPITOR) 80 MG tablet, Take 1 tablet (80 mg total) by mouth daily.   nitroGLYCERIN (NITROSTAT) 0.4 MG SL tablet, Place 1 tablet (0.4 mg total) under the tongue every 5 (five) minutes as needed for chest pain.   Current Outpatient Medications (Analgesics):    aspirin EC 81 MG tablet, Take 81 mg by mouth daily.   Current Outpatient Medications (Other):    fluconazole (DIFLUCAN) 150 MG tablet, Take 1 tablet (150 mg total) by mouth once a week.   selenium sulfide (SELSUN) 2.5 % lotion, Apply 1 Application topically once a week.   triamcinolone cream (KENALOG) 0.1 %, Apply 1 Application topically 2 (two) times daily as needed.   Reviewed prior external information including notes and imaging from  primary care provider As well as notes that were available from care everywhere and other healthcare systems.  Past medical history, social, surgical and family history all reviewed in electronic medical record.  No pertanent information unless stated regarding to the chief complaint.   Review of Systems:  No headache, visual changes, nausea, vomiting, diarrhea, constipation, dizziness, abdominal pain, skin rash, fevers, chills, night sweats, weight loss, swollen lymph nodes, body aches, joint swelling, chest pain, shortness of breath, mood changes. POSITIVE muscle aches  Objective  Blood pressure 120/70, pulse 71, height 5' 9.5" (1.765 m), weight 225 lb (102.1 kg), SpO2 96%.   General: No apparent distress alert and oriented x3 mood and affect normal, dressed appropriately.  HEENT: Pupils equal, extraocular movements intact  Respiratory: Patient's speak in full sentences and does not appear short of breath  Cardiovascular: No lower extremity edema, non tender, no erythema  Bilateral knee exam shows trace effusion noted.  No severe instability of the knees noted bilaterally.  Tenderness to palpation noted.  Lacks last 5 degrees of extension noted.   After  informed written and verbal consent, patient was seated on exam table. Right knee was prepped with alcohol swab and utilizing anterolateral approach, patient's right knee space was injected with 4:1  marcaine 0.5%: Kenalog 40mg /dL. Patient tolerated the procedure well without immediate complications.  After informed written and verbal consent, patient was seated on exam table. Left knee was prepped with alcohol swab and utilizing anterolateral approach, patient's left knee space was injected with 4:1  marcaine 0.5%: Kenalog 40mg /dL. Patient tolerated the procedure well without immediate complications.   After verbal consent patient was prepped with alcohol swab and with a 21-gauge 2 inch needle injected into the right greater trochanteric area with 2 cc of 0.5%  Marcaine and 1 cc of Kenalog 40 mg/mL.  No blood loss.  Band-Aid placed.  Postinjection instructions given  After verbal consent patient was prepped with alcohol swab and with a 25-gauge half inch needle injected with 0.5 cc of 0.5% Marcaine in the left acromioclavicular joint.  This also had 1 cc of Kenalog 40 mg/mL.  No blood loss.  Band-Aid placed.  Postinjection instructions given   Impression and Recommendations:     The above documentation has been reviewed and is accurate and complete Judi Saa, DO

## 2023-12-22 ENCOUNTER — Ambulatory Visit: Payer: Medicare HMO | Admitting: Family Medicine

## 2023-12-22 ENCOUNTER — Encounter: Payer: Self-pay | Admitting: Family Medicine

## 2023-12-22 VITALS — BP 120/70 | HR 71 | Ht 69.5 in | Wt 225.0 lb

## 2023-12-22 DIAGNOSIS — M19012 Primary osteoarthritis, left shoulder: Secondary | ICD-10-CM | POA: Diagnosis not present

## 2023-12-22 DIAGNOSIS — M7061 Trochanteric bursitis, right hip: Secondary | ICD-10-CM

## 2023-12-22 DIAGNOSIS — M25561 Pain in right knee: Secondary | ICD-10-CM

## 2023-12-22 DIAGNOSIS — M170 Bilateral primary osteoarthritis of knee: Secondary | ICD-10-CM | POA: Diagnosis not present

## 2023-12-22 NOTE — Patient Instructions (Addendum)
Injected AC, R GT, and bilateral knees. Keep hands in peripheral vision. Return in 6 to 8 weeks.

## 2023-12-22 NOTE — Assessment & Plan Note (Signed)
Left side today, patient has had it on the contralateral side previously.  Will continue to monitor.  Patient has had this difficulty but understands the home exercises and will work on certain ergonomics.  Follow-up with me again in 2 months

## 2023-12-22 NOTE — Assessment & Plan Note (Signed)
Chronic problem with exacerbation.  Continues to have some trouble.  Could be secondary to some lumbar radiculopathy as well.  Given injection and side of the hip and hopeful that this will continue to help patient at the moment.  Discussed icing regimen and home exercises.  Increase activity slowly.  Discussed icing regimen.  Increase activity slowly otherwise.  Follow-up again in 8 weeks

## 2023-12-22 NOTE — Assessment & Plan Note (Signed)
Severe arthritic changes noted bilaterally.  Discussed with patient about different treatment options.  Due to patient's history of gastric ulcer and unable to do any type of oral anti-inflammatories have elected to try the steroid injections.  Discussed with patient about icing regimen and home exercises.  He still wants to avoid any surgical intervention.  Have tried viscosupplementation in the past and may want to consider again but has had better response to the steroid injections.  Follow-up with me again in 6 to 8 weeks.

## 2023-12-28 ENCOUNTER — Other Ambulatory Visit: Payer: Self-pay

## 2023-12-28 MED ORDER — GABAPENTIN 300 MG PO CAPS: 300.0000 mg | ORAL_CAPSULE | Freq: Three times a day (TID) | ORAL | 3 refills | Status: DC | PRN

## 2024-01-27 DIAGNOSIS — I255 Ischemic cardiomyopathy: Secondary | ICD-10-CM | POA: Diagnosis not present

## 2024-01-27 DIAGNOSIS — I2510 Atherosclerotic heart disease of native coronary artery without angina pectoris: Secondary | ICD-10-CM | POA: Diagnosis not present

## 2024-01-27 DIAGNOSIS — Z1330 Encounter for screening examination for mental health and behavioral disorders, unspecified: Secondary | ICD-10-CM | POA: Diagnosis not present

## 2024-02-17 NOTE — Progress Notes (Unsigned)
 [name] [address] [place] [address] Phone: [phone] Subjective:   James [name], am serving as a scribe for Dr. Antoine Primas.  I'm seeing this patient by the request  of:  Karie Schwalbe, MD  CC: Bilateral knee and right sided hip pain  [id number]  12/22/2023 Severe arthritic changes noted bilaterally.  Discussed with patient about different treatment options.  Due to patient's history of gastric ulcer and unable to do any type of oral anti-inflammatories have elected to try the steroid injections.  Discussed with patient about icing regimen and home exercises.  He still wants to avoid any surgical intervention.  Have tried viscosupplementation in the past and may want to consider again but has had better response to the steroid injections.  Follow-up with me again in 6 to 8 weeks.     Left side today, patient has had it on the contralateral side previously.  Will continue to monitor.  Patient has had this difficulty but understands the home exercises and will work on certain ergonomics.  Follow-up with me again in 2 months     Chronic problem with exacerbation.  Continues to have some trouble.  Could be secondary to some lumbar radiculopathy as well.  Given injection and side of the hip and hopeful that this will continue to help patient at the moment.  Discussed icing regimen and home exercises.  Increase activity slowly.  Discussed icing regimen.  Increase activity slowly otherwise.  Follow-up again in 8 weeks      Update 02/18/2024 James [name] is a 68 y.o. male coming in with complaint of R hip pain. Patient states that his hip is worse than last visit. Painful over lateral aspect.  Both knees are buckling on him. Would like visco injections again.        Past Medical History:  Diagnosis Date   Adenomatous colon polyp 2010   CAD (coronary artery disease)    Cataract    Duodenal ulcer due to Helicobacter pylori  09/09/2017   Gastric ulcer due to Helicobacter pylori 07/31/2017   Hx of adenomatous colonic polyps 01/26/2009   Hyperlipidemia    MI (myocardial infarction) (HCC)    Tinea versicolor    Past Surgical History:  Procedure Laterality Date   COLONOSCOPY     COLONOSCOPY W/ POLYPECTOMY     CORONARY STENT PLACEMENT     at [facility]   jaw tumor Left    POLYPECTOMY     TONSILLECTOMY  1962   Social History   Socioeconomic History   Marital status: Married    Spouse name: Not on file   Number of children: 2   Years of education: Not on file   Highest education level: Not on file  Occupational History   Occupation: Wellsite geologist    Employer: [employer]    Comment: Retired  Tobacco Use   Smoking status: Former    Current packs/day: 0.00    Types: Cigarettes    Quit date: 11/24/1978    Years since quitting: 45.2    Passive exposure: Past   Smokeless tobacco: Never  Substance and Sexual Activity   Alcohol use: Yes    Comment: 1 to 2 beers a month   Drug use: No   Sexual activity: Not on file  Other Topics Concern   Not on file  Social History Narrative   Former minor league shortstop      Has living will   Wife  is health care POA--children would be alternates   Would accept resuscitation   No tube feeds if cognitively unaware   Social Drivers of Health   Financial Resource Strain: Low Risk  (07/21/2023)   Received from [facility]   Overall Financial Resource Strain (CARDIA)    Difficulty of Paying Living Expenses: Not hard at all  Food Insecurity: No Food Insecurity (07/21/2023)   Received from [facility]   Hunger Vital Sign    Worried About Running Out of Food in the Last Year: Never true    Ran Out of Food in the Last Year: Never true  Transportation Needs: No Transportation Needs (07/21/2023)   Received from [facility] - Transportation    Lack of Transportation (Medical): No    Lack of Transportation (Non-Medical): No   Physical Activity: Unknown (07/21/2023)   Received from [name]   Exercise Vital Sign    Days of Exercise per Week: 3 days    Minutes of Exercise per Session: Not on file  Stress: No Stress Concern Present (07/21/2023)   Received from [facility] - Occupational Stress Questionnaire    Feeling of Stress : Not at all  Social Connections: Socially Integrated (07/21/2023)   Received from [facility]   Social Network    How would you rate your social network (family, work, friends)?: Good participation with social networks   No Known Allergies Family History  Problem Relation Age of Onset   Colon cancer Neg Hx    Stomach cancer Neg Hx    Esophageal cancer Neg Hx    Pancreatic cancer Neg Hx    Rectal cancer Neg Hx     Current Outpatient Medications (Endocrine & Metabolic):    predniSONE (DELTASONE) 20 MG tablet, Take 2 tablets (40 mg total) by mouth daily with breakfast.  Current Outpatient Medications (Cardiovascular):    atorvastatin (LIPITOR) 80 MG tablet, Take 1 tablet (80 mg total) by mouth daily.   nitroGLYCERIN (NITROSTAT) 0.4 MG SL tablet, Place 1 tablet (0.4 mg total) under the tongue every 5 (five) minutes as needed for chest pain.   Current Outpatient Medications (Analgesics):    aspirin EC 81 MG tablet, Take 81 mg by mouth daily.   Current Outpatient Medications (Other):    fluconazole (DIFLUCAN) 150 MG tablet, Take 1 tablet (150 mg total) by mouth once a week.   gabapentin (NEURONTIN) 100 MG capsule, Take 2 capsules (200 mg total) by mouth at bedtime.   selenium sulfide (SELSUN) 2.5 % lotion, Apply 1 Application topically once a week.   triamcinolone cream (KENALOG) 0.1 %, Apply 1 Application topically 2 (two) times daily as needed.   Reviewed prior external information including notes and imaging from  primary care provider As well as notes that were available from care everywhere and other healthcare  systems.  Past medical history, social, surgical and family history all reviewed in electronic medical record.  No pertanent information unless stated regarding to the chief complaint.   Review of Systems:  No headache, visual changes, nausea, vomiting, diarrhea, constipation, dizziness, abdominal pain, skin rash, fevers, chills, night sweats, weight loss, swollen lymph nodes, body aches, joint swelling, chest pain, shortness of breath, mood changes. POSITIVE muscle aches  Objective  Blood pressure 124/82, pulse 69, height 5' 9.5" (1.765 m), weight 226 lb (102.5 kg), SpO2 99%.   General: No apparent distress alert and oriented x3 mood and affect normal, dressed appropriately.  HEENT:  Pupils equal, extraocular movements intact  Respiratory: Patient's speak in full sentences and does not appear short of breath  Cardiovascular: No lower extremity edema, non tender, no erythema  Bilateral knees do have crepitus noted.  Some tenderness to palpation bilaterally with varus deformity noted.  Trace effusion noted of the right knee Right hip pain over the tensor fascia lata and the greater trochanteric area.  After informed written and verbal consent, patient was seated on exam table. Right knee was prepped with alcohol swab and utilizing anterolateral approach, patient's right knee space was injected with 4:1  marcaine 0.5%: Kenalog 40mg /dL. Patient tolerated the procedure well without immediate complications.  After informed written and verbal consent, patient was seated on exam table. Left knee was prepped with alcohol swab and utilizing anterolateral approach, patient's left knee space was injected with 4:1  marcaine 0.5%: Kenalog 40mg /dL. Patient tolerated the procedure well without immediate complications.   Procedure: Real-time Ultrasound Guided Injection of right greater trochanteric bursitis secondary to patient's body habitus Device: GE Logiq Q7 Ultrasound guided injection is preferred based  studies that show increased duration, increased effect, greater accuracy, decreased procedural pain, increased response rate, and decreased cost with ultrasound guided versus blind injection.  Verbal informed consent obtained.  Time-out conducted.  Noted no overlying erythema, induration, or other signs of local infection.  Skin prepped in a sterile fashion.  Local anesthesia: Topical Ethyl chloride.  With sterile technique and under real time ultrasound guidance:  Greater trochanteric area was visualized and patient's bursa was noted. A 22-gauge 3 inch needle was inserted and 4 cc of 0.5% Marcaine and 1 cc of Kenalog 40 mg/dL was injected. Pictures taken Completed without difficulty  Pain immediately resolved suggesting accurate placement of the medication.  Advised to call if fevers/chills, erythema, induration, drainage, or persistent bleeding.  Images permanently stored  Impression: Technically successful ultrasound guided injection.    Impression and Recommendations:    The above documentation has been reviewed and is accurate and complete Judi Saa, DO

## 2024-02-18 ENCOUNTER — Telehealth: Payer: Self-pay

## 2024-02-18 ENCOUNTER — Ambulatory Visit: Admitting: Family Medicine

## 2024-02-18 ENCOUNTER — Encounter: Payer: Self-pay | Admitting: Family Medicine

## 2024-02-18 ENCOUNTER — Other Ambulatory Visit: Payer: Self-pay

## 2024-02-18 VITALS — BP 124/82 | HR 69 | Ht 69.5 in | Wt 226.0 lb

## 2024-02-18 DIAGNOSIS — M25551 Pain in right hip: Secondary | ICD-10-CM | POA: Diagnosis not present

## 2024-02-18 DIAGNOSIS — M7061 Trochanteric bursitis, right hip: Secondary | ICD-10-CM | POA: Diagnosis not present

## 2024-02-18 DIAGNOSIS — M170 Bilateral primary osteoarthritis of knee: Secondary | ICD-10-CM

## 2024-02-18 MED ORDER — PREDNISONE 20 MG PO TABS: 40.0000 mg | ORAL_TABLET | Freq: Every day | ORAL | 0 refills | Status: DC

## 2024-02-18 MED ORDER — GABAPENTIN 100 MG PO CAPS: 200.0000 mg | ORAL_CAPSULE | Freq: Every day | ORAL | 0 refills | Status: DC

## 2024-02-18 NOTE — Assessment & Plan Note (Addendum)
 Discussed with patient about hip abductor strengthening.  Differential includes lumbar radiculopathy that we will need to monitor.  I do still believe it is more secondary to the compensation the patient does secondary to the discomfort and pain the patient has.  We discussed with patient about icing regimen and home exercises otherwise, increase activity slowly.  Discussed icing regimen.  Follow-up again in 6 to 8 weeks due to the differential also given prednisone and gabapentin.  Prednisone 40 mg daily for 5 days and gabapentin 200 mg at night

## 2024-02-18 NOTE — Assessment & Plan Note (Signed)
 Chronic problem with masturbation.  Will consider the possibility for viscosupplementation which patient has responded to previously.  Discussed icing regimen and home exercises, discussed the importance of weight loss.  Patient will start working on this on a more regular basis.  Follow-up again in 6 to 8 weeks otherwise

## 2024-02-18 NOTE — Telephone Encounter (Signed)
 Patient needs an appointment when medication has been stocked.   Monovisc for bilateral knees approved Deductible does not apply. Once the OOP has been met, patient is covered at 100%. Only one copay per date of service. Prior authorization is not required. [employer] requires the provider to refer to their fee schedule for the cost of the drug.   Case [id number]  Exp: 08/20/2024

## 2024-02-18 NOTE — Patient Instructions (Signed)
 Prednisone daily for 5 days-start tomorrow Will get approval for gel 200 mg of gabapentin at night See me in 6-8 weeks

## 2024-02-19 NOTE — Telephone Encounter (Signed)
 Patient is scheduled for 04/06/24 no need to call to get in sooner

## 2024-02-22 NOTE — Telephone Encounter (Signed)
 Noted.

## 2024-04-05 NOTE — Progress Notes (Addendum)
 [facility] [address] [place] [address] Phone: [phone] Subjective:   James [name], am serving as a scribe for Dr. Ronnell Coins.  I'm seeing this patient by the request  of:  Helaine Llanos, MD  CC: Right hip and bilateral knee pain  [id number]  02/18/2024 Discussed with patient about hip abductor strengthening.  Differential includes lumbar radiculopathy that we will need to monitor.  I do still believe it is more secondary to the compensation the patient does secondary to the discomfort and pain the patient has.  We discussed with patient about icing regimen and home exercises otherwise, increase activity slowly.  Discussed icing regimen.  Follow-up again in 6 to 8 weeks due to the differential also given prednisone  and gabapentin .  Prednisone  40 mg daily for 5 days and gabapentin  200 mg at night     Chronic problem with exacerbation.  Will consider the possibility for viscosupplementation which patient has responded to previously.  Discussed icing regimen and home exercises, discussed the importance of weight loss.  Patient will start working on this on a more regular basis.  Follow-up again in 6 to 8 weeks otherwise      Update 04/06/2024 James [name] is a 67 y.o. male coming in with complaint of R hip and B knee pain. Monovisc approved.  Patient is having some pain but nothing severe at the moment.  R hip injection was not helpful.  Patient states that it only helped for multiple minutes.  Seems to be more on the anterior aspect of the hip.  Denies any groin pain noted.  R shoulder pain is worsening.  Waking him up at night.  Some limited range of motion in different areas.  Denies any radiation down the arm.  Denies any numbness in the hands.       Past Medical History:  Diagnosis Date   Adenomatous colon polyp 2010   CAD (coronary artery disease)    Cataract    Duodenal ulcer due to Helicobacter pylori 09/09/2017    Gastric ulcer due to Helicobacter pylori 07/31/2017   Hx of adenomatous colonic polyps 01/26/2009   Hyperlipidemia    MI (myocardial infarction) (HCC)    Tinea versicolor    Past Surgical History:  Procedure Laterality Date   COLONOSCOPY     COLONOSCOPY W/ POLYPECTOMY     CORONARY STENT PLACEMENT     at [facility]   jaw tumor Left    POLYPECTOMY     TONSILLECTOMY  1962   Social History   Socioeconomic History   Marital status: Married    Spouse name: Not on file   Number of children: 2   Years of education: Not on file   Highest education level: Not on file  Occupational History   Occupation: Wellsite geologist    Employer: [employer]    Comment: Retired  Tobacco Use   Smoking status: Former    Current packs/day: 0.00    Types: Cigarettes    Quit date: 11/24/1978    Years since quitting: 45.3    Passive exposure: Past   Smokeless tobacco: Never  Substance and Sexual Activity   Alcohol use: Yes    Comment: 1 to 2 beers a month   Drug use: No   Sexual activity: Not on file  Other Topics Concern   Not on file  Social History Narrative   Former minor league shortstop      Has living will  Wife is health care POA--children would be alternates   Would accept resuscitation   No tube feeds if cognitively unaware   Social Drivers of Health   Financial Resource Strain: Low Risk  (07/21/2023)   Received from [facility]   Overall Financial Resource Strain (CARDIA)    Difficulty of Paying Living Expenses: Not hard at all  Food Insecurity: No Food Insecurity (07/21/2023)   Received from [facility]   Hunger Vital Sign    Worried About Running Out of Food in the Last Year: Never true    Ran Out of Food in the Last Year: Never true  Transportation Needs: No Transportation Needs (07/21/2023)   Received from [facility] - Transportation    Lack of Transportation (Medical): No    Lack of Transportation (Non-Medical): No  Physical Activity:  Unknown (07/21/2023)   Received from [facility]   Exercise Vital Sign    Days of Exercise per Week: 3 days    Minutes of Exercise per Session: Not on file  Stress: No Stress Concern Present (07/21/2023)   Received from [facility] - Occupational Stress Questionnaire    Feeling of Stress : Not at all  Social Connections: Socially Integrated (07/21/2023)   Received from [facility]   Social Network    How would you rate your social network (family, work, friends)?: Good participation with social networks   No Known Allergies Family History  Problem Relation Age of Onset   Colon cancer Neg Hx    Stomach cancer Neg Hx    Esophageal cancer Neg Hx    Pancreatic cancer Neg Hx    Rectal cancer Neg Hx     Current Outpatient Medications (Endocrine & Metabolic):    predniSONE  (DELTASONE ) 20 MG tablet, Take 2 tablets (40 mg total) by mouth daily with breakfast.  Current Outpatient Medications (Cardiovascular):    atorvastatin  (LIPITOR) 80 MG tablet, Take 1 tablet (80 mg total) by mouth daily.   nitroGLYCERIN  (NITROSTAT ) 0.4 MG SL tablet, Place 1 tablet (0.4 mg total) under the tongue every 5 (five) minutes as needed for chest pain.   Current Outpatient Medications (Analgesics):    aspirin EC 81 MG tablet, Take 81 mg by mouth daily.   Current Outpatient Medications (Other):    fluconazole  (DIFLUCAN ) 150 MG tablet, Take 1 tablet (150 mg total) by mouth once a week.   gabapentin  (NEURONTIN ) 100 MG capsule, Take 2 capsules (200 mg total) by mouth at bedtime.   selenium  sulfide (SELSUN ) 2.5 % lotion, Apply 1 Application topically once a week.   triamcinolone  cream (KENALOG ) 0.1 %, Apply 1 Application topically 2 (two) times daily as needed.   Reviewed prior external information including notes and imaging from  primary care provider As well as notes that were available from care everywhere and other healthcare systems.  Past medical  history, social, surgical and family history all reviewed in electronic medical record.  No pertanent information unless stated regarding to the chief complaint.   Review of Systems:  No headache, visual changes, nausea, vomiting, diarrhea, constipation, dizziness, abdominal pain, skin rash, fevers, chills, night sweats, weight loss, swollen lymph nodes, body aches, joint swelling, chest pain, shortness of breath, mood changes. POSITIVE muscle aches  Objective  Blood pressure 118/82, height 5' 9.5" (1.765 m), weight 215 lb (97.5 kg).   General: No apparent distress alert and oriented x3 mood and affect normal, dressed appropriately.  HEENT: Pupils equal, extraocular  movements intact  Respiratory: Patient's speak in full sentences and does not appear short of breath  Cardiovascular: No lower extremity edema, non tender, no erythema  Bilateral knee exam shows effusion noted.  Does have some limited range of motion.  Crepitus noted.  Tenderness to palpation over the right shoulder as well.  Positive impingement noted.  Seems to be the entire shoulder itself.  Right hip no more pain over the tensor fascia lata noted.  Seems to be more of a trigger point noted in the muscle belly.  After informed written and verbal consent, patient was seated on exam table. Right knee was prepped with alcohol swab and utilizing anterolateral approach, patient's right knee space was injected with 48 mg per 3 mL of Monovisc (sodium hyaluronate) in a prefilled syringe was injected easily into the knee through a 22-gauge needle..Patient tolerated the procedure well without immediate complications.  After informed written and verbal consent, patient was seated on exam table. Left knee was prepped with alcohol swab and utilizing anterolateral approach, patient's left knee space was injected with 48 mg per 3 mL of Monovisc (sodium hyaluronate) in a prefilled syringe was injected easily into the knee through a 22-gauge  needle..Patient tolerated the procedure well without immediate complications.  Procedure: Real-time Ultrasound Guided Injection of right glenohumeral joint Device: GE Logiq Q7  Ultrasound guided injection is preferred based studies that show increased duration, increased effect, greater accuracy, decreased procedural pain, increased response rate with ultrasound guided versus blind injection.  Verbal informed consent obtained.  Time-out conducted.  Noted no overlying erythema, induration, or other signs of local infection.  Skin prepped in a sterile fashion.  Local anesthesia: Topical Ethyl chloride.  With sterile technique and under real time ultrasound guidance:  Joint visualized.  23g 1  inch needle inserted posterior approach. Pictures taken for needle placement. Patient did have injection of  2 cc of 0.5% Marcaine, and 1.0 cc of Kenalog  40 mg/dL. Completed without difficulty  Pain immediately improved suggesting accurate placement of the medication.  Advised to call if fevers/chills, erythema, induration, drainage, or persistent bleeding.  Images saved Impression: Technically successful ultrasound guided injection.   After verbal consent patient was prepped with alcohol swab and with a 21-gauge 2 inch needle injected into the right tensor fascia of the left trigger point with a total of 1 cc of 0.5% Marcaine and 1 cc of Kenalog  40 mg/mL.  No blood loss, Band-Aid placed.  Postinjection instruction given.   Impression and Recommendations:    The above documentation has been reviewed and is accurate and complete Airam Runions M Abby Stines, DO

## 2024-04-06 ENCOUNTER — Encounter: Payer: Self-pay | Admitting: Family Medicine

## 2024-04-06 ENCOUNTER — Other Ambulatory Visit: Payer: Self-pay

## 2024-04-06 ENCOUNTER — Ambulatory Visit: Admitting: Family Medicine

## 2024-04-06 VITALS — BP 118/82 | Ht 69.5 in | Wt 215.0 lb

## 2024-04-06 DIAGNOSIS — M170 Bilateral primary osteoarthritis of knee: Secondary | ICD-10-CM

## 2024-04-06 DIAGNOSIS — M25512 Pain in left shoulder: Secondary | ICD-10-CM

## 2024-04-06 DIAGNOSIS — M25511 Pain in right shoulder: Secondary | ICD-10-CM | POA: Diagnosis not present

## 2024-04-06 DIAGNOSIS — M6289 Other specified disorders of muscle: Secondary | ICD-10-CM | POA: Diagnosis not present

## 2024-04-06 DIAGNOSIS — G8929 Other chronic pain: Secondary | ICD-10-CM

## 2024-04-06 MED ORDER — HYALURONAN 88 MG/4ML IX SOSY
176.0000 mg | PREFILLED_SYRINGE | Freq: Once | INTRA_ARTICULAR | Status: AC
Start: 2024-04-06 — End: 2024-04-06
  Administered 2024-04-06: 176 mg via INTRA_ARTICULAR

## 2024-04-06 NOTE — Assessment & Plan Note (Signed)
 Given injection today tolerated the procedure well, discussed icing regimen and home exercises, which activities to do and which ones to avoid.  Increase activity slowly.  Discussed icing regimen.  Follow-up again in 6 to 8 weeks otherwise.

## 2024-04-06 NOTE — Assessment & Plan Note (Addendum)
 Had more of a trigger point noted.  Patient given injection in the trigger points in the muscle.  And tolerated the procedure well, discussed icing regimen of home exercises, which activities to do and which ones to avoid.  Discussed icing regimen.  Will follow-up again in 6 to 8 weeks.

## 2024-04-06 NOTE — Assessment & Plan Note (Signed)
 Patient given injections today.  Chronic problem with worsening symptoms.  Neurological severe bone-on-bone but does seem to be responding to steroid therapy.  Discussed icing regimen and home exercises, which activities to do and which ones to avoid.  Increase activity slowly.  Discussed icing regimen.  Follow-up again in 6 to 8 weeks otherwise.

## 2024-04-06 NOTE — Patient Instructions (Addendum)
 Monovisc for both knees today Injected shoulder and TFL in hip See me again in 3 months

## 2024-04-13 DIAGNOSIS — L309 Dermatitis, unspecified: Secondary | ICD-10-CM | POA: Diagnosis not present

## 2024-04-27 ENCOUNTER — Encounter: Payer: Self-pay | Admitting: Internal Medicine

## 2024-04-27 ENCOUNTER — Ambulatory Visit (INDEPENDENT_AMBULATORY_CARE_PROVIDER_SITE_OTHER): Admitting: Internal Medicine

## 2024-04-27 VITALS — BP 114/66 | HR 78 | Temp 98.5°F | Ht 69.5 in | Wt 212.0 lb

## 2024-04-27 DIAGNOSIS — R21 Rash and other nonspecific skin eruption: Secondary | ICD-10-CM | POA: Insufficient documentation

## 2024-04-27 DIAGNOSIS — H0011 Chalazion right upper eyelid: Secondary | ICD-10-CM | POA: Insufficient documentation

## 2024-04-27 MED ORDER — DOXYCYCLINE HYCLATE 100 MG PO TABS: 100.0000 mg | ORAL_TABLET | Freq: Two times a day (BID) | ORAL | 0 refills | Status: DC

## 2024-04-27 NOTE — Assessment & Plan Note (Signed)
 Not clear cut Didn't respond to nystatin/TAC Has inflamed look and black stuff---?infectious Will try doxycycline 100 bid (7 day Rx but 3-4 days may be enough)

## 2024-04-27 NOTE — Progress Notes (Signed)
 Subjective:    Patient ID: James [name], male    DOB: [birth year], 68 y.o.   MRN: [id number]  HPI Here due to rash  Noticed an irritated hair in left axilla about 2 weeks ago Then saw black spot there Went to urgent care--got nystatin/TAC cream which didn't help He used baking soda--seemed to help some of the scab come off---but still had "putty like' area  Also has smaller area in right side  Changed antiperspirant about 2 months ago Roll on   Current Outpatient Medications on File Prior to Visit  Medication Sig Dispense Refill   aspirin EC 81 MG tablet Take 81 mg by mouth daily.     atorvastatin  (LIPITOR) 80 MG tablet Take 1 tablet (80 mg total) by mouth daily. 90 tablet 3   fluconazole  (DIFLUCAN ) 150 MG tablet Take 1 tablet (150 mg total) by mouth once a week. 4 tablet 5   gabapentin  (NEURONTIN ) 100 MG capsule Take 2 capsules (200 mg total) by mouth at bedtime. 180 capsule 0   nitroGLYCERIN  (NITROSTAT ) 0.4 MG SL tablet Place 1 tablet (0.4 mg total) under the tongue every 5 (five) minutes as needed for chest pain. 25 tablet 1   selenium  sulfide (SELSUN ) 2.5 % lotion Apply 1 Application topically once a week. 118 mL 12   triamcinolone  cream (KENALOG ) 0.1 % Apply 1 Application topically 2 (two) times daily as needed. (Patient not taking: Reported on 04/27/2024) 45 g 1   No current facility-administered medications on file prior to visit.    No Known Allergies  Past Medical History:  Diagnosis Date   Adenomatous colon polyp 2010   CAD (coronary artery disease)    Cataract    Duodenal ulcer due to Helicobacter pylori 09/09/2017   Gastric ulcer due to Helicobacter pylori 07/31/2017   Hx of adenomatous colonic polyps 01/26/2009   Hyperlipidemia    MI (myocardial infarction) (HCC)    Tinea versicolor     Past Surgical History:  Procedure Laterality Date   COLONOSCOPY     COLONOSCOPY W/ POLYPECTOMY     CORONARY STENT PLACEMENT     at [facility]   jaw tumor Left     POLYPECTOMY     TONSILLECTOMY  1962    Family History  Problem Relation Age of Onset   Colon cancer Neg Hx    Stomach cancer Neg Hx    Esophageal cancer Neg Hx    Pancreatic cancer Neg Hx    Rectal cancer Neg Hx     Social History   Socioeconomic History   Marital status: Married    Spouse name: Not on file   Number of children: 2   Years of education: Not on file   Highest education level: Not on file  Occupational History   Occupation: Wellsite geologist    Employer: [employer]    Comment: Retired  Tobacco Use   Smoking status: Former    Current packs/day: 0.00    Types: Cigarettes    Quit date: 11/24/1978    Years since quitting: 45.4    Passive exposure: Past   Smokeless tobacco: Never  Substance and Sexual Activity   Alcohol use: Yes    Comment: 1 to 2 beers a month   Drug use: No   Sexual activity: Not on file  Other Topics Concern   Not on file  Social History Narrative   Former minor league shortstop      Has living will   Wife is  health care POA--children would be alternates   Would accept resuscitation   No tube feeds if cognitively unaware   Social Drivers of Health   Financial Resource Strain: Low Risk  (04/25/2024)   Overall Financial Resource Strain (CARDIA)    Difficulty of Paying Living Expenses: Not hard at all  Food Insecurity: No Food Insecurity (04/25/2024)   Hunger Vital Sign    Worried About Running Out of Food in the Last Year: Never true    Ran Out of Food in the Last Year: Never true  Transportation Needs: No Transportation Needs (04/25/2024)   PRAPARE - Administrator, Civil Service (Medical): No    Lack of Transportation (Non-Medical): No  Physical Activity: Insufficiently Active (04/25/2024)   Exercise Vital Sign    Days of Exercise per Week: 1 day    Minutes of Exercise per Session: 10 min  Stress: No Stress Concern Present (04/25/2024)   [employer] of Occupational Health - Occupational Stress  Questionnaire    Feeling of Stress : Not at all  Social Connections: Moderately Isolated (04/25/2024)   Social Connection and Isolation Panel [NHANES]    Frequency of Communication with Friends and Family: Once a week    Frequency of Social Gatherings with Friends and Family: Once a week    Attends Religious Services: 1 to 4 times per year    Active Member of [employer] or Organizations: No    Attends Engineer, structural: Not on file    Marital Status: Married  Catering manager Violence: Not At Risk (07/21/2023)   Received from [facility]   HITS    Over the last 12 months how often did your partner physically hurt you?: Never    Over the last 12 months how often did your partner insult you or talk down to you?: Never    Over the last 12 months how often did your partner threaten you with physical harm?: Never    Over the last 12 months how often did your partner scream or curse at you?: Never   Review of Systems No fever Exercises regularly--has lost ~10# Also mass in right upper eyelid--variable size    Objective:   Physical Exam Constitutional:      Appearance: Normal appearance.  Eyes:     Comments: 1-2 mm non inflamed mass in mid right upper eyelid  Skin:    Comments: Redness over posterior axillary area on left (much smaller on right) No apparent pus now No swelling  Neurological:     Mental Status: He is alert.            Assessment & Plan:

## 2024-04-27 NOTE — Assessment & Plan Note (Signed)
 Small and not particularly painful Discussed seeing ophthal for removal if bothers him

## 2024-05-14 DIAGNOSIS — H5789 Other specified disorders of eye and adnexa: Secondary | ICD-10-CM | POA: Diagnosis not present

## 2024-05-25 ENCOUNTER — Encounter: Payer: Self-pay | Admitting: Internal Medicine

## 2024-05-25 DIAGNOSIS — L820 Inflamed seborrheic keratosis: Secondary | ICD-10-CM | POA: Diagnosis not present

## 2024-05-25 DIAGNOSIS — B360 Pityriasis versicolor: Secondary | ICD-10-CM | POA: Diagnosis not present

## 2024-05-25 DIAGNOSIS — L304 Erythema intertrigo: Secondary | ICD-10-CM | POA: Diagnosis not present

## 2024-06-24 ENCOUNTER — Ambulatory Visit

## 2024-06-24 VITALS — Ht 69.5 in | Wt 215.0 lb

## 2024-06-24 DIAGNOSIS — Z860100 Personal history of colon polyps, unspecified: Secondary | ICD-10-CM

## 2024-06-24 MED ORDER — NA SULFATE-K SULFATE-MG SULF 17.5-3.13-1.6 GM/177ML PO SOLN
1.0000 | Freq: Once | ORAL | 0 refills | Status: AC
Start: 2024-06-24 — End: 2024-06-24

## 2024-06-24 NOTE — Progress Notes (Signed)
 No egg or soy allergy known to patient  No issues known to pt with past sedation with any surgeries or procedures Patient denies ever being told they had issues or difficulty with intubation  No FH of Malignant Hyperthermia Pt is not on diet pills Pt is not on  home 02  Pt is not on blood thinners  Pt denies issues with constipation  No A fib or A flutter Have any cardiac testing pending--No Pt can ambulate  Pt denies use of chewing tobacco Discussed diabetic I weight loss medication holds Discussed NSAID holds Checked BMI Pt instructed to use Singlecare.com or GoodRx for a price reduction on prep  Patient's chart reviewed.  Pre visit completed

## 2024-06-27 ENCOUNTER — Encounter: Payer: Self-pay | Admitting: Internal Medicine

## 2024-07-05 NOTE — Progress Notes (Unsigned)
 [name] [address] [place] [address] Phone: [phone] Subjective:    I'm seeing this patient by the request  of:  James Charlie FERNS, MD  CC: right shoulder pain, knee pain   YEP:Dlagzrupcz  04/06/2024 Had more of a trigger point noted.  Patient given injection in the trigger points in the muscle.  And tolerated the procedure well, discussed icing regimen of home exercises, which activities to do and which ones to avoid.  Discussed icing regimen.  Will follow-up again in 6 to 8 weeks.   Given injection today tolerated the procedure well, discussed icing regimen and home exercises, which activities to do and which ones to avoid. Increase activity slowly. Discussed icing regimen. Follow-up again in 6 to 8 weeks otherwise.   Patient given injections today. Chronic problem with worsening symptoms. Neurological severe bone-on-bone but does seem to be responding to steroid therapy. Discussed icing regimen and home exercises, which activities to do and which ones to avoid. Increase activity slowly. Discussed icing regimen. Follow-up again in 6 to 8 weeks otherwise.   Update 07/12/2024 James [name] is a 68 y.o. male coming in with complaint of R shoulder, hip, and B knee pain. Patient states improvement of right shoulder and left knee sx. Notes new pain in the left shoulder, limited ROM with overhead reaching. Denies radicular sx. Notes R>L knee pain. Swelling present, worse with knee flexion. Some improvement with movement.      Past Medical History:  Diagnosis Date   Adenomatous colon polyp 2010   CAD (coronary artery disease)    Cataract    Duodenal ulcer due to Helicobacter pylori 09/09/2017   Gastric ulcer due to Helicobacter pylori 07/31/2017   Hx of adenomatous colonic polyps 01/26/2009   Hyperlipidemia    MI (myocardial infarction) (HCC)    Tinea versicolor    Past Surgical History:  Procedure Laterality Date   COLONOSCOPY     COLONOSCOPY  W/ POLYPECTOMY     CORONARY STENT PLACEMENT     at [facility]   jaw tumor Left    POLYPECTOMY     TONSILLECTOMY  1962   Social History   Socioeconomic History   Marital status: Married    Spouse name: Not on file   Number of children: 2   Years of education: Not on file   Highest education level: Not on file  Occupational History   Occupation: Wellsite geologist    Employer: [employer]    Comment: Retired  Tobacco Use   Smoking status: Former    Current packs/day: 0.00    Types: Cigarettes    Quit date: 11/24/1978    Years since quitting: 45.6    Passive exposure: Past   Smokeless tobacco: Never  Vaping Use   Vaping status: Never Used  Substance and Sexual Activity   Alcohol use: Yes    Comment: 1 to 2 beers a month   Drug use: No   Sexual activity: Not on file  Other Topics Concern   Not on file  Social History Narrative   Former minor league shortstop      Has living will   Wife is health care POA--children would be alternates   Would accept resuscitation   No tube feeds if cognitively unaware   Social Drivers of Health   Financial Resource Strain: Low Risk  (04/25/2024)   Overall Financial Resource Strain (CARDIA)    Difficulty of Paying Living Expenses: Not hard at all  Food Insecurity: No Food Insecurity (04/25/2024)   Hunger Vital Sign    Worried About Running Out of Food in the Last Year: Never true    Ran Out of Food in the Last Year: Never true  Transportation Needs: No Transportation Needs (04/25/2024)   PRAPARE - Administrator, Civil Service (Medical): No    Lack of Transportation (Non-Medical): No  Physical Activity: Insufficiently Active (04/25/2024)   Exercise Vital Sign    Days of Exercise per Week: 1 day    Minutes of Exercise per Session: 10 min  Stress: No Stress Concern Present (04/25/2024)   [employer] of Occupational Health - Occupational Stress Questionnaire    Feeling of Stress : Not at all  Social  Connections: Moderately Isolated (04/25/2024)   Social Connection and Isolation Panel    Frequency of Communication with Friends and Family: Once a week    Frequency of Social Gatherings with Friends and Family: Once a week    Attends Religious Services: 1 to 4 times per year    Active Member of [employer] or Organizations: No    Attends Engineer, structural: Not on file    Marital Status: Married   No Known Allergies Family History  Problem Relation Age of Onset   Colon cancer Neg Hx    Stomach cancer Neg Hx    Esophageal cancer Neg Hx    Pancreatic cancer Neg Hx    Rectal cancer Neg Hx      Current Outpatient Medications (Cardiovascular):    atorvastatin  (LIPITOR) 80 MG tablet, Take 1 tablet (80 mg total) by mouth daily.   nitroGLYCERIN  (NITROSTAT ) 0.4 MG SL tablet, Place 1 tablet (0.4 mg total) under the tongue every 5 (five) minutes as needed for chest pain.   Current Outpatient Medications (Analgesics):    aspirin EC 81 MG tablet, Take 81 mg by mouth daily.   Current Outpatient Medications (Other):    fluconazole  (DIFLUCAN ) 150 MG tablet, Take 1 tablet (150 mg total) by mouth once a week. (Patient not taking: Reported on 06/24/2024)   ketoconazole (NIZORAL) 2 % cream, Apply topically.   selenium  sulfide (SELSUN ) 2.5 % lotion, Apply 1 Application topically once a week. (Patient not taking: Reported on 06/24/2024)   triamcinolone  cream (KENALOG ) 0.1 %, Apply 1 Application topically 2 (two) times daily as needed. (Patient not taking: Reported on 06/24/2024)   Reviewed prior external information including notes and imaging from  primary care provider As well as notes that were available from care everywhere and other healthcare systems.  Past medical history, social, surgical and family history all reviewed in electronic medical record.  No pertanent information unless stated regarding to the chief complaint.   Review of Systems:  No headache, visual changes, nausea, vomiting,  diarrhea, constipation, dizziness, abdominal pain, skin rash, fevers, chills, night sweats, weight loss, swollen lymph nodes, body aches, joint swelling, chest pain, shortness of breath, mood changes. POSITIVE muscle aches  Objective  There were no vitals taken for this visit.   General: No apparent distress alert and oriented x3 mood and affect normal, dressed appropriately.  HEENT: Pupils equal, extraocular movements intact  Respiratory: Patient's speak in full sentences and does not appear short of breath  Cardiovascular: No lower extremity edema, non tender, no erythema  Left shoulder exam shows patient does have a positive impingement noted.  Seems to be more on the left acromioclavicular joint.   Knee exam shows right knee does have some swelling of the  calf noted.  Seems to be more of the tenderness to palpation on the popliteal area of the knee.  The patient's knee does have arthritic changes noted.  After informed written and verbal consent, patient was seated on exam table. Right knee was prepped with alcohol swab and utilizing anterolateral approach, patient's right knee space was injected with 4:1  marcaine 0.5%: Kenalog  40mg /dL. Patient tolerated the procedure well without immediate complications.  Procedure: Real-time Ultrasound Guided Injection of left acromioclavicular joint Device: GE Logiq Q7 Ultrasound guided injection is preferred based studies that show increased duration, increased effect, greater accuracy, decreased procedural pain, increased response rate, and decreased cost with ultrasound guided versus blind injection.  Verbal informed consent obtained.  Time-out conducted.  Noted no overlying erythema, induration, or other signs of local infection.  Skin prepped in a sterile fashion.  Local anesthesia: Topical Ethyl chloride.  With sterile technique and under real time ultrasound guidance: With a 25-gauge half inch needle injected with 0.5 cc of 0.5% Marcaine and  0.5 cc of Kenalog  40 mg/mL Completed without difficulty  Pain immediately resolved suggesting accurate placement of the medication.  Advised to call if fevers/chills, erythema, induration, drainage, or persistent bleeding. Images saved Impression: Technically successful ultrasound guided injection.   Impression and Recommendations:     The above documentation has been reviewed and is accurate and complete Gracelyn Coventry M Maxemiliano Riel, DO

## 2024-07-07 NOTE — Progress Notes (Signed)
 [facility] History and Physical   Primary Care Physician:  [name], MD   Reason for Procedure:    Encounter Diagnosis  Name Primary?   Hx of adenomatous colonic polyps Yes     Plan:    colonoscopy     HPI: James [name] is a 68 y.o. male here for surveillance colonoscopy.  Polyp hx: 2010 multiple adenomas largest 2.3 cm TV adenoma on stalk w/ focal high-grade dysplasia 2015 4 polyps - all adenomas max 9 mm 10/2017 4 dimin adenomas  11/12/2020 3 diminutive adenomas recall 2024  Past Medical History:  Diagnosis Date   Adenomatous colon polyp 2010   CAD (coronary artery disease)    Cataract    Duodenal ulcer due to Helicobacter pylori 09/09/2017   Gastric ulcer due to Helicobacter pylori 07/31/2017   Hx of adenomatous colonic polyps 01/26/2009   Hyperlipidemia    MI (myocardial infarction) (HCC)    Tinea versicolor     Past Surgical History:  Procedure Laterality Date   COLONOSCOPY     COLONOSCOPY W/ POLYPECTOMY     CORONARY STENT PLACEMENT     at [facility]   jaw tumor Left    POLYPECTOMY     TONSILLECTOMY  1962     Current Outpatient Medications  Medication Sig Dispense Refill   aspirin EC 81 MG tablet Take 81 mg by mouth daily.     atorvastatin  (LIPITOR) 80 MG tablet Take 1 tablet (80 mg total) by mouth daily. 90 tablet 3   ketoconazole (NIZORAL) 2 % cream Apply topically.     Na Sulfate-K Sulfate-Mg Sulfate concentrate (SUPREP) 17.5-3.13-1.6 GM/177ML SOLN as directed.     fluconazole  (DIFLUCAN ) 150 MG tablet Take 1 tablet (150 mg total) by mouth once a week. (Patient not taking: Reported on 06/24/2024) 4 tablet 5   nitroGLYCERIN  (NITROSTAT ) 0.4 MG SL tablet Place 1 tablet (0.4 mg total) under the tongue every 5 (five) minutes as needed for chest pain. 25 tablet 1   selenium  sulfide (SELSUN ) 2.5 % lotion Apply 1 Application topically once a week. (Patient not taking: Reported on 06/24/2024) 118 mL 12   triamcinolone  cream (KENALOG ) 0.1 %  Apply 1 Application topically 2 (two) times daily as needed. (Patient not taking: Reported on 06/24/2024) 45 g 1   Current Facility-Administered Medications  Medication Dose Route Frequency Provider Last Rate Last Admin   0.9 %  sodium chloride  infusion  500 mL Intravenous Once Avram Lupita BRAVO, MD        Allergies as of 07/08/2024   (No Known Allergies)    Family History  Problem Relation Age of Onset   Colon cancer Neg Hx    Stomach cancer Neg Hx    Esophageal cancer Neg Hx    Pancreatic cancer Neg Hx    Rectal cancer Neg Hx     Social History   Socioeconomic History   Marital status: Married    Spouse name: Not on file   Number of children: 2   Years of education: Not on file   Highest education level: Not on file  Occupational History   Occupation: Wellsite geologist    Employer: [employer]    Comment: Retired  Tobacco Use   Smoking status: Former    Current packs/day: 0.00    Types: Cigarettes    Quit date: 11/24/1978    Years since quitting: 45.6    Passive exposure: Past   Smokeless tobacco: Never  Vaping Use   Vaping  status: Never Used  Substance and Sexual Activity   Alcohol use: Yes    Comment: 1 to 2 beers a month   Drug use: No   Sexual activity: Not on file  Other Topics Concern   Not on file  Social History Narrative   Former minor league shortstop      Has living will   Wife is health care POA--children would be alternates   Would accept resuscitation   No tube feeds if cognitively unaware   Social Drivers of Health   Financial Resource Strain: Low Risk  (04/25/2024)   Overall Financial Resource Strain (CARDIA)    Difficulty of Paying Living Expenses: Not hard at all  Food Insecurity: No Food Insecurity (04/25/2024)   Hunger Vital Sign    Worried About Running Out of Food in the Last Year: Never true    Ran Out of Food in the Last Year: Never true  Transportation Needs: No Transportation Needs (04/25/2024)   PRAPARE -  Administrator, Civil Service (Medical): No    Lack of Transportation (Non-Medical): No  Physical Activity: Insufficiently Active (04/25/2024)   Exercise Vital Sign    Days of Exercise per Week: 1 day    Minutes of Exercise per Session: 10 min  Stress: No Stress Concern Present (04/25/2024)   [employer] of Occupational Health - Occupational Stress Questionnaire    Feeling of Stress : Not at all  Social Connections: Moderately Isolated (04/25/2024)   Social Connection and Isolation Panel    Frequency of Communication with Friends and Family: Once a week    Frequency of Social Gatherings with Friends and Family: Once a week    Attends Religious Services: 1 to 4 times per year    Active Member of [employer] or Organizations: No    Attends Engineer, structural: Not on file    Marital Status: Married  Catering manager Violence: Not At Risk (07/21/2023)   Received from [facility]   HITS    Over the last 12 months how often did your partner physically hurt you?: Never    Over the last 12 months how often did your partner insult you or talk down to you?: Never    Over the last 12 months how often did your partner threaten you with physical harm?: Never    Over the last 12 months how often did your partner scream or curse at you?: Never    Review of Systems:  All other review of systems negative except as mentioned in the HPI.  Physical Exam: Vital signs BP (!) 150/86   Pulse (!) 54   Temp 97.9 F (36.6 C) (Temporal)   Ht 5' 9.5 (1.765 m)   Wt 215 lb (97.5 kg)   SpO2 98%   BMI 31.29 kg/m   General:   Alert,  Well-developed, well-nourished, pleasant and cooperative in NAD Lungs:  Clear throughout to auscultation.   Heart:  Regular rate and rhythm; no murmurs, clicks, rubs,  or gallops. Abdomen:  Soft, nontender and nondistended. Normal bowel sounds.   Neuro/Psych:  Alert and cooperative. Normal mood and affect. A and O x 3   @Colson Barco  CHARLENA Commander, MD,  [facility] [phone] (pager) 07/08/2024 1:09 PM@

## 2024-07-08 ENCOUNTER — Ambulatory Visit (AMBULATORY_SURGERY_CENTER): Admitting: Internal Medicine

## 2024-07-08 ENCOUNTER — Encounter: Payer: Self-pay | Admitting: Internal Medicine

## 2024-07-08 VITALS — BP 111/74 | HR 60 | Temp 97.9°F | Resp 11 | Ht 69.5 in | Wt 215.0 lb

## 2024-07-08 DIAGNOSIS — Z860101 Personal history of adenomatous and serrated colon polyps: Secondary | ICD-10-CM | POA: Diagnosis not present

## 2024-07-08 DIAGNOSIS — D123 Benign neoplasm of transverse colon: Secondary | ICD-10-CM

## 2024-07-08 DIAGNOSIS — Z1211 Encounter for screening for malignant neoplasm of colon: Secondary | ICD-10-CM

## 2024-07-08 DIAGNOSIS — I2510 Atherosclerotic heart disease of native coronary artery without angina pectoris: Secondary | ICD-10-CM | POA: Diagnosis not present

## 2024-07-08 DIAGNOSIS — I252 Old myocardial infarction: Secondary | ICD-10-CM | POA: Diagnosis not present

## 2024-07-08 MED ORDER — SODIUM CHLORIDE 0.9 % IV SOLN: 500.0000 mL | Freq: Once | INTRAVENOUS | Status: DC

## 2024-07-08 NOTE — Progress Notes (Signed)
 Pt's states no medical or surgical changes since previsit or office visit.

## 2024-07-08 NOTE — Patient Instructions (Addendum)
 One tiny polyp found and removed.  All else normal.  Your next routine colonoscopy should be in 5 years - 2030.  I appreciate the opportunity to care for you. Lupita CHARLENA Commander, MD, [facility]  Resume previous diet Continue present medications Await pathology results  Handouts/information given for polyps  YOU HAD AN ENDOSCOPIC PROCEDURE TODAY AT THE [facility]:   Refer to the procedure report that was given to you for any specific questions about what was found during the examination.  If the procedure report does not answer your questions, please call your gastroenterologist to clarify.  If you requested that your care partner not be given the details of your procedure findings, then the procedure report has been included in a sealed envelope for you to review at your convenience later.  YOU SHOULD EXPECT: Some feelings of bloating in the abdomen. Passage of more gas than usual.  Walking can help get rid of the air that was put into your GI tract during the procedure and reduce the bloating. If you had a lower endoscopy (such as a colonoscopy or flexible sigmoidoscopy) you may notice spotting of blood in your stool or on the toilet paper. If you underwent a bowel prep for your procedure, you may not have a normal bowel movement for a few days.  Please Note:  You might notice some irritation and congestion in your nose or some drainage.  This is from the oxygen used during your procedure.  There is no need for concern and it should clear up in a day or so.  SYMPTOMS TO REPORT IMMEDIATELY:  Following lower endoscopy (colonoscopy):  Excessive amounts of blood in the stool  Significant tenderness or worsening of abdominal pains  Swelling of the abdomen that is new, acute  Fever of 100F or higher For urgent or emergent issues, a gastroenterologist can be reached at any hour by calling [phone]. Do not use MyChart messaging for urgent concerns.   DIET:  We do recommend a small  meal at first, but then you may proceed to your regular diet.  Drink plenty of fluids but you should avoid alcoholic beverages for 24 hours.  ACTIVITY:  You should plan to take it easy for the rest of today and you should NOT DRIVE or use heavy machinery until tomorrow (because of the sedation medicines used during the test).    FOLLOW UP: Our staff will call the number listed on your records the next business day following your procedure.  We will call around 7:15- 8:00 am to check on you and address any questions or concerns that you may have regarding the information given to you following your procedure. If we do not reach you, we will leave a message.     If any biopsies were taken you will be contacted by phone or by letter within the next 1-3 weeks.  Please call us  at [phone] if you have not heard about the biopsies in 3 weeks.    SIGNATURES/CONFIDENTIALITY: You and/or your care partner have signed paperwork which will be entered into your electronic medical record.  These signatures attest to the fact that that the information above on your After Visit Summary has been reviewed and is understood.  Full responsibility of the confidentiality of this discharge information lies with you and/or your care-partner.

## 2024-07-08 NOTE — Progress Notes (Signed)
 To pacu, VSS. Report to [email or web address]

## 2024-07-08 NOTE — Op Note (Signed)
 [facility] Patient Name: James [name] Procedure Date: 07/08/2024 12:58 PM MRN: [id number] Endoscopist: Lupita FORBES Commander , MD, [id number] Age: 68 Referring MD:  Date of Birth: [birth year] Gender: Male Account #: [id number] Procedure:                Colonoscopy Indications:              Surveillance: Personal history of colonic polyps                            (unknown histology) on last colonoscopy 5 years ago Medicines:                Monitored Anesthesia Care Procedure:                Pre-Anesthesia Assessment:                           - Prior to the procedure, a History and Physical                            was performed, and patient medications and                            allergies were reviewed. The patient's tolerance of                            previous anesthesia was also reviewed. The risks                            and benefits of the procedure and the sedation                            options and risks were discussed with the patient.                            All questions were answered, and informed consent                            was obtained. Prior Anticoagulants: The patient has                            taken no anticoagulant or antiplatelet agents. ASA                            Grade Assessment: II - A patient with mild systemic                            disease. After reviewing the risks and benefits,                            the patient was deemed in satisfactory condition to                            undergo the procedure.  After obtaining informed consent, the colonoscope                            was passed under direct vision. Throughout the                            procedure, the patient's blood pressure, pulse, and                            oxygen saturations were monitored continuously. The                            Olympus Scope SN: [id number] was introduced through                            the anus  and advanced to the the cecum, identified                            by appendiceal orifice and ileocecal valve. The                            ileocecal valve, appendiceal orifice, and rectum                            were photographed. The colonoscopy was performed                            without difficulty. The patient tolerated the                            procedure well. The quality of the bowel                            preparation was good. The bowel preparation used                            was SUPREP via split dose instruction. Scope In: 1:24:23 PM Scope Out: 1:44:01 PM Scope Withdrawal Time: 0 hours 13 minutes 5 seconds  Total Procedure Duration: 0 hours 19 minutes 38 seconds  Findings:                 The perianal and digital rectal examinations were                            normal. Pertinent negatives include normal prostate                            (size, shape, and consistency).                           A 5 mm polyp was found in the transverse colon. The                            polyp was sessile. The polyp was removed with  a                            cold snare. Resection and retrieval were complete.                            Verification of patient identification for the                            specimen was done. Estimated blood loss was minimal.                           The exam was otherwise without abnormality on                            direct and retroflexion views. Complications:            No immediate complications. Estimated Blood Loss:     Estimated blood loss was minimal. Impression:               - One 5 mm polyp in the transverse colon, removed                            with a cold snare. Resected and retrieved.                           - The examination was otherwise normal on direct                            and retroflexion views.                           - Personal history of colonic polyps.                           2010  multiple adenomas largest 2.3 cm TV adenoma on                            stalk w/ focal high-grade dysplasia                           2015 4 polyps - all adenomas max 9 mm                           10/2017 4 dimin adenomas                           11/12/2020 3 diminutive adenomas Recommendation:           - Patient has a contact number available for                            emergencies. The signs and symptoms of potential                            delayed complications were discussed with the  patient. Return to normal activities tomorrow.                            Written discharge instructions were provided to the                            patient.                           - Resume previous diet.                           - Continue present medications.                           - Await pathology results.                           - Repeat colonoscopy in 5 years for surveillance. Lupita FORBES Commander, MD 07/08/2024 1:51:12 PM This report has been signed electronically.

## 2024-07-08 NOTE — Progress Notes (Signed)
 Called to room to assist during endoscopic procedure.  Patient ID and intended procedure confirmed with present staff. Received instructions for my participation in the procedure from the performing physician.

## 2024-07-11 ENCOUNTER — Telehealth: Payer: Self-pay | Admitting: *Deleted

## 2024-07-11 NOTE — Telephone Encounter (Signed)
 No answer after post call. Left a message.

## 2024-07-12 ENCOUNTER — Other Ambulatory Visit: Payer: Self-pay

## 2024-07-12 ENCOUNTER — Ambulatory Visit: Admitting: Family Medicine

## 2024-07-12 ENCOUNTER — Encounter: Payer: Self-pay | Admitting: Family Medicine

## 2024-07-12 VITALS — BP 130/82 | HR 77 | Ht 69.5 in | Wt 216.0 lb

## 2024-07-12 DIAGNOSIS — M7121 Synovial cyst of popliteal space [Baker], right knee: Secondary | ICD-10-CM | POA: Diagnosis not present

## 2024-07-12 DIAGNOSIS — M19012 Primary osteoarthritis, left shoulder: Secondary | ICD-10-CM

## 2024-07-12 DIAGNOSIS — M25561 Pain in right knee: Secondary | ICD-10-CM

## 2024-07-12 DIAGNOSIS — G8929 Other chronic pain: Secondary | ICD-10-CM | POA: Diagnosis not present

## 2024-07-12 DIAGNOSIS — M25512 Pain in left shoulder: Secondary | ICD-10-CM

## 2024-07-12 NOTE — Assessment & Plan Note (Signed)
 Has had on the right knee, unfortunately did have a bit burst recently.  Seems to be continuing draining.  Given an injection to decrease this.  Increase activity slowly.  Discussed icing regimen follow-up again in 6 to 8 weeks otherwise.

## 2024-07-12 NOTE — Patient Instructions (Addendum)
 Thank you for coming in today.  Injected shoulder and knee today See me again in 3-4 months

## 2024-07-12 NOTE — Assessment & Plan Note (Signed)
 Given injection and tolerated the procedure well, discussed icing regimen and home exercises Worsening back pain noted.  Increase activity slowly.  Follow-up again in 6 to 8 weeks.  Patient also avoid any surgical intervention and I think he will be able to hopefully.  Follow-up again in 6 to 8 weeks otherwise.

## 2024-07-13 LAB — SURGICAL PATHOLOGY

## 2024-07-16 ENCOUNTER — Ambulatory Visit: Payer: Self-pay | Admitting: Internal Medicine

## 2024-07-16 DIAGNOSIS — Z860101 Personal history of adenomatous and serrated colon polyps: Secondary | ICD-10-CM

## 2024-09-09 ENCOUNTER — Encounter: Payer: Self-pay | Admitting: Family Medicine

## 2024-09-09 ENCOUNTER — Ambulatory Visit (INDEPENDENT_AMBULATORY_CARE_PROVIDER_SITE_OTHER): Admitting: Family Medicine

## 2024-09-09 ENCOUNTER — Ambulatory Visit: Admitting: Student in an Organized Health Care Education/Training Program

## 2024-09-09 VITALS — BP 136/74 | HR 74 | Temp 98.4°F | Ht 69.5 in | Wt 216.2 lb

## 2024-09-09 DIAGNOSIS — H029 Unspecified disorder of eyelid: Secondary | ICD-10-CM

## 2024-09-09 DIAGNOSIS — I2510 Atherosclerotic heart disease of native coronary artery without angina pectoris: Secondary | ICD-10-CM

## 2024-09-09 DIAGNOSIS — B360 Pityriasis versicolor: Secondary | ICD-10-CM

## 2024-09-09 DIAGNOSIS — Z7189 Other specified counseling: Secondary | ICD-10-CM

## 2024-09-09 MED ORDER — NITROGLYCERIN 0.4 MG SL SUBL: 0.4000 mg | SUBLINGUAL_TABLET | SUBLINGUAL | 1 refills | Status: AC | PRN

## 2024-09-09 MED ORDER — FLUCONAZOLE 150 MG PO TABS: 150.0000 mg | ORAL_TABLET | ORAL | 5 refills | Status: AC

## 2024-09-09 NOTE — Patient Instructions (Addendum)
 No change for the visit.  Let me know if you don't get a call about seeing the eye clinic at [facility].  Take care.  Glad to see you.  Ask the front about getting a yearly visit scheduled for the spring.

## 2024-09-09 NOTE — Progress Notes (Unsigned)
 No NTG use but needed refill.  No CP.  Prescription sent.  Using ketoconazole prn.  It keeps tinea in check.  He can take it for about 4 weeks go 1-4 months between courses.  He needed refill.  Wife is health care POA--children would be alternates  He needed evaluation for lesion on the medial side of the upper right eyelid.  Gradually enlarging.  Has not drained.  He does not feel it on the underside of his eyelid.  No vision changes.  Meds, vitals, and allergies reviewed.   ROS: Per HPI unless specifically indicated in ROS section   Nad [place] Neck supple, no LA 5mm fleshy papule on the medial upper R eyelid  EOMI PERRL Conjunctiva and eyelids normal. Rrr Ctab

## 2024-09-11 DIAGNOSIS — H029 Unspecified disorder of eyelid: Secondary | ICD-10-CM | POA: Insufficient documentation

## 2024-09-11 NOTE — Assessment & Plan Note (Signed)
 Wife is health care POA--children would be alternates

## 2024-09-11 NOTE — Assessment & Plan Note (Signed)
 Continue treatment as needed with ketoconazole.

## 2024-09-11 NOTE — Assessment & Plan Note (Addendum)
 5mm fleshy papule on the medial upper R eyelid that has been gradually enlarging over the last months.  I need ophthalmology input.  Referral placed.  This does not appear cancerous but I want  their input.  No intervention, no charge for visit.

## 2024-09-11 NOTE — Assessment & Plan Note (Signed)
 No NTG use but needed refill.  No CP.  Prescription sent.

## 2024-09-22 ENCOUNTER — Encounter: Payer: Self-pay | Admitting: *Deleted

## 2025-01-31 ENCOUNTER — Encounter: Admitting: Family Medicine
